# Patient Record
Sex: Female | Born: 1990 | Hispanic: Yes | Marital: Married | Smoking: Former smoker
Health system: Southern US, Community
[De-identification: ages and names within clinical notes are randomized; demographics above are authoritative.]

## PROBLEM LIST (undated history)

## (undated) ENCOUNTER — Inpatient Hospital Stay (HOSPITAL_COMMUNITY): Payer: Self-pay

## (undated) ENCOUNTER — Ambulatory Visit: Payer: No Typology Code available for payment source | Source: Home / Self Care

## (undated) DIAGNOSIS — Q513 Bicornate uterus: Secondary | ICD-10-CM

## (undated) DIAGNOSIS — N83209 Unspecified ovarian cyst, unspecified side: Secondary | ICD-10-CM

## (undated) DIAGNOSIS — S72009A Fracture of unspecified part of neck of unspecified femur, initial encounter for closed fracture: Secondary | ICD-10-CM

## (undated) DIAGNOSIS — D649 Anemia, unspecified: Secondary | ICD-10-CM

## (undated) DIAGNOSIS — I959 Hypotension, unspecified: Secondary | ICD-10-CM

## (undated) HISTORY — DX: Fracture of unspecified part of neck of unspecified femur, initial encounter for closed fracture: S72.009A

## (undated) HISTORY — DX: Bicornate uterus: Q51.3

---

## 2013-03-26 DIAGNOSIS — S72009A Fracture of unspecified part of neck of unspecified femur, initial encounter for closed fracture: Secondary | ICD-10-CM

## 2013-03-26 HISTORY — DX: Fracture of unspecified part of neck of unspecified femur, initial encounter for closed fracture: S72.009A

## 2013-03-26 HISTORY — PX: HIP FRACTURE SURGERY: SHX118

## 2017-11-18 ENCOUNTER — Inpatient Hospital Stay (HOSPITAL_COMMUNITY): Payer: Self-pay

## 2017-11-18 ENCOUNTER — Other Ambulatory Visit: Payer: Self-pay

## 2017-11-18 ENCOUNTER — Inpatient Hospital Stay (HOSPITAL_COMMUNITY)
Admission: AD | Admit: 2017-11-18 | Discharge: 2017-11-18 | Disposition: A | Discharge status: Home / Self Care | Payer: Self-pay | Source: Ambulatory Visit | Attending: Obstetrics and Gynecology | Admitting: Obstetrics and Gynecology

## 2017-11-18 ENCOUNTER — Encounter (HOSPITAL_COMMUNITY): Payer: Self-pay | Admitting: *Deleted

## 2017-11-18 DIAGNOSIS — Z87891 Personal history of nicotine dependence: Secondary | ICD-10-CM | POA: Insufficient documentation

## 2017-11-18 DIAGNOSIS — O3401 Maternal care for unspecified congenital malformation of uterus, first trimester: Secondary | ICD-10-CM | POA: Insufficient documentation

## 2017-11-18 DIAGNOSIS — Z833 Family history of diabetes mellitus: Secondary | ICD-10-CM | POA: Insufficient documentation

## 2017-11-18 DIAGNOSIS — O210 Mild hyperemesis gravidarum: Secondary | ICD-10-CM | POA: Insufficient documentation

## 2017-11-18 DIAGNOSIS — R109 Unspecified abdominal pain: Secondary | ICD-10-CM | POA: Insufficient documentation

## 2017-11-18 DIAGNOSIS — O98811 Other maternal infectious and parasitic diseases complicating pregnancy, first trimester: Secondary | ICD-10-CM | POA: Insufficient documentation

## 2017-11-18 DIAGNOSIS — Q513 Bicornate uterus: Secondary | ICD-10-CM

## 2017-11-18 DIAGNOSIS — O3400 Maternal care for unspecified congenital malformation of uterus, unspecified trimester: Secondary | ICD-10-CM

## 2017-11-18 DIAGNOSIS — O209 Hemorrhage in early pregnancy, unspecified: Secondary | ICD-10-CM | POA: Insufficient documentation

## 2017-11-18 DIAGNOSIS — O26891 Other specified pregnancy related conditions, first trimester: Secondary | ICD-10-CM | POA: Insufficient documentation

## 2017-11-18 DIAGNOSIS — B379 Candidiasis, unspecified: Secondary | ICD-10-CM | POA: Insufficient documentation

## 2017-11-18 DIAGNOSIS — Z3A08 8 weeks gestation of pregnancy: Secondary | ICD-10-CM | POA: Insufficient documentation

## 2017-11-18 HISTORY — DX: Unspecified ovarian cyst, unspecified side: N83.209

## 2017-11-18 LAB — CBC
HEMATOCRIT: 35.1 % — AB (ref 36.0–46.0)
Hemoglobin: 11.7 g/dL — ABNORMAL LOW (ref 12.0–15.0)
MCH: 27.1 pg (ref 26.0–34.0)
MCHC: 33.3 g/dL (ref 30.0–36.0)
MCV: 81.3 fL (ref 78.0–100.0)
PLATELETS: 232 10*3/uL (ref 150–400)
RBC: 4.32 MIL/uL (ref 3.87–5.11)
RDW: 16.2 % — AB (ref 11.5–15.5)
WBC: 10 10*3/uL (ref 4.0–10.5)

## 2017-11-18 LAB — URINALYSIS, ROUTINE W REFLEX MICROSCOPIC
Bilirubin Urine: NEGATIVE
Glucose, UA: NEGATIVE mg/dL
Hgb urine dipstick: NEGATIVE
KETONES UR: NEGATIVE mg/dL
LEUKOCYTES UA: NEGATIVE
NITRITE: NEGATIVE
PROTEIN: NEGATIVE mg/dL
Specific Gravity, Urine: 1.023 (ref 1.005–1.030)
pH: 8 (ref 5.0–8.0)

## 2017-11-18 LAB — HCG, QUANTITATIVE, PREGNANCY: hCG, Beta Chain, Quant, S: 104569 m[IU]/mL — ABNORMAL HIGH (ref <5)

## 2017-11-18 LAB — WET PREP, GENITAL
CLUE CELLS WET PREP: NONE SEEN
Sperm: NONE SEEN
Trich, Wet Prep: NONE SEEN

## 2017-11-18 LAB — OB RESULTS CONSOLE ABO/RH: RH Type: POSITIVE

## 2017-11-18 LAB — POCT PREGNANCY, URINE: Preg Test, Ur: POSITIVE — AB

## 2017-11-18 LAB — ABO/RH: ABO/RH(D): O POS

## 2017-11-18 LAB — OB RESULTS CONSOLE GC/CHLAMYDIA: GC PROBE AMP, GENITAL: NEGATIVE

## 2017-11-18 NOTE — Discharge Instructions (Signed)
Get Monistat or gynezol vaginla yeast cream.  DO NOT use Vagisil. Call to begin prenatal care. Take an prenatal vitamin daily. Can take Unisom (plain) one and vitamin B6 50 mg one by mouth at night.  Safe Medications in Pregnancy   Acne: Benzoyl Peroxide Salicylic Acid  Backache/Headache: Tylenol: 2 regular strength every 4 hours OR              2 Extra strength every 6 hours  Colds/Coughs/Allergies: Benadryl (alcohol free) 25 mg every 6 hours as needed Breath right strips Claritin Cepacol throat lozenges Chloraseptic throat spray Cold-Eeze- up to three times per day Cough drops, alcohol free Flonase (by prescription only) Guaifenesin Mucinex Robitussin DM (plain only, alcohol free) Saline nasal spray/drops Sudafed (pseudoephedrine) & Actifed ** use only after [redacted] weeks gestation and if you do not have high blood pressure Tylenol Vicks Vaporub Zinc lozenges Zyrtec   Constipation: Colace Ducolax suppositories Fleet enema Glycerin suppositories Metamucil Milk of magnesia Miralax Senokot Smooth move tea  Diarrhea: Kaopectate Imodium A-D  *NO pepto Bismol  Hemorrhoids: Anusol Anusol HC Preparation H Tucks  Indigestion: Tums Maalox Mylanta Zantac  Pepcid  Insomnia: Benadryl (alcohol free) 25mg  every 6 hours as needed Tylenol PM Unisom, no Gelcaps  Leg Cramps: Tums MagGel  Nausea/Vomiting:  Bonine Dramamine Emetrol Ginger extract Sea bands Meclizine  Nausea medication to take during pregnancy:  Unisom (doxylamine succinate 25 mg tablets) Take one tablet daily at bedtime. If symptoms are not adequately controlled, the dose can be increased to a maximum recommended dose of two tablets daily (1/2 tablet in the morning, 1/2 tablet mid-afternoon and one at bedtime). Vitamin B6 100mg  tablets. Take one tablet twice a day (up to 200 mg per day).  Skin Rashes: Aveeno products Benadryl cream or 25mg  every 6 hours as needed Calamine Lotion 1%  cortisone cream  Yeast infection: Gyne-lotrimin 7 Monistat 7   **If taking multiple medications, please check labels to avoid duplicating the same active ingredients **take medication as directed on the label ** Do not exceed 4000 mg of tylenol in 24 hours **Do not take medications that contain aspirin or ibuprofen

## 2017-11-18 NOTE — MAU Note (Signed)
+  HPT 3 wks ago.  On Friday, she started spotting, started as red, then became brown. Little cramps.

## 2017-11-18 NOTE — MAU Provider Note (Signed)
History     CSN: [id number]  Arrival date and time: 11/18/17 8295   First Provider Initiated Contact with Patient 11/18/17 1144      Chief Complaint  Patient presents with  . Abdominal Pain  . Vaginal Bleeding  . Possible Pregnancy   HPI Carolyn Joseph 27 y.o. [redacted]w[redacted]d by LMP but LMP was shorter than usual.  Last intercourse was on Friday and has had vaginal bleeding that started after that.  Is not having cramping but has been having some blood - even dark blood.  OB History    Gravida  3   Para  0   Term  0   Preterm  0   AB  2   Living  0     SAB  2   TAB  0   Ectopic  0   Multiple  0   Live Births  0           Past Medical History:  Diagnosis Date  . Medical history non-contributory   . Ovarian cyst     Past Surgical History:  Procedure Laterality Date  . NO PAST SURGERIES      Family History  Problem Relation Age of Onset  . Diabetes Mother   . Diabetes Maternal Grandmother   . Asthma Neg Hx   . Cancer Neg Hx   . Heart disease Neg Hx   . Hypertension Neg Hx   . Stroke Neg Hx     Social History   Tobacco Use  . Smoking status: Former Smoker    Types: Cigarettes  . Smokeless tobacco: Never Used  . Tobacco comment: in high school  Substance Use Topics  . Alcohol use: Not Currently    Comment: 2018  . Drug use: Never    Allergies: Allergies not on file  No medications prior to admission.    Review of Systems  Constitutional: Negative for fever.  Gastrointestinal: Negative for abdominal pain, constipation, nausea and vomiting.  Genitourinary: Positive for vaginal bleeding. Negative for dysuria and vaginal discharge.   Physical Exam   Blood pressure 128/69, pulse 90, temperature 98.3 F (36.8 C), temperature source Oral, resp. rate 16, weight 66.9 kg, last menstrual period 09/22/2017, SpO2 100 %.  Physical Exam  Nursing note and vitals reviewed. Constitutional: She is oriented to person, place, and time. She appears  well-developed and well-nourished.  HENT:  Head: Normocephalic.  Eyes: EOM are normal.  Neck: Neck supple.  GI: Soft. There is tenderness. There is no rebound and no guarding.  Tender in low midline just over pubic bone  Genitourinary:  Genitourinary Comments: Speculum exam: Vagina - Mod amount of dark brown blood, no odor Cervix - No active bleeding Bimanual exam: Cervix closed Uterus mildly tender, 8-9 week size Adnexa non tender, no masses bilaterally GC/Chlam, wet prep done Chaperone present for exam.   Musculoskeletal: Normal range of motion.  Neurological: She is alert and oriented to person, place, and time.  Skin: Skin is warm and dry.  Psychiatric: She has a normal mood and affect.    MAU Course  Procedures  Results for orders placed or performed during the hospital encounter of 11/18/17 (from the past 48 hour(s))  Urinalysis, Routine w reflex microscopic     Status: Abnormal   Collection Time: 11/18/17 10:23 AM  Result Value Ref Range   Color, Urine YELLOW YELLOW   APPearance HAZY (A) CLEAR   Specific Gravity, Urine 1.023 1.005 - 1.030   pH 8.0 5.0 -  8.0   Glucose, UA NEGATIVE NEGATIVE mg/dL   Hgb urine dipstick NEGATIVE NEGATIVE   Bilirubin Urine NEGATIVE NEGATIVE   Ketones, ur NEGATIVE NEGATIVE mg/dL   Protein, ur NEGATIVE NEGATIVE mg/dL   Nitrite NEGATIVE NEGATIVE   Leukocytes, UA NEGATIVE NEGATIVE    Comment: Performed at [facility], [address] [place], [place] [address]  Pregnancy, urine POC     Status: Abnormal   Collection Time: 11/18/17 10:25 AM  Result Value Ref Range   Preg Test, Ur POSITIVE (A) NEGATIVE    Comment:        THE SENSITIVITY OF THIS METHODOLOGY IS >24 mIU/mL   RPR     Status: None   Collection Time: 11/18/17 12:10 PM  Result Value Ref Range   RPR Ser Ql Non Reactive Non Reactive    Comment: (NOTE) Performed At: [facility] [address] [place], [place] [id number] Jolene Schimke MD [id number]   CBC      Status: Abnormal   Collection Time: 11/18/17 12:10 PM  Result Value Ref Range   WBC 10.0 4.0 - 10.5 K/uL   RBC 4.32 3.87 - 5.11 MIL/uL   Hemoglobin 11.7 (L) 12.0 - 15.0 g/dL   HCT 30.8 (L) 65.7 - 84.6 %   MCV 81.3 78.0 - 100.0 fL   MCH 27.1 26.0 - 34.0 pg   MCHC 33.3 30.0 - 36.0 g/dL   RDW 96.2 (H) 95.2 - 84.1 %   Platelets 232 150 - 400 K/uL    Comment: Performed at [facility], [address] [place], [place] [address]  hCG, quantitative, pregnancy     Status: Abnormal   Collection Time: 11/18/17 12:10 PM  Result Value Ref Range   hCG, Beta Chain, Quant, S 104,569 (H) <5 mIU/mL    Comment:          GEST. AGE      CONC.  (mIU/mL)   <=1 WEEK        5 - 50     2 WEEKS       50 - 500     3 WEEKS       100 - 10,000     4 WEEKS     1,000 - 30,000     5 WEEKS     3,500 - 115,000   6-8 WEEKS     12,000 - 270,000    12 WEEKS     15,000 - 220,000        FEMALE AND NON-PREGNANT FEMALE:     LESS THAN 5 mIU/mL Performed at [facility], [address], [place], [place] [address]   ABO/Rh     Status: None   Collection Time: 11/18/17 12:10 PM  Result Value Ref Range   ABO/RH(D)      O POS Performed at [facility], [address] [place], [place] [address]   HIV antibody (routine testing) (NOT for [facility])     Status: None   Collection Time: 11/18/17 12:10 PM  Result Value Ref Range   HIV Screen 4th Generation wRfx Non Reactive Non Reactive    Comment: (NOTE) Performed At: [facility] [address] [place], [place] [id number] Jolene Schimke MD DG:[id number]   Wet prep, genital     Status: Abnormal   Collection Time: 11/18/17 12:37 PM  Result Value Ref Range   Yeast Wet Prep HPF POC PRESENT (A) NONE SEEN   Trich, Wet Prep NONE SEEN NONE SEEN   Clue Cells Wet Prep  HPF POC NONE SEEN NONE SEEN   WBC, Wet Prep HPF POC MODERATE (A) NONE SEEN   Sperm NONE SEEN     Comment: Performed at [facility], [address] [place], [place] [id number]    MDM CLINICAL DATA:  27 year old pregnant female presents with vaginal bleeding.  EDC by LMP: 06/29/2018, projecting to an expected gestational age of [redacted] weeks 1 day.  EXAM: OBSTETRIC <14 WK US AND TRANSVAGINAL OB US  TECHNIQUE: Both transabdominal and transvaginal ultrasound examinations were performed for complete evaluation of the gestation as well as the maternal uterus, adnexal regions, and pelvic cul-de-sac. Transvaginal technique was performed to assess early pregnancy.  COMPARISON:  None.  FINDINGS: Intrauterine gestational sac: Single intrauterine gestational sac appears normal in size, shape and position.  Yolk sac:  Visualized.  Embryo:  Visualized.  Cardiac Activity: Regular rate and rhythm  Heart Rate: 173 bpm  CRL:  18.4 mm   8 w   2 d                  US EDC: 06/28/2018  Subchorionic hemorrhage: Small perigestational bleed in the right lower uterine cavity measuring 2.3 x 2.1 x 2.5 cm involving less than 30% of the gestational sac circumference.  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 Small subserosal 1.7 x 1.2 x 1.9 cm left posterior fundal uterine fibroid. Right ovary measures 2.8 x 1.4 x 1.1 cm. Left ovary is seen only on the transabdominal portion of the scan and measures 4.0 x 2.6 x 2.9 cm. Left ovary contains a corpus luteum. No abnormal ovarian or adnexal masses. No abnormal free fluid in the pelvis.  IMPRESSION: 1. Single living intrauterine gestation measuring 8 weeks 2 days by crown-rump length, concordant with provided menstrual dating. Small perigestational bleed. Normal fetal cardiac activity. 2. Uterine morphology suggestive of bicornuate uterus. Pregnancy is located within the left uterine horn. 3. No ovarian or adnexal abnormality.    Assessment and Plan  IUP at 6643w2d by ultrasound Bicornate uterus Subchorionic hemorrhage - Vaginal bleeding in first  trimester Morning sickness Yeast infection -  Plan Using OTC yeast treatment as she does not have insurance yet. Get Monistat or gynezol vaginla yeast cream.  DO NOT use Vagisil. Call to begin prenatal care.  May need Adopt a Mom at the Health Department.  Given WOC and HD phone numbers. Take an prenatal vitamin daily. Can take Unisom (plain) one and vitamin B6 50 mg one by mouth at night. Expect the vaginal bleeding to resolve but it may take several days.  Pelvic rest while you have vaginal bleeding.  Aramis Zobel L Alexsia Klindt 11/18/2017, 12:40 PM

## 2017-11-19 LAB — HIV ANTIBODY (ROUTINE TESTING W REFLEX): HIV Screen 4th Generation wRfx: NONREACTIVE

## 2017-11-19 LAB — RPR: RPR: NONREACTIVE

## 2017-11-19 LAB — GC/CHLAMYDIA PROBE AMP (~~LOC~~) NOT AT ARMC
Chlamydia: NEGATIVE
Neisseria Gonorrhea: NEGATIVE

## 2017-12-26 LAB — CULTURE, OB URINE: Urine Culture, OB: NO GROWTH

## 2017-12-26 LAB — OB RESULTS CONSOLE HEPATITIS B SURFACE ANTIGEN: Hepatitis B Surface Ag: NEGATIVE

## 2017-12-26 LAB — OB RESULTS CONSOLE ANTIBODY SCREEN: Antibody Screen: NEGATIVE

## 2017-12-26 LAB — GLUCOSE, 1 HOUR: Glucose 1 Hour: 122

## 2017-12-26 LAB — DRUG SCREEN, URINE: Drug Screen, Urine: NEGATIVE

## 2017-12-26 LAB — OB RESULTS CONSOLE VARICELLA ZOSTER ANTIBODY, IGG
Varicella: IMMUNE
Varicella: IMMUNE

## 2017-12-26 LAB — OB RESULTS CONSOLE RUBELLA ANTIBODY, IGM: Rubella: IMMUNE

## 2017-12-26 LAB — HEMOGLOBIN EVAL RFX ELECTROPHORESIS: Hemoglobin Evaluation: NORMAL

## 2017-12-31 ENCOUNTER — Encounter: Payer: Self-pay | Admitting: *Deleted

## 2017-12-31 ENCOUNTER — Encounter: Payer: Self-pay | Admitting: Obstetrics and Gynecology

## 2017-12-31 NOTE — BH Specialist Note (Deleted)
Integrated Behavioral Health Initial Visit  MRN: [id number] Name: Carolyn Joseph  Number of Integrated Behavioral Health Clinician visits:: 1/6 Session Start time: ***  Session End time: *** Total time: {IBH Total Time:21014050}  Type of Service: Integrated Behavioral Health- Individual/Family Interpretor:Yes.   Interpretor Name and Language: ***, Spanish   Warm Hand Off Completed.       SUBJECTIVE: Carolyn Joseph Carolyn Joseph is a 27 y.o. female accompanied by {CHL AMB ACCOMPANIED WU:9811914782} Patient was referred by Venia Carbon, NP for Initial OB introduction to integrated behavioral health services . Patient reports the following symptoms/concerns: *** Duration of problem: ***; Severity of problem: {Mild/Moderate/Severe:20260}  OBJECTIVE: Mood: {BHH MOOD:22306} and Affect: {BHH AFFECT:22307} Risk of harm to self or others: {CHL AMB BH Suicide Current Mental Status:21022748}  LIFE CONTEXT: Family and Social: *** School/Work: *** Self-Care: *** Life Changes: Current pregnancy ***  GOALS ADDRESSED: Patient will: 1. Reduce symptoms of: {IBH Symptoms:21014056} 2. Increase knowledge and/or ability of: {IBH Patient Tools:21014057}  3. Demonstrate ability to: {IBH Goals:21014053}  INTERVENTIONS: Interventions utilized: {IBH Interventions:21014054}  Standardized Assessments completed: GAD-7 and PHQ 9  ASSESSMENT: Patient currently experiencing Supervision of *** pregnancy, ***.   Patient may benefit from Initial OB introduction to integrated behavioral health services .  PLAN: 1. Follow up with behavioral health clinician on : *** 2. Behavioral recommendations: *** 3. Referral(s): {IBH Referrals:21014055} 4. "From scale of 1-10, how likely are you to follow plan?": ***  Valetta Close McMannes, LCSW  ***

## 2018-01-02 ENCOUNTER — Encounter: Payer: Self-pay | Admitting: *Deleted

## 2018-01-09 ENCOUNTER — Ambulatory Visit (INDEPENDENT_AMBULATORY_CARE_PROVIDER_SITE_OTHER): Payer: Self-pay | Admitting: Obstetrics and Gynecology

## 2018-01-09 ENCOUNTER — Encounter: Payer: Self-pay | Admitting: Obstetrics and Gynecology

## 2018-01-09 DIAGNOSIS — O0990 Supervision of high risk pregnancy, unspecified, unspecified trimester: Secondary | ICD-10-CM | POA: Insufficient documentation

## 2018-01-09 DIAGNOSIS — O418X10 Other specified disorders of amniotic fluid and membranes, first trimester, not applicable or unspecified: Secondary | ICD-10-CM

## 2018-01-09 DIAGNOSIS — O468X1 Other antepartum hemorrhage, first trimester: Secondary | ICD-10-CM

## 2018-01-09 DIAGNOSIS — N883 Incompetence of cervix uteri: Secondary | ICD-10-CM | POA: Insufficient documentation

## 2018-01-09 MED ORDER — ASPIRIN EC 81 MG PO TBEC: 81.0000 mg | DELAYED_RELEASE_TABLET | Freq: Every day | ORAL | 1 refills | Status: DC

## 2018-01-09 NOTE — Progress Notes (Signed)
PRENATAL VISIT NOTE  Subjective:  Carolyn Joseph is a 27 y.o. G3P0020 at [redacted]w[redacted]d being seen today for ongoing prenatal care.  She is currently monitored for the following issues for this high-risk pregnancy and has Bicornate uterus complicating pregnancy; Supervision of high risk pregnancy, antepartum; Incompetent cervix; and Subchorionic hemorrhage in first trimester on their problem list.   She is a transfer from the health department due to high risk. 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  Patient reports intermittent spotting. She had a subchorionic hemorrhage on 11/18/17. Since then she has had 4-5 episodes of light spotting of light brown discharge. She has not had intercourse, since having this hemorrhage. Contractions: Not present. Vag. Bleeding: None.  Movement: Present. Denies leaking of fluid.   The following portions of the patient's history were reviewed and updated as appropriate: allergies, current medications, past family history, past medical history, past social history, past surgical history and problem list. Problem list updated.  Objective:   Vitals:   01/09/18 1355 01/09/18 1356  BP: (!) 107/55   Pulse: 84   Weight: 152 lb 4.8 oz (69.1 kg)   Height:  5\' 5"  (1.651 m)    Fetal Status: Fetal Heart Rate (bpm): 156   Movement: Present     General:  Alert, oriented and cooperative. Patient is in no acute distress.  Skin: Skin is warm and dry. No rash noted.   Cardiovascular: Normal heart rate noted  Respiratory: Normal respiratory effort, no problems with respiration noted  Abdomen: Soft, gravid, appropriate for gestational age.  Pain/Pressure: Absent     Pelvic: Cervical exam deferred        Extremities: Normal range of motion.  Edema: None  Mental Status: Normal mood and affect. Normal behavior. Normal judgment and thought content.   Assessment and Plan:  Pregnancy: G3P0020 at [redacted]w[redacted]d  1. Supervision of high risk pregnancy,  antepartum -Patient declined flu vaccine and pap smear. -[place] scheduled at 16 weeks and every 2 weeks following. Patient had a vaginal delivery at 19 weeks that was stillbirth.  -Patient started on ASA to take throughout pregnancy.  2. Incompetent cervix -[place] scheduled at 16 weeks and then every 2 weeks following due to 2 past miscarriages, one being at 19 weeks. -Records being sent over from previous clinic in [place].  3. Subchorionic hemorrhage of placenta in first trimester, single or unspecified fetus --Patient counseled on importance of returning if spotting becomes heavier or is associated with abdominal or pelvic pain.  Preterm labor symptoms and general obstetric precautions including but not limited to vaginal bleeding, contractions, leaking of fluid and fetal movement were reviewed in detail with the patient. Please refer to After Visit Summary for other counseling recommendations.  Return for High risk, MD schedule only .  Future Appointments  Date Time Provider Department Center  01/16/2018 10:45 AM WH-MFC [place] 5 WH-MFCUS MFC-US  01/30/2018 11:15 AM WH-MFC [place] 4 WH-MFCUS MFC-US    Eli Hose, Student-PA

## 2018-01-09 NOTE — Progress Notes (Signed)
   PRENATAL VISIT NOTE  Subjective:  Carolyn Joseph is a 27 y.o. G3P0020 at [redacted]w[redacted]d being seen today for ongoing prenatal care. She is a transfer of care from the HD due to high risk.  She is currently monitored for the following issues for this high-risk pregnancy and has Bicornate uterus complicating pregnancy; Supervision of high risk pregnancy, antepartum; Incompetent cervix; and Subchorionic hemorrhage in first trimester on their problem list. She is a transfer of care from the HD.   Patient reports none  Contractions: Not present. Vag. Bleeding: None.  Movement: Present. Denies leaking of fluid.   The following portions of the patient's history were reviewed and updated as appropriate: allergies, current medications, past family history, past medical history, past social history, past surgical history and problem list. Problem list updated.  Objective:   Vitals:   01/09/18 1355 01/09/18 1356  BP: (!) 107/55   Pulse: 84   Weight: 152 lb 4.8 oz (69.1 kg)   Height:  5\' 5"  (1.651 m)    Fetal Status: Fetal Heart Rate (bpm): 156   Movement: Present     General:  Alert, oriented and cooperative. Patient is in no acute distress.  Skin: Skin is warm and dry. No rash noted.   Cardiovascular: Normal heart rate noted  Respiratory: Normal respiratory effort, no problems with respiration noted  Abdomen: Soft, gravid, appropriate for gestational age.  Pain/Pressure: Absent     Pelvic: Cervical exam deferred        Extremities: Normal range of motion.  Edema: None  Mental Status: Normal mood and affect. Normal behavior. Normal judgment and thought content.   Assessment and Plan:  Pregnancy: G3P0020 at [redacted]w[redacted]d  1. Supervision of high risk pregnancy, antepartum  - Patient to sign consent to transfer records from [place].  - Patient refused pap smear today.    2. Incompetent cervix - [place] MFM OB Transvaginal; Future - Patient says she went into labor at 19 weeks. Started having  pain and went to the hospital and delivered vaginally at 19 weeks.  Serial cervical lengths starting at 16 weeks. Once records are obtained will schedule patient with MD to discuss possible cerclage if needed - ASA daily starting at 16 weeks   3. Subchorionic hemorrhage of placenta in first trimester, single or unspecified fetus  - Bleeding precautions - pelvic rest - go to MAU if bleeding worsens   There are no diagnoses linked to this encounter. Preterm labor symptoms and general obstetric precautions including but not limited to vaginal bleeding, contractions, leaking of fluid and fetal movement were reviewed in detail with the patient. Please refer to After Visit Summary for other counseling recommendations.  Return for High risk, MD schedule only .  Future Appointments  Date Time Provider Department Center  01/16/2018 10:45 AM WH-MFC [place] 5 WH-MFCUS MFC-US  01/30/2018 11:15 AM WH-MFC [place] 4 WH-MFCUS MFC-US  02/06/2018  9:35 AM Constant, Gigi Gin, MD [facility] WOC    Venia Carbon, NP

## 2018-01-11 LAB — URINE CULTURE, OB REFLEX: ORGANISM ID, BACTERIA: NO GROWTH

## 2018-01-11 LAB — CULTURE, OB URINE

## 2018-01-14 ENCOUNTER — Encounter: Payer: Self-pay | Admitting: *Deleted

## 2018-01-15 ENCOUNTER — Encounter: Payer: Self-pay | Admitting: *Deleted

## 2018-01-15 DIAGNOSIS — O0990 Supervision of high risk pregnancy, unspecified, unspecified trimester: Secondary | ICD-10-CM

## 2018-01-16 ENCOUNTER — Ambulatory Visit (HOSPITAL_COMMUNITY)
Admission: RE | Admit: 2018-01-16 | Discharge: 2018-01-16 | Disposition: A | Discharge status: Home / Self Care | Payer: Self-pay | Source: Ambulatory Visit | Attending: Obstetrics and Gynecology | Admitting: Obstetrics and Gynecology

## 2018-01-16 DIAGNOSIS — O09292 Supervision of pregnancy with other poor reproductive or obstetric history, second trimester: Secondary | ICD-10-CM | POA: Insufficient documentation

## 2018-01-16 DIAGNOSIS — O09212 Supervision of pregnancy with history of pre-term labor, second trimester: Secondary | ICD-10-CM | POA: Insufficient documentation

## 2018-01-16 DIAGNOSIS — O34592 Maternal care for other abnormalities of gravid uterus, second trimester: Secondary | ICD-10-CM | POA: Insufficient documentation

## 2018-01-16 DIAGNOSIS — Z3686 Encounter for antenatal screening for cervical length: Secondary | ICD-10-CM | POA: Insufficient documentation

## 2018-01-16 DIAGNOSIS — N883 Incompetence of cervix uteri: Secondary | ICD-10-CM

## 2018-01-16 DIAGNOSIS — Q513 Bicornate uterus: Secondary | ICD-10-CM | POA: Insufficient documentation

## 2018-01-16 DIAGNOSIS — Z3A16 16 weeks gestation of pregnancy: Secondary | ICD-10-CM | POA: Insufficient documentation

## 2018-01-18 ENCOUNTER — Encounter (HOSPITAL_COMMUNITY): Payer: Self-pay | Admitting: Obstetrics & Gynecology

## 2018-01-18 ENCOUNTER — Inpatient Hospital Stay (HOSPITAL_COMMUNITY)
Admission: AD | Admit: 2018-01-18 | Discharge: 2018-01-18 | Disposition: A | Discharge status: Home / Self Care | Payer: Self-pay | Source: Ambulatory Visit | Attending: Obstetrics & Gynecology | Admitting: Obstetrics & Gynecology

## 2018-01-18 DIAGNOSIS — R102 Pelvic and perineal pain: Secondary | ICD-10-CM

## 2018-01-18 DIAGNOSIS — Z8742 Personal history of other diseases of the female genital tract: Secondary | ICD-10-CM

## 2018-01-18 DIAGNOSIS — Z3A16 16 weeks gestation of pregnancy: Secondary | ICD-10-CM | POA: Insufficient documentation

## 2018-01-18 DIAGNOSIS — O3482 Maternal care for other abnormalities of pelvic organs, second trimester: Secondary | ICD-10-CM | POA: Insufficient documentation

## 2018-01-18 DIAGNOSIS — O26892 Other specified pregnancy related conditions, second trimester: Secondary | ICD-10-CM

## 2018-01-18 DIAGNOSIS — Z7982 Long term (current) use of aspirin: Secondary | ICD-10-CM | POA: Insufficient documentation

## 2018-01-18 DIAGNOSIS — Z8759 Personal history of other complications of pregnancy, childbirth and the puerperium: Secondary | ICD-10-CM

## 2018-01-18 DIAGNOSIS — Z87891 Personal history of nicotine dependence: Secondary | ICD-10-CM | POA: Insufficient documentation

## 2018-01-18 NOTE — MAU Note (Addendum)
Carolyn Joseph Chrystine Oiler is a 27 y.o. at [redacted]w[redacted]d here in MAU reporting: +vaginal pressure at times. Denies any complaints at the present. States she is here to get a cerclage appointment as she feels like her care is mimicking a previous loss. Discussed with Sam CNM and will come talk with patient in triage  Declining UA at this time. Awaiting Dr. Attending to come discuss care.

## 2018-01-18 NOTE — MAU Provider Note (Signed)
History     CSN: [id number]  Arrival date and time: 01/18/18 1105   First Provider Initiated Contact with Patient 01/18/18 1116      Chief Complaint  Patient presents with  . vaginal pressure   HPI  Carolyn Joseph is a 27 y.o. G3P0110 at [redacted]w[redacted]d who presents to MAU requesting cerclage placement. Patient verbalizes concern that she has been researching ideal timing for cerclage placement and her next OB appointment will not be during that window. Patient denies vaginal bleeding, abnormal vaginal discharge, cramping, pain, fever, or falls. 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  OB History    Gravida  3   Para  1   Term  0   Preterm  1   AB  1   Living  0     SAB  1   TAB  0   Ectopic  0   Multiple  0   Live Births  0           Past Medical History:  Diagnosis Date  . Hip fracture (HCC) 2015  . Medical history non-contributory   . Ovarian cyst     Past Surgical History:  Procedure Laterality Date  . HIP FRACTURE SURGERY Bilateral 2015  . NO PAST SURGERIES      Family History  Problem Relation Age of Onset  . Diabetes Mother   . Hypertension Mother   . Diabetes Maternal Grandmother   . Asthma Neg Hx   . Cancer Neg Hx   . Heart disease Neg Hx   . Stroke Neg Hx     Social History   Tobacco Use  . Smoking status: Former Smoker    Types: Cigarettes  . Smokeless tobacco: Never Used  . Tobacco comment: in high school  Substance Use Topics  . Alcohol use: Not Currently    Comment: 2018  . Drug use: Never    Allergies: Not on File  Medications Prior to Admission  Medication Sig Dispense Refill Last Dose  . aspirin EC 81 MG tablet Take 1 tablet (81 mg total) by mouth daily. 90 tablet 1     Review of Systems  Constitutional: Negative for chills, fatigue and fever.  Gastrointestinal: Negative for nausea and vomiting.  Genitourinary: Negative for  difficulty urinating, dyspareunia, dysuria and vaginal bleeding.  Musculoskeletal: Negative for back pain.  Neurological: Negative for headaches.  All other systems reviewed and are negative.  Physical Exam   Blood pressure 120/61, pulse 94, temperature 98 F (36.7 C), temperature source Oral, resp. rate 16, weight 69.4 kg, last menstrual period 09/22/2017, SpO2 99 %.  Physical Exam  Nursing note and vitals reviewed. Constitutional: She is oriented to person, place, and time. She appears well-developed and well-nourished.  Cardiovascular: Normal rate.  Respiratory: Effort normal. No respiratory distress.  GI: She exhibits no distension. There is no tenderness. There is no rebound and no guarding.  Neurological: She is alert and oriented to person, place, and time.  Skin: Skin is warm and dry.  Psychiatric: She has a normal mood and affect. Her behavior is normal. Judgment and thought content normal.    MAU Course  Procedures  MDM --Chief complaint and concerns discussed with Dr. Macon Large --Dr. Macon Large at bedside in MAU to discuss plan of care with patient  Patient Vitals for the past 24 hrs:  BP Temp Temp src Pulse Resp SpO2 Weight  01/18/18 1140 120/61  98 F (36.7 C) Oral 94 16 99 % 69.4 kg     Assessment and Plan  --27 y.o. G3P0110 at [redacted]w[redacted]d  --Per Dr. Macon Large, cerclage placement scheduled for 01/20/18 at 11:15am --Reviewed pre-op instructions with patient and FOB "Mariana Kaufman" --Discharge home in stable condition  Calvert Cantor, CNM 01/18/2018, 12:07 PM

## 2018-01-18 NOTE — Discharge Instructions (Signed)
Cerclage placement scheduled for 11:15am on 01/20/18. Please report to [facility] entrance at 9:15am. Nothing to eat or drink after Sunday night at midnight.  Cervical Cerclage Cervical cerclage is a surgical procedure to correct a cervix that opens up and thins out before pregnancy is at term (cervical insufficiency, also called incompetent cervix). This condition can cause labor to start early (prematurely). This procedure involves using stitches to sew the cervix shut during pregnancy. Your surgeon may use ultrasound equipment to help guide the procedure and monitor your baby. Ultrasound equipment uses sound waves to take images of your cervix and uterus. Your surgeon will assess these images on a monitor in the operating room. Tell a health care provider about:  Any allergies you have, especially any allergies related to prescribed medicine, stitches, or anesthetic medicines.  All medicines you are taking, including vitamins, herbs, eye drops, creams, and over-the-counter medicines. Bring a list of all of your medicines to your appointment.  Your medical history, including prior labor deliveries.  Any problems you or family members have had with anesthetic medicines.  Any blood disorders you have.  Any surgeries you have had, including prior cervical stitching.  Any medical conditions you have.  Whether you are pregnant or may be pregnant. What are the risks? Generally, this is a safe procedure. However, problems may occur, including:  Infection, such as infection of the cervix or amniotic sac.  Vaginal bleeding.  Allergic reactions to medicines.  Damage to other structures or organs, such as tearing (rupture) of membranes or cervical laceration.  Premature contractions including going into early labor and delivery.  Cervical dystocia, which occurs when the cervix is unable to dilate normally during labor.  What happens before the procedure? Staying hydrated Follow instructions  from your health care provider about hydration, which may include:  Up to 2 hours before the procedure - you may continue to drink clear liquids, such as water, clear fruit juice, black coffee, and plain tea.  Eating and drinking restrictions Follow instructions from your health care provider about eating and drinking, which may include:  8 hours before the procedure - stop eating heavy meals or foods such as meat, fried foods, or fatty foods.  6 hours before the procedure - stop eating light meals or foods, such as toast or cereal.  6 hours before the procedure - stop drinking milk or drinks that contain milk.  2 hours before the procedure - stop drinking clear liquids.  Medicines  Ask your health care provider about: ? Changing or stopping your regular medicines. This is especially important if you are taking diabetes medicines or blood thinners. ? Taking medicines such as aspirin and ibuprofen. These medicines can thin your blood. Do not take these medicines before your procedure if your health care provider instructs you not to.  You may be given antibiotic medicine to help prevent infection. General instructions  Do not put on any lotion, deodorant, or perfume.  Remove contact lenses and jewelry.  Ask your health care provider how your surgical site will be marked or identified.  You may have an exam or testing.  You may have a blood or urine sample taken.  Plan to have someone take you home from the hospital or clinic.  If you will be going home right after the procedure, plan to have someone with you for 24 hours. What happens during the procedure?  To reduce your risk of infection: ? Your health care team will wash or sanitize their hands. ?  Your skin will be washed with soap.  An IV tube will be inserted into one of your veins.  You may be given one or more of the following: ? A medicine to help you relax (sedative). ? A medicine to numb the area (local  anesthetic). ? A medicine to make you fall asleep (general anesthetic). ? A medicine that is injected into your spine to numb the area below and slightly above the injection site (spinal anesthetic). ? A medicine that is injected into an area of your body to numb everything below the injection site (regional anesthetic).  A lubricated instrument (speculum) will be inserted into your vagina. The speculum will be widened to open the walls of your vagina so your surgeon can see your cervix.  Your cervix will be grasped and tightly stitched closed (sutured). To do this, your surgeon will stitch a strong band of thread around your cervix, then the thread will be tightened to hold your cervix shut. The procedure may vary among health care providers and hospitals. What happens after the procedure?  Your blood pressure, heart rate, breathing rate, and blood oxygen level will be monitored until the medicines you were given have worn off. You will be monitored for premature contractions.  You may have light bleeding and mild cramping.  You may have to wear compression stockings. These stockings help to prevent blood clots and reduce swelling in your legs.  Do not drive for 24 hours if you received a sedative.  You may be put on bed rest.  You may be given medicine to prevent infection.  You may be given an injection of a hormone (progesterone) to prevent your uterus from tightening (contracting). Summary  Cervical cerclage is a surgical procedure that involves using stitches to sew the cervix shut during pregnancy.  Your blood pressure, heart rate, breathing rate, and blood oxygen level will be monitored until the medicines you were given have worn off. You will be monitored for premature contractions.  You may need to be on bed rest after the procedure.  Plan to have someone take you home from the hospital or clinic. This information is not intended to replace advice given to you by your  health care provider. Make sure you discuss any questions you have with your health care provider. Document Released: 02/23/2008 Document Revised: 11/04/2015 Document Reviewed: 10/27/2015 Elsevier Interactive Patient Education  [employer].

## 2018-01-20 ENCOUNTER — Encounter (HOSPITAL_COMMUNITY): Payer: Self-pay | Admitting: Anesthesiology

## 2018-01-20 ENCOUNTER — Ambulatory Visit (HOSPITAL_COMMUNITY)
Admission: AD | Admit: 2018-01-20 | Discharge: 2018-01-20 | Disposition: A | Discharge status: Home / Self Care | Payer: Self-pay | Source: Ambulatory Visit | Attending: Obstetrics and Gynecology | Admitting: Obstetrics and Gynecology

## 2018-01-20 ENCOUNTER — Encounter (HOSPITAL_COMMUNITY)
Admission: AD | Disposition: A | Discharge status: Home / Self Care | Payer: Self-pay | Source: Ambulatory Visit | Attending: Obstetrics and Gynecology

## 2018-01-20 ENCOUNTER — Ambulatory Visit (HOSPITAL_COMMUNITY): Payer: Self-pay | Admitting: Anesthesiology

## 2018-01-20 ENCOUNTER — Encounter: Payer: Self-pay | Admitting: *Deleted

## 2018-01-20 DIAGNOSIS — Q513 Bicornate uterus: Secondary | ICD-10-CM

## 2018-01-20 DIAGNOSIS — O3400 Maternal care for unspecified congenital malformation of uterus, unspecified trimester: Secondary | ICD-10-CM

## 2018-01-20 DIAGNOSIS — O3432 Maternal care for cervical incompetence, second trimester: Secondary | ICD-10-CM | POA: Insufficient documentation

## 2018-01-20 DIAGNOSIS — Z7982 Long term (current) use of aspirin: Secondary | ICD-10-CM | POA: Insufficient documentation

## 2018-01-20 DIAGNOSIS — Z3A17 17 weeks gestation of pregnancy: Secondary | ICD-10-CM | POA: Insufficient documentation

## 2018-01-20 DIAGNOSIS — N883 Incompetence of cervix uteri: Secondary | ICD-10-CM | POA: Diagnosis present

## 2018-01-20 DIAGNOSIS — Z87891 Personal history of nicotine dependence: Secondary | ICD-10-CM | POA: Insufficient documentation

## 2018-01-20 HISTORY — PX: CERVICAL CERCLAGE: SHX1329

## 2018-01-20 LAB — CBC
HCT: 35.9 % — ABNORMAL LOW (ref 36.0–46.0)
HEMOGLOBIN: 12.1 g/dL (ref 12.0–15.0)
MCH: 28.6 pg (ref 26.0–34.0)
MCHC: 33.7 g/dL (ref 30.0–36.0)
MCV: 84.9 fL (ref 80.0–100.0)
Platelets: 185 10*3/uL (ref 150–400)
RBC: 4.23 MIL/uL (ref 3.87–5.11)
RDW: 16.6 % — ABNORMAL HIGH (ref 11.5–15.5)
WBC: 9.9 10*3/uL (ref 4.0–10.5)
nRBC: 0 % (ref 0.0–0.2)

## 2018-01-20 LAB — TYPE AND SCREEN
ABO/RH(D): O POS
ANTIBODY SCREEN: NEGATIVE

## 2018-01-20 SURGERY — CERCLAGE, CERVIX, VAGINAL APPROACH
Anesthesia: Spinal | Wound class: Clean Contaminated

## 2018-01-20 MED ORDER — FENTANYL CITRATE (PF) 100 MCG/2ML IJ SOLN
25.0000 ug | INTRAMUSCULAR | Status: DC | PRN
  Administered 2018-01-20: 25 ug via INTRAVENOUS

## 2018-01-20 MED ORDER — METOCLOPRAMIDE HCL 5 MG/ML IJ SOLN: 10.0000 mg | Freq: Once | INTRAMUSCULAR | Status: DC | PRN

## 2018-01-20 MED ORDER — LACTATED RINGERS IV SOLN
INTRAVENOUS | Status: DC
  Administered 2018-01-20 (×3): via INTRAVENOUS

## 2018-01-20 MED ORDER — SOD CITRATE-CITRIC ACID 500-334 MG/5ML PO SOLN
ORAL | Status: AC
  Filled 2018-01-20: qty 15

## 2018-01-20 MED ORDER — KETOROLAC TROMETHAMINE 30 MG/ML IJ SOLN
INTRAMUSCULAR | Status: DC | PRN
  Administered 2018-01-20: 30 mg via INTRAVENOUS

## 2018-01-20 MED ORDER — HYDROCODONE-ACETAMINOPHEN 7.5-325 MG PO TABS: 1.0000 | ORAL_TABLET | Freq: Once | ORAL | Status: DC | PRN

## 2018-01-20 MED ORDER — ONDANSETRON HCL 4 MG/2ML IJ SOLN
INTRAMUSCULAR | Status: AC
  Filled 2018-01-20: qty 2

## 2018-01-20 MED ORDER — SOD CITRATE-CITRIC ACID 500-334 MG/5ML PO SOLN
30.0000 mL | Freq: Once | ORAL | Status: AC
  Administered 2018-01-20: 30 mL via ORAL

## 2018-01-20 MED ORDER — PROGESTERONE MICRONIZED 200 MG PO CAPS: ORAL_CAPSULE | ORAL | 5 refills | Status: DC

## 2018-01-20 MED ORDER — BUPIVACAINE IN DEXTROSE 0.75-8.25 % IT SOLN
INTRATHECAL | Status: DC | PRN
  Administered 2018-01-20: 9 mg via INTRATHECAL

## 2018-01-20 MED ORDER — SULINDAC 200 MG PO TABS
200.0000 mg | ORAL_TABLET | Freq: Once | ORAL | Status: AC
  Administered 2018-01-20: 200 mg via ORAL
  Filled 2018-01-20: qty 1

## 2018-01-20 MED ORDER — FENTANYL CITRATE (PF) 100 MCG/2ML IJ SOLN
INTRAMUSCULAR | Status: AC
  Filled 2018-01-20: qty 2

## 2018-01-20 MED ORDER — FENTANYL CITRATE (PF) 100 MCG/2ML IJ SOLN
50.0000 ug | Freq: Once | INTRAMUSCULAR | Status: AC
  Administered 2018-01-20: 50 ug via INTRAVENOUS
  Filled 2018-01-20: qty 2

## 2018-01-20 SURGICAL SUPPLY — 17 items
CANISTER SUCT 3000ML PPV (MISCELLANEOUS) ×3 IMPLANT
CATH ROBINSON RED A/P 16FR (CATHETERS) ×3 IMPLANT
GLOVE BIO SURGEON STRL SZ7.5 (GLOVE) ×6 IMPLANT
GOWN STRL REUS W/TWL LRG LVL3 (GOWN DISPOSABLE) ×3 IMPLANT
GOWN STRL REUS W/TWL XL LVL3 (GOWN DISPOSABLE) ×3 IMPLANT
PACK VAGINAL MINOR WOMEN LF (CUSTOM PROCEDURE TRAY) ×3 IMPLANT
PAD OB MATERNITY 4.3X12.25 (PERSONAL CARE ITEMS) ×3 IMPLANT
PAD PREP 24X48 CUFFED NSTRL (MISCELLANEOUS) ×3 IMPLANT
SUT ETHIBOND 0 (SUTURE) ×3 IMPLANT
SUT MERSILENE FIBER S 5 CTX 12 (SUTURE) ×3 IMPLANT
SUT SILK 2 0 SH (SUTURE) ×3 IMPLANT
SYR BULB IRRIGATION 50ML (SYRINGE) IMPLANT
TOWEL OR 17X24 6PK STRL BLUE (TOWEL DISPOSABLE) ×6 IMPLANT
TRAY FOLEY W/BAG SLVR 14FR (SET/KITS/TRAYS/PACK) ×3 IMPLANT
TUBING NON-CON 1/4 X 20 CONN (TUBING) ×2 IMPLANT
TUBING NON-CON 1/4 X 20' CONN (TUBING) ×1
YANKAUER SUCT BULB TIP NO VENT (SUCTIONS) ×3 IMPLANT

## 2018-01-20 NOTE — Transfer of Care (Signed)
Immediate Anesthesia Transfer of Care Note  Patient: Carolyn Joseph  Procedure(s) Performed: CERCLAGE CERVICAL (N/A )  Patient Location: PACU  Anesthesia Type:Spinal  Level of Consciousness: awake, alert  and oriented  Airway & Oxygen Therapy: Patient Spontanous Breathing  Post-op Assessment: Report given to RN and Post -op Vital signs reviewed and stable  Post vital signs: Reviewed and stable  Last Vitals:  Vitals Value Taken Time  BP 76/52 01/20/2018 12:25 PM  Temp    Pulse 81 01/20/2018 12:27 PM  Resp 13 01/20/2018 12:27 PM  SpO2 100 % 01/20/2018 12:27 PM  Vitals shown include unvalidated device data.  Last Pain:  Vitals:   01/20/18 0943  TempSrc: Oral  PainSc: 0-No pain         Complications: No apparent anesthesia complications

## 2018-01-20 NOTE — H&P (Signed)
Carolyn Joseph Chrystine Oiler is an 27 y.o. female G3P0110 IUP 17 1/7 weeks presents for cerclage placement d/t to H/O incompetent cervix. H/O SVD in the past @ 19 weeks. Denies vaginal bleeding or vaginal discharge. + FM   Menstrual History: Menarche age: 58 Patient's last menstrual period was 09/22/2017.    Past Medical History:  Diagnosis Date  . Hip fracture (HCC) 2015  . Ovarian cyst     Past Surgical History:  Procedure Laterality Date  . HIP FRACTURE SURGERY Bilateral 2015  . NO PAST SURGERIES      Family History  Problem Relation Age of Onset  . Diabetes Mother   . Hypertension Mother   . Diabetes Maternal Grandmother   . Asthma Neg Hx   . Cancer Neg Hx   . Heart disease Neg Hx   . Stroke Neg Hx     Social History:  reports that she has quit smoking. Her smoking use included cigarettes. She has never used smokeless tobacco. She reports that she drank alcohol. She reports that she does not use drugs.  Allergies: No Known Allergies  Medications Prior to Admission  Medication Sig Dispense Refill Last Dose  . aspirin EC 81 MG tablet Take 1 tablet (81 mg total) by mouth daily. 90 tablet 1     Review of Systems  Constitutional: Negative.   Respiratory: Negative.   Cardiovascular: Negative.   Gastrointestinal: Negative.   Genitourinary: Negative.     Blood pressure (!) 112/51, pulse 76, temperature 98 F (36.7 C), temperature source Oral, resp. rate 15, height 5\' 5"  (1.651 m), weight 68.9 kg, last menstrual period 09/22/2017. + FHT's by doppler Physical Exam  Constitutional: She appears well-developed and well-nourished.  Cardiovascular: Normal rate, regular rhythm and normal heart sounds.  Respiratory: Effort normal and breath sounds normal.  GI: Soft. Bowel sounds are normal.  Genitourinary:  Genitourinary Comments: Deferred to OR    Results for orders placed or performed during the hospital encounter of 01/20/18 (from the past 24 hour(s))  CBC     Status:  Abnormal   Collection Time: 01/20/18  9:55 AM  Result Value Ref Range   WBC 9.9 4.0 - 10.5 K/uL   RBC 4.23 3.87 - 5.11 MIL/uL   Hemoglobin 12.1 12.0 - 15.0 g/dL   HCT 69.6 (L) 29.5 - 28.4 %   MCV 84.9 80.0 - 100.0 fL   MCH 28.6 26.0 - 34.0 pg   MCHC 33.7 30.0 - 36.0 g/dL   RDW 13.2 (H) 44.0 - 10.2 %   Platelets 185 150 - 400 K/uL   nRBC 0.0 0.0 - 0.2 %    No results found.  Assessment/Plan: IUP 17 1/7 Incompetent cervix  Cerclage placement has been recommended and reviewed with pt. R/B and post op care have been discussed. Pt has verbalized understanding and desires to proceed.   Hermina Staggers 01/20/2018, 10:47 AM

## 2018-01-20 NOTE — Progress Notes (Signed)
Pt vomiting on floor. Pt denies the need for nausea medication post vomiting. Carmelina Dane, RN

## 2018-01-20 NOTE — Anesthesia Procedure Notes (Signed)
Spinal  Patient location during procedure: OR Start time: 01/20/2018 11:17 AM End time: 01/20/2018 11:23 AM Staffing Anesthesiologist: Mal Amabile, MD Preanesthetic Checklist Completed: patient identified, site marked, surgical consent, pre-op evaluation, timeout performed, IV checked, risks and benefits discussed and monitors and equipment checked Spinal Block Patient position: sitting Prep: DuraPrep Patient monitoring: heart rate, cardiac monitor, continuous pulse ox and blood pressure Approach: midline Location: L3-4 Injection technique: single-shot Needle Needle type: Pencan  Needle gauge: 24 G Needle length: 9 cm Needle insertion depth: 6 cm Assessment Sensory level: T8 Additional Notes Patient tolerated procedure well. Adequate sensory level.

## 2018-01-20 NOTE — Anesthesia Preprocedure Evaluation (Signed)
Anesthesia Evaluation  Patient identified by MRN, date of birth, ID band Patient awake    Reviewed: Allergy & Precautions, NPO status , Patient's Chart, lab work & pertinent test results  Airway Mallampati: II  TM Distance: >3 FB Neck ROM: Full    Dental no notable dental hx. (+) Teeth Intact   Pulmonary former smoker,    Pulmonary exam normal breath sounds clear to auscultation       Cardiovascular negative cardio ROS Normal cardiovascular exam Rhythm:Regular Rate:Normal     Neuro/Psych negative neurological ROS  negative psych ROS   GI/Hepatic negative GI ROS, Neg liver ROS,   Endo/Other  negative endocrine ROS  Renal/GU negative Renal ROS  negative genitourinary   Musculoskeletal negative musculoskeletal ROS (+)   Abdominal   Peds  Hematology negative hematology ROS (+)   Anesthesia Other Findings   Reproductive/Obstetrics (+) Pregnancy Incompetent cervix Hx/o subchorionic hemorrhage Bicornuate uterus                             Anesthesia Physical Anesthesia Plan  ASA: II  Anesthesia Plan: Spinal   Post-op Pain Management:    Induction:   PONV Risk Score and Plan: 3 and Ondansetron, Dexamethasone and Treatment may vary due to age or medical condition  Airway Management Planned: Natural Airway, Nasal Cannula and Simple Face Mask  Additional Equipment:   Intra-op Plan:   Post-operative Plan:   Informed Consent: I have reviewed the patients History and Physical, chart, labs and discussed the procedure including the risks, benefits and alternatives for the proposed anesthesia with the patient or authorized representative who has indicated his/her understanding and acceptance.   Dental advisory given  Plan Discussed with: CRNA and Surgeon  Anesthesia Plan Comments:         Anesthesia Quick Evaluation

## 2018-01-20 NOTE — Progress Notes (Signed)
Pt received to rm 311 from PACU post cerclage placement. Foley in place draining clear, yellow urine. Pt reporting abdominal cramping 7/10 on pain scale. Dr. Alysia Penna notified. Order received for fentanyl IV x1. Will continue to monitor. Carmelina Dane, RN

## 2018-01-20 NOTE — Op Note (Signed)
Preoperative diagnosis: Incompetent cervix, IUP 17 1/7 weeks  Postoperative diagnosis: Same  Procedure: Cervical cerclage  Surgeon: Casimiro Needle L. Alysia Penna, M.D.  Anesthesia: Spinal  Findings: External Os 1 cm, CL > 3 cm  Estimated blood loss: <20 cc  Specimens: None  Reason for procedure: Rynlee Merlos Portillo G3P0110 [redacted]w[redacted]d, with a H/O incompetent cervix, presents for cerclage placement  Procedure: Patient was taken to the operating room where spinal analgesia was administered. She was prepped and draped in the usual sterile fashion.  A Foley catheter was placed to drain her bladder. A timeout was performed.  The patient was in dorsal lithotomy. The above cervical findings were noted. The vagina was irrigated with saline.  A weighted speculum was placed inside the vagina.  A Deaver was used anteriorly. The cervix was grasped with an open ring  forcep. A 5 mm Mersilene band interlaced with 0 Ethibond was placed in a pursestring fashion, starting and ending at 12 o'clock.  The Mersilene was tied down and the knot secured with 3/0 silk.  The Ethibond was tied down  to the point where a red rubber cathter would not place througt the endocervical cannel.  All instrument, needle and lap counts were correct x 2. The patient was taken to recovery in stable condition.  Hermina Staggers MD 01/20/2018 12:44 PM

## 2018-01-20 NOTE — Progress Notes (Signed)
Dr. Alysia Penna aware of patient vomiting. No new orders given. Carmelina Dane, RN

## 2018-01-20 NOTE — Anesthesia Postprocedure Evaluation (Signed)
Anesthesia Post Note  Patient: Carolyn Joseph, applications  Procedure(s) Performed: CERCLAGE CERVICAL (N/A )     Patient location during evaluation: PACU Anesthesia Type: Spinal Level of consciousness: oriented and awake and alert Pain management: pain level controlled Vital Signs Assessment: post-procedure vital signs reviewed and stable Respiratory status: spontaneous breathing, respiratory function stable and nonlabored ventilation Cardiovascular status: blood pressure returned to baseline and stable Postop Assessment: no headache, no backache, no apparent nausea or vomiting, patient able to bend at knees and spinal receding Anesthetic complications: no    Last Vitals:  Vitals:   01/20/18 1315 01/20/18 1330  BP: (!) 114/58 108/66  Pulse: 73 84  Resp: 14 17  Temp: 36.9 C   SpO2: 100% 100%    Last Pain:  Vitals:   01/20/18 1321  TempSrc:   PainSc: 1    Pain Goal:    LLE Motor Response: Purposeful movement (01/20/18 1330) LLE Sensation: Tingling (01/20/18 1330) RLE Motor Response: Purposeful movement (01/20/18 1330) RLE Sensation: Tingling (01/20/18 1330)      Lanier Millon A.

## 2018-01-20 NOTE — Discharge Instructions (Signed)
Cerclage of the Cervix Cerclage of the cervix is a surgical procedure for an incompetent cervix. An incompetent cervix is a weak cervix that opens up before labor begins. Cerclage of the cervix sews the cervix closed during pregnancy.  LET YOUR CAREGIVER KNOW ABOUT:  Allergies to foods or medications. All over-the-counter, prescription, herbal, eye drops and cream medications you are using. Taking illegal drugs or drinking an excessive amount of alcohol. Any recent colds or infections. Past problems with anesthetics or novocaine. Past surgery. History of blood clots or abnormal bleeding problems. Other medical or health problems. RISKS AND COMPLICATIONS  Infection. Bleeding. Rupturing the amniotic sac (membranes). Going into early labor and delivery. Problems with the anesthesia. Infection of the amniotic sac. BEFORE THE PROCEDURE  Do not take aspirin. Do not eat or drink anything 8 hours before the procedure. Do not smoke. If you are being admitted the same day as the procedure, arrive at the hospital at least 60 minutes before the surgery or as directed. During this time, you will sign the necessary forms and get prepared for the surgery. A waiting area is available for family and friends. PROCEDURE  You will be given an IV (intravenous) and medication to relax you. You will be given a spinal for anesthesia. A stitch will be placed in and around the cervix to tighten it and keep it closed. AFTER THE PROCEDURE  You will go to a recovery room where you and the baby are monitored. Once you are awake, stable, and taking fluids well, barring other problems, you will be allowed to go home You may get progesterone to prevent uterine contractions and stabilize cervical length. Have someone drive you home and stay with you for a day or two. You may be given medications to take when you go home. HOME CARE INSTRUCTIONS  Only take over-the-counter or prescriptions medicines for pain,  discomfort or fever as directed by your caregiver. Avoid physical activities and exercise until your caregiver says it is okay. Resume your usual diet. Do not douche. Do not have sexual intercourse for one week after procedure Keep your follow up surgical and prenatal appointments with your caregiver. SEEK MEDICAL CARE IF:  You have abnormal vaginal discharge. You develop a rash. You are having problems with your medications. You become lightheaded or feel faint. SEEK IMMEDIATE MEDICAL CARE IF:  You develop vaginal bleeding. You are leaking fluid or have a gush of fluid from the vagina. You develop a temperature of 102 F (38.9 C) or higher. You pass out. You have uterine contractions. You feel the baby is not moving as much as usual or cannot feel the baby move. Document Released: 02/23/2008 Document Revised: 06/04/2011 Document Reviewed: 02/23/2008 [employer] Patient Information 2013 [employer].  

## 2018-01-21 ENCOUNTER — Encounter (HOSPITAL_COMMUNITY): Payer: Self-pay | Admitting: Obstetrics and Gynecology

## 2018-01-21 NOTE — Addendum Note (Signed)
Addendum  created 01/21/18 1410 by Lenox Ahr, CRNA   Cosign clinical note

## 2018-01-23 LAB — CYSTIC FIBROSIS GENE TEST

## 2018-01-23 LAB — SMN1 COPY NUMBER ANALYSIS (SMA CARRIER SCREENING)

## 2018-01-23 LAB — HEMOGLOBINOPATHY EVALUATION
Ferritin: 18 ng/mL (ref 15–150)
HGB A2 QUANT: 2.6 % (ref 1.8–3.2)
HGB A: 97.4 % (ref 96.4–98.8)
HGB C: 0 %
HGB VARIANT: 0 %
Hgb F Quant: 0 % (ref 0.0–2.0)
Hgb S: 0 %
Hgb Solubility: NEGATIVE

## 2018-01-30 ENCOUNTER — Encounter: Payer: Self-pay | Admitting: Obstetrics and Gynecology

## 2018-01-30 ENCOUNTER — Ambulatory Visit (INDEPENDENT_AMBULATORY_CARE_PROVIDER_SITE_OTHER): Payer: Self-pay | Admitting: Obstetrics and Gynecology

## 2018-01-30 ENCOUNTER — Ambulatory Visit (HOSPITAL_COMMUNITY)
Admission: RE | Admit: 2018-01-30 | Discharge: 2018-01-30 | Disposition: A | Discharge status: Home / Self Care | Payer: Self-pay | Source: Ambulatory Visit | Attending: Obstetrics and Gynecology | Admitting: Obstetrics and Gynecology

## 2018-01-30 ENCOUNTER — Other Ambulatory Visit (HOSPITAL_COMMUNITY): Payer: Self-pay | Admitting: *Deleted

## 2018-01-30 VITALS — BP 117/76 | HR 94 | Wt 154.4 lb

## 2018-01-30 DIAGNOSIS — O0990 Supervision of high risk pregnancy, unspecified, unspecified trimester: Secondary | ICD-10-CM

## 2018-01-30 DIAGNOSIS — Z362 Encounter for other antenatal screening follow-up: Secondary | ICD-10-CM

## 2018-01-30 DIAGNOSIS — O3432 Maternal care for cervical incompetence, second trimester: Secondary | ICD-10-CM

## 2018-01-30 DIAGNOSIS — O418X20 Other specified disorders of amniotic fluid and membranes, second trimester, not applicable or unspecified: Secondary | ICD-10-CM | POA: Insufficient documentation

## 2018-01-30 DIAGNOSIS — Q513 Bicornate uterus: Secondary | ICD-10-CM

## 2018-01-30 DIAGNOSIS — O34592 Maternal care for other abnormalities of gravid uterus, second trimester: Secondary | ICD-10-CM

## 2018-01-30 DIAGNOSIS — N883 Incompetence of cervix uteri: Secondary | ICD-10-CM

## 2018-01-30 DIAGNOSIS — O0992 Supervision of high risk pregnancy, unspecified, second trimester: Secondary | ICD-10-CM | POA: Insufficient documentation

## 2018-01-30 DIAGNOSIS — O4692 Antepartum hemorrhage, unspecified, second trimester: Secondary | ICD-10-CM

## 2018-01-30 DIAGNOSIS — Z3A18 18 weeks gestation of pregnancy: Secondary | ICD-10-CM | POA: Insufficient documentation

## 2018-01-30 DIAGNOSIS — O468X1 Other antepartum hemorrhage, first trimester: Secondary | ICD-10-CM

## 2018-01-30 DIAGNOSIS — O3402 Maternal care for unspecified congenital malformation of uterus, second trimester: Secondary | ICD-10-CM

## 2018-01-30 DIAGNOSIS — O418X10 Other specified disorders of amniotic fluid and membranes, first trimester, not applicable or unspecified: Secondary | ICD-10-CM

## 2018-01-30 DIAGNOSIS — O468X2 Other antepartum hemorrhage, second trimester: Secondary | ICD-10-CM | POA: Insufficient documentation

## 2018-01-30 NOTE — Progress Notes (Signed)
Pt reports seeing a scant amount of blood when using the restroom yesterday. Pt also reports having yellow discharge.

## 2018-01-30 NOTE — Progress Notes (Signed)
Subjective:  Carolyn Joseph Carolyn Joseph is a 27 y.o. G3P0110 at [redacted]w[redacted]d being seen today for ongoing prenatal care.  She is currently monitored for the following issues for this high-risk pregnancy and has Bicornate uterus complicating pregnancy; Supervision of high risk pregnancy, antepartum; and Incompetent cervix on their problem list.  Patient reports occ low back pain. Had episode of spotting yesterday, now resolved..  Contractions: Not present. Vag. Bleeding: Scant.  Movement: Present. Denies leaking of fluid.   The following portions of the patient's history were reviewed and updated as appropriate: allergies, current medications, past family history, past medical history, past social history, past surgical history and problem list. Problem list updated.  Objective:   Vitals:   01/30/18 1331  BP: 117/76  Pulse: 94  Weight: 154 lb 6.4 oz (70 kg)    Fetal Status: Fetal Heart Rate (bpm): 154   Movement: Present     General:  Alert, oriented and cooperative. Patient is in no acute distress.  Skin: Skin is warm and dry. No rash noted.   Cardiovascular: Normal heart rate noted  Respiratory: Normal respiratory effort, no problems with respiration noted  Abdomen: Soft, gravid, appropriate for gestational age. Pain/Pressure: Present     Pelvic:  Cervical exam performed        Extremities: Normal range of motion.  Edema: None  Mental Status: Normal mood and affect. Normal behavior. Normal judgment and thought content.   Urinalysis:      Assessment and Plan:  Pregnancy: G3P0110 at [redacted]w[redacted]d  1. Supervision of high risk pregnancy, antepartum Stable Anatomy scan today, follow up scanordered  2. Incompetent cervix Cerclage in place Continue with Prometrium. CL nl on U/S today Pt reassured in regards to spotting. Increase activity as tolerated Continue with pelvic rest  3. 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 Please refer to After Visit Summary for other counseling recommendations.  Return in about 2 weeks (around 02/13/2018) for OB visit.   Hermina Staggers, MD

## 2018-02-06 ENCOUNTER — Encounter: Payer: Self-pay | Admitting: Obstetrics and Gynecology

## 2018-02-06 ENCOUNTER — Ambulatory Visit (INDEPENDENT_AMBULATORY_CARE_PROVIDER_SITE_OTHER): Payer: Self-pay | Admitting: Obstetrics and Gynecology

## 2018-02-06 VITALS — BP 120/68 | HR 99 | Wt 159.3 lb

## 2018-02-06 DIAGNOSIS — Q513 Bicornate uterus: Secondary | ICD-10-CM

## 2018-02-06 DIAGNOSIS — O0990 Supervision of high risk pregnancy, unspecified, unspecified trimester: Secondary | ICD-10-CM

## 2018-02-06 DIAGNOSIS — Z3A19 19 weeks gestation of pregnancy: Secondary | ICD-10-CM

## 2018-02-06 DIAGNOSIS — O3402 Maternal care for unspecified congenital malformation of uterus, second trimester: Secondary | ICD-10-CM

## 2018-02-06 DIAGNOSIS — N883 Incompetence of cervix uteri: Secondary | ICD-10-CM

## 2018-02-06 NOTE — Progress Notes (Signed)
   PRENATAL VISIT NOTE  Subjective:  Carolyn Joseph is a 27 y.o. G3P0110 at 373w4d being seen today for ongoing prenatal care.  She is currently monitored for the following issues for this high-risk pregnancy and has Bicornate uterus complicating pregnancy; Supervision of high risk pregnancy, antepartum; and Incompetent cervix on their problem list.  Patient reports no complaints.  Contractions: Not present. Vag. Bleeding: None.  Movement: Present. Denies leaking of fluid.   The following portions of the patient's history were reviewed and updated as appropriate: allergies, current medications, past family history, past medical history, past social history, past surgical history and problem list. Problem list updated.  Objective:   Vitals:   02/06/18 1007  Weight: 159 lb 4.8 oz (72.3 kg)    Fetal Status:     Movement: Present     General:  Alert, oriented and cooperative. Patient is in no acute distress.  Skin: Skin is warm and dry. No rash noted.   Cardiovascular: Normal heart rate noted  Respiratory: Normal respiratory effort, no problems with respiration noted  Abdomen: Soft, gravid, appropriate for gestational age.  Pain/Pressure: Absent     Pelvic: Cervical exam deferred        Extremities: Normal range of motion.  Edema: None  Mental Status: Normal mood and affect. Normal behavior. Normal judgment and thought content.   Assessment and Plan:  Pregnancy: G3P0110 at 1473w4d  1. Supervision of high risk pregnancy, antepartum Patient is doing well without complaints Reviewed ultrasound results Patient scheduled for f/u to complete anatomy  2. Incompetent cervix S/p cerclage placement Continue prometrium   3. 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 Please  refer to After Visit Summary for other counseling recommendations.  Return in about 4 weeks (around 03/06/2018) for ROB, High risk.  Future Appointments  Date Time Provider Department Center  03/13/2018 10:15 AM [facility] US 4 [facility]    Catalina AntiguaPeggy Cambrea Kirt, MD

## 2018-02-08 LAB — AFP, SERUM, OPEN SPINA BIFIDA
AFP MOM: 0.91
AFP Value: 45.9 ng/mL
GEST. AGE ON COLLECTION DATE: 19.4 wk
Maternal Age At EDD: 25.3 yr
OSBR Risk 1 IN: 10000
TEST RESULTS AFP: NEGATIVE
WEIGHT: 159 [lb_av]

## 2018-03-06 ENCOUNTER — Encounter: Payer: Self-pay | Admitting: Obstetrics & Gynecology

## 2018-03-11 ENCOUNTER — Ambulatory Visit (INDEPENDENT_AMBULATORY_CARE_PROVIDER_SITE_OTHER): Payer: Self-pay | Admitting: Advanced Practice Midwife

## 2018-03-11 VITALS — BP 110/71 | HR 96 | Wt 164.4 lb

## 2018-03-11 DIAGNOSIS — O0990 Supervision of high risk pregnancy, unspecified, unspecified trimester: Secondary | ICD-10-CM

## 2018-03-11 DIAGNOSIS — N883 Incompetence of cervix uteri: Secondary | ICD-10-CM

## 2018-03-11 DIAGNOSIS — O3402 Maternal care for unspecified congenital malformation of uterus, second trimester: Secondary | ICD-10-CM

## 2018-03-11 DIAGNOSIS — O0992 Supervision of high risk pregnancy, unspecified, second trimester: Secondary | ICD-10-CM

## 2018-03-11 DIAGNOSIS — Q513 Bicornate uterus: Secondary | ICD-10-CM

## 2018-03-11 NOTE — Patient Instructions (Addendum)
[email or web address]   TDaP Vaccine Pregnancy Get the Whooping Cough Vaccine While You Are Pregnant (CDC)  It is important for women to get the whooping cough vaccine in the third trimester of each pregnancy. Vaccines are the best way to prevent this disease. There are 2 different whooping cough vaccines. Both vaccines combine protection against whooping cough, tetanus and diphtheria, but they are for different age groups: Tdap: for everyone 11 years or older, including pregnant women  DTaP: for children 2 months through 65 years of age  You need the whooping cough vaccine during each of your pregnancies The recommended time to get the shot is during your 27th through 36th week of pregnancy, preferably during the earlier part of this time period. The Centers for Disease Control and Prevention (CDC) recommends that pregnant women receive the whooping cough vaccine for adolescents and adults (called Tdap vaccine) during the third trimester of each pregnancy. The recommended time to get the shot is during your 27th through 36th week of pregnancy, preferably during the earlier part of this time period. This replaces the original recommendation that pregnant women get the vaccine only if they had not previously received it. The [employer] of Obstetricians and Gynecologists and the [employer] support this recommendation.  You should get the whooping cough vaccine while pregnant to pass protection to your baby frame support disabled and/or not supported in this browser  Learn why Carolyn Joseph decided to get the whooping cough vaccine in her 3rd trimester of pregnancy and how her baby girl was born with some protection against the disease. Also available on YouTube. After receiving the whooping cough vaccine, your body will create protective antibodies (proteins produced by the body to fight off diseases) and pass some of them to your baby before birth. These antibodies provide  your baby some short-term protection against whooping cough in early life. These antibodies can also protect your baby from some of the more serious complications that come along with whooping cough. Your protective antibodies are at their highest about 2 weeks after getting the vaccine, but it takes time to pass them to your baby. So the preferred time to get the whooping cough vaccine is early in your third trimester. The amount of whooping cough antibodies in your body decreases over time. That is why CDC recommends you get a whooping cough vaccine during each pregnancy. Doing so allows each of your babies to get the greatest number of protective antibodies from you. This means each of your babies will get the best protection possible against this disease.  Getting the whooping cough vaccine while pregnant is better than getting the vaccine after you give birth Whooping cough vaccination during pregnancy is ideal so your baby will have short-term protection as soon as he is born. This early protection is important because your baby will not start getting his whooping cough vaccines until he is 2 months old. These first few months of life are when your baby is at greatest risk for catching whooping cough. This is also when he's at greatest risk for having severe, potentially life-threating complications from the infection. To avoid that gap in protection, it is best to get a whooping cough vaccine during pregnancy. You will then pass protection to your baby before he is born. To continue protecting your baby, he should get whooping cough vaccines starting at 2 months old. You may never have gotten the Tdap vaccine before and did not get it during this pregnancy. If so, you  should make sure to get the vaccine immediately after you give birth, before leaving the hospital or birthing center. It will take about 2 weeks before your body develops protection (antibodies) in response to the vaccine. Once you have  protection from the vaccine, you are less likely to give whooping cough to your newborn while caring for him. But remember, your baby will still be at risk for catching whooping cough from others. A recent study looked to see how effective Tdap was at preventing whooping cough in babies whose mothers got the vaccine while pregnant or in the hospital after giving birth. The study found that getting Tdap between 27 through 36 weeks of pregnancy is 85% more effective at preventing whooping cough in babies younger than 2 months old. Blood tests cannot tell if you need a whooping cough vaccine There are no blood tests that can tell you if you have enough antibodies in your body to protect yourself or your baby against whooping cough. Even if you have been sick with whooping cough in the past or previously received the vaccine, you still should get the vaccine during each pregnancy. Breastfeeding may pass some protective antibodies onto your baby By breastfeeding, you may pass some antibodies you have made in response to the vaccine to your baby. When you get a whooping cough vaccine during your pregnancy, you will have antibodies in your breast milk that you can share with your baby as soon as your milk comes in. However, your baby will not get protective antibodies immediately if you wait to get the whooping cough vaccine until after delivering your baby. This is because it takes about 2 weeks for your body to create antibodies. Learn more about the health benefits of breastfeeding.   Glucose Tolerance Test During Pregnancy The glucose tolerance test (GTT) is a blood test used to determine if you have developed a type of diabetes during pregnancy (gestational diabetes). This is when your body does not properly process sugar (glucose) in the food you eat, resulting in high blood glucose levels. Typically, a GTT is done after you have had a 1-hour glucose test with results that indicate you possibly have  gestational diabetes. It may also be done if:  You have a history of giving birth to very large babies or have experienced repeated fetal loss (stillbirth).  You have signs and symptoms of diabetes, such as: ? Changes in your vision. ? Tingling or numbness in your hands or feet. ? Changes in hunger, thirst, and urination not otherwise explained by your pregnancy.  The GTT lasts about 3 hours. You will be given a sugar-water solution to drink at the beginning of the test. You will have blood drawn before you drink the solution and then again 1, 2, and 3 hours after you drink it. You will not be allowed to eat or drink anything else during the test. You must remain at the testing location to make sure that your blood is drawn on time. You should also avoid exercising during the test, because exercise can alter test results. How do I prepare for this test? Eat normally for 3 days prior to the GTT test, including having plenty of carbohydrate-rich foods. Do not eat or drink anything except water during the final 12 hours before the test. In addition, your health care provider may ask you to stop taking certain medicines before the test. What do the results mean? It is your responsibility to obtain your test results. Ask the lab or  department performing the test when and how you will get your results. Contact your health care provider to discuss any questions you have about your results. Range of Normal Values Ranges for normal values may vary among different labs and hospitals. You should always check with your health care provider after having lab work or other tests done to discuss whether your values are considered within normal limits. Normal levels of blood glucose are as follows:  Fasting: less than 105 mg/dL.  1 hour after drinking the solution: less than 190 mg/dL.  2 hours after drinking the solution: less than 165 mg/dL.  3 hours after drinking the solution: less than 145 mg/dL.  Some  substances can interfere with GTT results. These may include:  Blood pressure and heart failure medicines, including beta blockers, furosemide, and thiazides.  Anti-inflammatory medicines, including aspirin.  Nicotine.  Some psychiatric medicines.  Meaning of Results Outside Normal Value Ranges GTT test results that are above normal values may indicate a number of health problems, such as:  Gestational diabetes.  Acute stress response.  Cushing syndrome.  Tumors such as pheochromocytoma or glucagonoma.  Long-term kidney problems.  Pancreatitis.  Hyperthyroidism.  Current infection.  Discuss your test results with your health care provider. He or she will use the results to make a diagnosis and determine a treatment plan that is right for you. This information is not intended to replace advice given to you by your health care provider. Make sure you discuss any questions you have with your health care provider. Document Released: 09/11/2011 Document Revised: 08/18/2015 Document Reviewed: 07/17/2013 Elsevier Interactive Patient Education  [employer]2018 Elsevier Inc.

## 2018-03-11 NOTE — Progress Notes (Signed)
   PRENATAL VISIT NOTE  Subjective:  Carolyn Joseph is a 27 y.o. G3P0110 at 6710w2d being seen today for ongoing prenatal care.  She is currently monitored for the following issues for this high-risk pregnancy and has Bicornate uterus complicating pregnancy; Supervision of high risk pregnancy, antepartum; and Incompetent cervix on their problem list.  Patient reports no complaints.  Contractions: Not present. Vag. Bleeding: None.  Movement: Present. Denies leaking of fluid.    The following portions of the patient's history were reviewed and updated as appropriate: allergies, current medications, past family history, past medical history, past social history, past surgical history and problem list. Problem list updated.  Objective:   Vitals:   03/11/18 1108  BP: 110/71  Pulse: 96  Weight: 164 lb 6.4 oz (74.6 kg)    Fetal Status: Fetal Heart Rate (bpm): 148   Movement: Present     General:  Alert, oriented and cooperative. Patient is in no acute distress.  Skin: Skin is warm and dry. No rash noted.   Cardiovascular: Normal heart rate noted  Respiratory: Normal respiratory effort, no problems with respiration noted  Abdomen: Soft, gravid, appropriate for gestational age.  Pain/Pressure: Absent     Pelvic: Cervical exam deferred        Extremities: Normal range of motion.  Edema: None  Mental Status: Normal mood and affect. Normal behavior. Normal judgment and thought content.   Assessment and Plan:  Pregnancy: G3P0110 at 4110w2d  1. Supervision of high risk pregnancy, antepartum - 28 week labs NV  2. 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 Incompetent cervix - Cerclage in place - Cl Nml 11/9 - F/U US for growth and CL 12/19  Preterm labor symptoms and general obstetric precautions including but not limited to vaginal bleeding, contractions, leaking of fluid and fetal movement were reviewed in detail with the patient. Please refer to After Visit  Summary for other counseling recommendations.  No follow-ups on file.  Future Appointments  Date Time Provider Department Center  03/13/2018 10:15 AM [facility]-MFC US 4 [facility]-MFCUS MFC-US    Dorathy KinsmanVirginia Dwayna Kentner, [place]

## 2018-03-13 ENCOUNTER — Other Ambulatory Visit (HOSPITAL_COMMUNITY): Payer: Self-pay | Admitting: *Deleted

## 2018-03-13 ENCOUNTER — Ambulatory Visit (HOSPITAL_COMMUNITY)
Admission: RE | Admit: 2018-03-13 | Discharge: 2018-03-13 | Disposition: A | Discharge status: Home / Self Care | Payer: Self-pay | Source: Ambulatory Visit | Attending: Obstetrics and Gynecology | Admitting: Obstetrics and Gynecology

## 2018-03-13 DIAGNOSIS — Z362 Encounter for other antenatal screening follow-up: Secondary | ICD-10-CM | POA: Insufficient documentation

## 2018-03-13 DIAGNOSIS — O3432 Maternal care for cervical incompetence, second trimester: Secondary | ICD-10-CM | POA: Insufficient documentation

## 2018-03-13 DIAGNOSIS — Q513 Bicornate uterus: Secondary | ICD-10-CM | POA: Insufficient documentation

## 2018-03-13 DIAGNOSIS — O09292 Supervision of pregnancy with other poor reproductive or obstetric history, second trimester: Secondary | ICD-10-CM | POA: Insufficient documentation

## 2018-03-13 DIAGNOSIS — O4692 Antepartum hemorrhage, unspecified, second trimester: Secondary | ICD-10-CM

## 2018-03-13 DIAGNOSIS — O34592 Maternal care for other abnormalities of gravid uterus, second trimester: Secondary | ICD-10-CM | POA: Insufficient documentation

## 2018-03-13 DIAGNOSIS — O09212 Supervision of pregnancy with history of pre-term labor, second trimester: Secondary | ICD-10-CM | POA: Insufficient documentation

## 2018-03-13 DIAGNOSIS — Z3A24 24 weeks gestation of pregnancy: Secondary | ICD-10-CM | POA: Insufficient documentation

## 2018-04-07 ENCOUNTER — Other Ambulatory Visit: Payer: Self-pay | Admitting: *Deleted

## 2018-04-07 DIAGNOSIS — O0990 Supervision of high risk pregnancy, unspecified, unspecified trimester: Secondary | ICD-10-CM

## 2018-04-09 ENCOUNTER — Ambulatory Visit (INDEPENDENT_AMBULATORY_CARE_PROVIDER_SITE_OTHER): Payer: Self-pay | Admitting: Family Medicine

## 2018-04-09 ENCOUNTER — Other Ambulatory Visit: Payer: Self-pay

## 2018-04-09 ENCOUNTER — Encounter: Payer: Self-pay | Admitting: Family Medicine

## 2018-04-09 VITALS — BP 110/63 | HR 68 | Wt 167.4 lb

## 2018-04-09 DIAGNOSIS — O0993 Supervision of high risk pregnancy, unspecified, third trimester: Secondary | ICD-10-CM

## 2018-04-09 DIAGNOSIS — O0990 Supervision of high risk pregnancy, unspecified, unspecified trimester: Secondary | ICD-10-CM

## 2018-04-09 DIAGNOSIS — Q513 Bicornate uterus: Secondary | ICD-10-CM

## 2018-04-09 DIAGNOSIS — Z3A28 28 weeks gestation of pregnancy: Secondary | ICD-10-CM

## 2018-04-09 DIAGNOSIS — N883 Incompetence of cervix uteri: Secondary | ICD-10-CM

## 2018-04-09 DIAGNOSIS — O3403 Maternal care for unspecified congenital malformation of uterus, third trimester: Secondary | ICD-10-CM

## 2018-04-09 NOTE — Patient Instructions (Addendum)
AREA PEDIATRIC/FAMILY PRACTICE PHYSICIANS  [facility] [address] [place], [place]  [address] Phone - [phone]   Fax - [phone]  [facility] [address] [place], [place] [address] Phone - [phone]   Fax - [phone]  [facility] [address] [place], [place]  [address] Phone - [phone]   Fax - [phone]  [facility] [address] [place], [place]  [address] Phone - [phone]   Fax - [phone]  [facility] [address] [place], [place]  [address] Phone - [phone]   Fax - [phone]  [facility] [address], [address] [place], [place]  [address] Phone - [phone]   Fax - [phone]  [facility] [address], [address] [place], [place]  [address] Phone - [phone]   Fax - [phone]  [facility] [address] [place], [place]  [address] Phone - [phone]   Fax - [phone]  [facility] [address] [place], [place]  [address] Phone - [phone]   Fax - [phone] [facility] [address] [place], [place]  [address] Phone - [phone]   Fax - [phone]  [facility] [address] [place], [place]  [address] Phone - [phone]   Fax - [phone]  [facility] [address], [address] [place], [place]  [address] Phone - [phone]   Fax - [phone]  [facility] [address] [place], [place]  [address] Phone - [phone]   Fax - [phone]  [facility] [address], [address] [place] [place], [place]  [address] Phone - [phone]  [facility] [address] [place], [place]  [address] Phone - [phone]   Fax - [phone]  [facility] [address], [address] [place], [place]  [address] Phone - [phone]   Fax - [phone]  [facility] [address] [place], [place]  [address] Phone - [phone]   Fax - [phone]  [facility] [address] [place], [place]  [address] Phone - [phone]   Fax - [phone]   [facility] [address], [address] [place], [place]  [address] Phone - [phone]   Fax - [phone]  [facility] [address] [place], [place]  [address] Phone - [phone]   Fax - [phone]  [facility] RUBIN [address] [place], [place]  [address] Phone - [phone]   Fax - [phone]  [facility] [address] [place], [place]  [address] Phone - [phone]   Fax - [phone]  [facility] [address] [place] , [place]  [address] Phone - [phone]   Fax - [phone] [facility] [place] [address] [place], [place]  [address] Phone - [phone]   Fax - [phone]  [facility] [address] [place], [place]  [address] Phone - [phone]   Fax - [phone]  [facility] [place] [address] [place], [place]  [address] Phone - [phone]   Fax - [phone]  [facility] [place] Wyvonne Lenzharlene Flemming MD [address] [place] [address] Phone [phone]  Fax [phone]    BENEFITS OF BREASTFEEDING Many women wonder if they should breastfeed. Research shows that  breast milk contains the perfect balance of vitamins, protein and fat that your baby needs to grow. It also contains antibodies that help your baby's immune system to fight off viruses and bacteria and can reduce the risk of sudden infant death syndrome (SIDS). In addition, the colostrum (a fluid secreted from the breast in the first few days after delivery) helps your newborn's digestive system to  grow and function well. Breast milk is easier to digest than formula. Also, if your baby is born preterm, breast milk can help to reduce both short- and long-term health problems. BENEFITS OF BREASTFEEDING FOR MOM . Breastfeeding causes a hormone to be released that helps the uterus to contract and return to its normal size more quickly. . It aids in postpartum weight loss, reduces risk of breast and ovarian cancer, heart disease and rheumatoid arthritis. . It decreases the amount of bleeding after the baby is born. benefits of breastfeeding for baby . Provides comfort and nutrition . Protects baby against - Obesity - Diabetes - Asthma - Childhood cancers - Heart disease - Ear infections - Diarrhea - Pneumonia - Stomach problems - Serious allergies - Skin rashes . Promotes growth and development . Reduces the risk of baby having Sudden Infant Death Syndrome (SIDS) only breastmilk for the first 6 months . Protects baby against diseases/allergies . It's the perfect amount for tiny bellies . It restores baby's energy . Provides the best nutrition for baby . Giving water or formula can make baby more likely to get sick, decrease Mom's milk supply, make baby less content with breastfeeding Skin to Skin After delivery, the staff will place your baby on your chest. This helps with the following: . Regulates baby's temperature, breathing, heart rate and blood sugar . Increases Mom's milk supply . Promotes bonding . Keeps baby and Mom calm and decreases baby's crying Rooming In Your baby will stay in your room with you for the entire time you are in the hospital. This helps with the following: . Allows Mom to learn baby's feeding cues - Fluttering eyes - Sucking on tongue or hand - Rooting (opens mouth and turns head) - Nuzzling into the breast - Bringing hand to mouth . Allows breastfeeding on demand (when your baby is ready) . Helps baby to be calm and content . Ensures a good  milk supply . Prevents complications with breastfeeding . Allows parents to learn to care for baby . Allows you to request assistance with breastfeeding Importance of a good latch . Increases milk transfer to baby - baby gets enough milk . Ensures you have enough milk for your baby . Decreases nipple soreness . Don't use pacifiers and bottles - these cause baby to suck differently than breastfeeding . Promotes continuation of breastfeeding Risks of Formula Supplementation with Breastfeeding Giving your infant formula in addition to your breast-milk EXCEPT when medically necessary can lead to: Marland Kitchen. Decreases your milk supply  . Loss of confidence in yourself for providing baby's nutrition  . Engorgement and possibly mastitis  . Asthma & allergies in the baby BREASTFEEDING FAQS How long should I breastfeed my baby? It is recommended that you provide your baby with breast milk only for the first 6 months and then continue for the first year and longer as desired. During the first few weeks after birth, your baby will need to feed 8-12 times every 24 hours, or every 2-3 hours. They will likely feed for 15-30 minutes. How can I help my baby begin breastfeeding? Babies  are born with an instinct to breastfeed. A healthy baby can begin breastfeeding right away without specific help. At the hospital, a nurse (or lactation consultant) will help you begin the process and will give you tips on good positioning. It may be helpful to take a breastfeeding class before you deliver in order to know what to expect. How can I help my baby latch on? In order to assist your baby in latching-on, cup your breast in your hand and stroke your baby's lower lip with your nipple to stimulate your baby's rooting reflex. Your baby will look like he or she is yawning, at which point you should bring the baby towards your breast, while aiming the nipple at the roof of his or her mouth. Remember to bring the baby towards you and  not your breast towards the baby. How can I tell if my baby is latched-on? Your baby will have all of your nipple and part of the dark area around the nipple in his or her mouth and your baby's nose will be touching your breast. You should see or hear the baby swallowing. If the baby is not latched-on properly, start the process over. To remove the suction, insert a clean finger between your breast and the baby's mouth. Should I switch breasts during feeding? After feeding on one side, switch the baby to your other breast. If he or she does not continue feeding - that is OK. Your baby will not necessarily need to feed from both breasts in a single feeding. On the next feeding, start with the other breast for efficiency and comfort. How can I tell if my baby is hungry? When your baby is hungry, they will nuzzle against your breast, make sucking noises and tongue motions and may put their hands near their mouth. Crying is a late sign of hunger, so you should not wait until this point. When they have received enough milk, they will unlatch from the breast. Is it okay to use a pacifier? Until your baby gets the hang of breastfeeding, experts recommend limiting pacifier usage. If you have questions about this, please contact your pediatrician. What can I do to ensure proper nutrition while breastfeeding? . Make sure that you support your own health and your baby's by eating a healthy, well-balanced diet . Your provider may recommend that you continue to take your prenatal vitamin . Drink plenty of fluids. It is a good rule to drink one glass of water before or after feeding . Alcohol will remain in the breast milk for as long as it will remain in the blood stream. If you choose to have a drink, it is recommended that you wait at least 2 hours before feeding . Moderate amounts of caffeine are OK . Some over-the-counter or prescription medications are not recommended during breastfeeding. Check with your  provider if you have questions What types of birth control methods are safe while breastfeeding? Progestin-only methods, including a daily pill, an IUD, the implant and the injection are safe while breastfeeding. Methods that contain estrogen (such as combination birth control pills, the vaginal ring and the patch) should not be used during the first month of breastfeeding as these can decrease your milk supply.

## 2018-04-09 NOTE — Progress Notes (Signed)
   PRENATAL VISIT NOTE  Subjective:  Carolyn Joseph is a 28 y.o. G3P0110 at [redacted]w[redacted]d being seen today for ongoing prenatal care.  She is currently monitored for the following issues for this high-risk pregnancy and has Bicornate uterus complicating pregnancy; Supervision of high risk pregnancy, antepartum; and Incompetent cervix on their problem list.  Patient reports no complaints.  Contractions: Not present. Vag. Bleeding: None.  Movement: Present. Denies leaking of fluid.   The following portions of the patient's history were reviewed and updated as appropriate: allergies, current medications, past family history, past medical history, past social history, past surgical history and problem list. Problem list updated.  Objective:   Vitals:   04/09/18 0845  BP: 110/63  Pulse: 68  Weight: 167 lb 6.4 oz (75.9 kg)    Fetal Status: Fetal Heart Rate (bpm): 132 Fundal Height: 26 cm Movement: Present     General:  Alert, oriented and cooperative. Patient is in no acute distress.  Skin: Skin is warm and dry. No rash noted.   Cardiovascular: Normal heart rate noted  Respiratory: Normal respiratory effort, no problems with respiration noted  Abdomen: Soft, gravid, appropriate for gestational age.  Pain/Pressure: Absent     Pelvic: Cervical exam deferred        Extremities: Normal range of motion.  Edema: Trace  Mental Status: Normal mood and affect. Normal behavior. Normal judgment and thought content.   Assessment and Plan:  Pregnancy: G3P0110 at [redacted]w[redacted]d  1. Incompetent cervix S/p cerclage Continue Prometrium  2. Uterus bicornis affecting pregnancy in third trimester   3. Supervision of high risk pregnancy, antepartum 28 wk labs today Declined TDaP today--may get it in the next few visits  Preterm labor symptoms and general obstetric precautions including but not limited to vaginal bleeding, contractions, leaking of fluid and fetal movement were reviewed in detail with the  patient. Please refer to After Visit Summary for other counseling recommendations.  Return in 2 weeks (on 04/23/2018).  Future Appointments  Date Time Provider Department Center  04/10/2018 10:30 AM [facility] [place] 1 [facility]    Reva Bores, MD

## 2018-04-10 ENCOUNTER — Other Ambulatory Visit (HOSPITAL_COMMUNITY): Payer: Self-pay | Admitting: *Deleted

## 2018-04-10 ENCOUNTER — Ambulatory Visit (HOSPITAL_COMMUNITY)
Admission: RE | Admit: 2018-04-10 | Discharge: 2018-04-10 | Disposition: A | Discharge status: Home / Self Care | Payer: Self-pay | Source: Ambulatory Visit | Attending: Family Medicine | Admitting: Family Medicine

## 2018-04-10 ENCOUNTER — Encounter (HOSPITAL_COMMUNITY): Payer: Self-pay

## 2018-04-10 DIAGNOSIS — O3432 Maternal care for cervical incompetence, second trimester: Secondary | ICD-10-CM

## 2018-04-10 DIAGNOSIS — O4693 Antepartum hemorrhage, unspecified, third trimester: Secondary | ICD-10-CM

## 2018-04-10 DIAGNOSIS — O09293 Supervision of pregnancy with other poor reproductive or obstetric history, third trimester: Secondary | ICD-10-CM

## 2018-04-10 DIAGNOSIS — Z362 Encounter for other antenatal screening follow-up: Secondary | ICD-10-CM | POA: Insufficient documentation

## 2018-04-10 DIAGNOSIS — Z3A28 28 weeks gestation of pregnancy: Secondary | ICD-10-CM

## 2018-04-10 DIAGNOSIS — O09213 Supervision of pregnancy with history of pre-term labor, third trimester: Secondary | ICD-10-CM

## 2018-04-10 DIAGNOSIS — O34593 Maternal care for other abnormalities of gravid uterus, third trimester: Secondary | ICD-10-CM

## 2018-04-10 DIAGNOSIS — O403XX0 Polyhydramnios, third trimester, not applicable or unspecified: Secondary | ICD-10-CM

## 2018-04-10 LAB — GLUCOSE TOLERANCE, 2 HOURS W/ 1HR
Glucose, 1 hour: 129 mg/dL (ref 65–179)
Glucose, 2 hour: 84 mg/dL (ref 65–152)
Glucose, Fasting: 69 mg/dL (ref 65–91)

## 2018-04-10 LAB — RPR: RPR Ser Ql: NONREACTIVE

## 2018-04-10 LAB — CBC
HEMOGLOBIN: 11.9 g/dL (ref 11.1–15.9)
Hematocrit: 36.6 % (ref 34.0–46.6)
MCH: 28.3 pg (ref 26.6–33.0)
MCHC: 32.5 g/dL (ref 31.5–35.7)
MCV: 87 fL (ref 79–97)
Platelets: 198 10*3/uL (ref 150–450)
RBC: 4.21 x10E6/uL (ref 3.77–5.28)
RDW: 13.8 % (ref 11.7–15.4)
WBC: 9.8 10*3/uL (ref 3.4–10.8)

## 2018-04-10 LAB — HIV ANTIBODY (ROUTINE TESTING W REFLEX): HIV Screen 4th Generation wRfx: NONREACTIVE

## 2018-04-23 ENCOUNTER — Encounter: Payer: Self-pay | Admitting: Obstetrics & Gynecology

## 2018-05-08 ENCOUNTER — Other Ambulatory Visit (HOSPITAL_COMMUNITY): Payer: Self-pay | Admitting: *Deleted

## 2018-05-08 ENCOUNTER — Encounter (HOSPITAL_COMMUNITY): Payer: Self-pay

## 2018-05-08 ENCOUNTER — Ambulatory Visit (HOSPITAL_COMMUNITY)
Admission: RE | Admit: 2018-05-08 | Discharge: 2018-05-08 | Disposition: A | Discharge status: Home / Self Care | Payer: Self-pay | Source: Ambulatory Visit | Attending: Family Medicine | Admitting: Family Medicine

## 2018-05-08 DIAGNOSIS — O3400 Maternal care for unspecified congenital malformation of uterus, unspecified trimester: Secondary | ICD-10-CM

## 2018-05-08 DIAGNOSIS — O3433 Maternal care for cervical incompetence, third trimester: Secondary | ICD-10-CM

## 2018-05-08 DIAGNOSIS — O403XX0 Polyhydramnios, third trimester, not applicable or unspecified: Secondary | ICD-10-CM | POA: Insufficient documentation

## 2018-05-08 DIAGNOSIS — Q513 Bicornate uterus: Principal | ICD-10-CM

## 2018-05-08 DIAGNOSIS — O09293 Supervision of pregnancy with other poor reproductive or obstetric history, third trimester: Secondary | ICD-10-CM

## 2018-05-08 DIAGNOSIS — Z3A32 32 weeks gestation of pregnancy: Secondary | ICD-10-CM

## 2018-05-08 DIAGNOSIS — O09213 Supervision of pregnancy with history of pre-term labor, third trimester: Secondary | ICD-10-CM

## 2018-05-15 ENCOUNTER — Ambulatory Visit (INDEPENDENT_AMBULATORY_CARE_PROVIDER_SITE_OTHER): Payer: Self-pay | Admitting: Obstetrics & Gynecology

## 2018-05-15 ENCOUNTER — Encounter: Payer: Self-pay | Admitting: Obstetrics & Gynecology

## 2018-05-15 ENCOUNTER — Other Ambulatory Visit: Payer: Self-pay

## 2018-05-15 VITALS — BP 120/75 | HR 84 | Wt 175.9 lb

## 2018-05-15 DIAGNOSIS — N883 Incompetence of cervix uteri: Secondary | ICD-10-CM

## 2018-05-15 DIAGNOSIS — B373 Candidiasis of vulva and vagina: Secondary | ICD-10-CM

## 2018-05-15 DIAGNOSIS — O0993 Supervision of high risk pregnancy, unspecified, third trimester: Secondary | ICD-10-CM

## 2018-05-15 DIAGNOSIS — O0990 Supervision of high risk pregnancy, unspecified, unspecified trimester: Secondary | ICD-10-CM

## 2018-05-15 DIAGNOSIS — N898 Other specified noninflammatory disorders of vagina: Secondary | ICD-10-CM

## 2018-05-15 DIAGNOSIS — Z3A33 33 weeks gestation of pregnancy: Secondary | ICD-10-CM

## 2018-05-15 NOTE — Progress Notes (Signed)
Pt states is having d/c with itch but no odor, burning sensation.

## 2018-05-15 NOTE — Progress Notes (Signed)
   PRENATAL VISIT NOTE  Subjective:  Carolyn Joseph is a 28 y.o. G3P0020 at [redacted]w[redacted]d being seen today for ongoing prenatal care.  She is currently monitored for the following issues for this high-risk pregnancy and has Bicornate uterus complicating pregnancy; Supervision of high risk pregnancy, antepartum; and Incompetent cervix on their problem list.  Patient reports occasional contractions.  Contractions: Irritability. Vag. Bleeding: None.  Movement: Present. Denies leaking of fluid.   The following portions of the patient's history were reviewed and updated as appropriate: allergies, current medications, past family history, past medical history, past social history, past surgical history and problem list. Problem list updated.  Objective:   Vitals:   05/15/18 1614  BP: 120/75  Pulse: 84  Weight: 175 lb 14.4 oz (79.8 kg)    Fetal Status: Fetal Heart Rate (bpm): 146   Movement: Present     General:  Alert, oriented and cooperative. Patient is in no acute distress.  Skin: Skin is warm and dry. No rash noted.   Cardiovascular: Normal heart rate noted  Respiratory: Normal respiratory effort, no problems with respiration noted  Abdomen: Soft, gravid, appropriate for gestational age.  Pain/Pressure: Present     Pelvic: Cervical exam deferred        Extremities: Normal range of motion.  Edema: Trace  Mental Status: Normal mood and affect. Normal behavior. Normal judgment and thought content.   Assessment and Plan:  Pregnancy: G3P0020 at [redacted]w[redacted]d  1. Supervision of high risk pregnancy, antepartum Bicornuate uterus and cerclage for h/o 2nd trimester loss  2. Discharge from the vagina Vaginal burning - Cervicovaginal ancillary only( [place])  3. Incompetent cervix cerclage  Preterm labor symptoms and general obstetric precautions including but not limited to vaginal bleeding, contractions, leaking of fluid and fetal movement were reviewed in detail with the  patient. Please refer to After Visit Summary for other counseling recommendations.  Return in about 2 weeks (around 05/29/2018).  Future Appointments  Date Time Provider Department Center  05/29/2018 10:15 AM [facility] [place] 4 [facility]-US    Scheryl Darter, MD

## 2018-05-15 NOTE — Patient Instructions (Signed)
Preterm Labor and Birth Information °Pregnancy normally lasts 39-41 weeks. Preterm labor is when labor starts early. It starts before you have been pregnant for 37 whole weeks. °What are the risk factors for preterm labor? °Preterm labor is more likely to occur in women who: °· Have an infection while pregnant. °· Have a cervix that is short. °· Have gone into preterm labor before. °· Have had surgery on their cervix. °· Are younger than age 26. °· Are older than age 35. °· Are African American. °· Are pregnant with two or more babies. °· Take street drugs while pregnant. °· Smoke while pregnant. °· Do not gain enough weight while pregnant. °· Got pregnant right after another pregnancy. °What are the symptoms of preterm labor? °Symptoms of preterm labor include: °· Cramps. The cramps may feel like the cramps some women get during their period. The cramps may happen with watery poop (diarrhea). °· Pain in the belly (abdomen). °· Pain in the lower back. °· Regular contractions or tightening. It may feel like your belly is getting tighter. °· Pressure in the lower belly that seems to get stronger. °· More fluid (discharge) leaking from the vagina. The fluid may be watery or bloody. °· Water breaking. °Why is it important to notice signs of preterm labor? °Babies who are born early may not be fully developed. They have a higher chance for: °· Long-term heart problems. °· Long-term lung problems. °· Trouble controlling body systems, like breathing. °· Bleeding in the brain. °· A condition called cerebral palsy. °· Learning difficulties. °· Death. °These risks are highest for babies who are born before 34 weeks of pregnancy. °How is preterm labor treated? °Treatment depends on: °· How long you were pregnant. °· Your condition. °· The health of your baby. °Treatment may involve: °· Having a stitch (suture) placed in your cervix. When you give birth, your cervix opens so the baby can come out. The stitch keeps the cervix  from opening too soon. °· Staying at the hospital. °· Taking or getting medicines, such as: °? Hormone medicines. °? Medicines to stop contractions. °? Medicines to help the baby’s lungs develop. °? Medicines to prevent your baby from having cerebral palsy. °What should I do if I am in preterm labor? °If you think you are going into labor too soon, call your doctor right away. °How can I prevent preterm labor? °· Do not use any tobacco products. °? Examples of these are cigarettes, chewing tobacco, and e-cigarettes. °? If you need help quitting, ask your doctor. °· Do not use street drugs. °· Do not use any medicines unless you ask your doctor if they are safe for you. °· Talk with your doctor before taking any herbal supplements. °· Make sure you gain enough weight. °· Watch for infection. If you think you might have an infection, get it checked right away. °· If you have gone into preterm labor before, tell your doctor. °This information is not intended to replace advice given to you by your health care provider. Make sure you discuss any questions you have with your health care provider. °Document Released: 06/08/2008 Document Revised: 08/23/2015 Document Reviewed: 08/03/2015 °Elsevier Interactive Patient Education © 2019 Elsevier Inc[employer] ° °

## 2018-05-16 LAB — CERVICOVAGINAL ANCILLARY ONLY
BACTERIAL VAGINITIS: NEGATIVE
Candida vaginitis: POSITIVE — AB

## 2018-05-16 LAB — POCT URINALYSIS DIP (DEVICE)
Bilirubin Urine: NEGATIVE
Glucose, UA: NEGATIVE mg/dL
Hgb urine dipstick: NEGATIVE
Ketones, ur: NEGATIVE mg/dL
Leukocytes,Ua: NEGATIVE
NITRITE: NEGATIVE
PROTEIN: NEGATIVE mg/dL
Specific Gravity, Urine: 1.02 (ref 1.005–1.030)
UROBILINOGEN UA: 0.2 mg/dL (ref 0.0–1.0)
pH: 7 (ref 5.0–8.0)

## 2018-05-17 ENCOUNTER — Other Ambulatory Visit: Payer: Self-pay | Admitting: Obstetrics & Gynecology

## 2018-05-17 DIAGNOSIS — N883 Incompetence of cervix uteri: Secondary | ICD-10-CM

## 2018-05-17 MED ORDER — FLUCONAZOLE 150 MG PO TABS: 150.0000 mg | ORAL_TABLET | Freq: Once | ORAL | 0 refills | Status: AC

## 2018-05-17 NOTE — Progress Notes (Signed)
Diflucan sent to pharmacy for yeast

## 2018-05-20 ENCOUNTER — Telehealth: Payer: Self-pay

## 2018-05-20 MED ORDER — FLUCONAZOLE 150 MG PO TABS: 150.0000 mg | ORAL_TABLET | Freq: Once | ORAL | 0 refills | Status: AC

## 2018-05-20 NOTE — Telephone Encounter (Signed)
Patient called back and I advised her that her medication was sent in to the pharmacy and that she was positive for yeast. Pt verified understanding and had no questions.

## 2018-05-20 NOTE — Telephone Encounter (Signed)
LM for pt that we have results and have sent an Rx to her [facility] pharmacy on [address] and [address] if she could please give [place] a call back.

## 2018-05-20 NOTE — Telephone Encounter (Addendum)
-----   Message from Adam Phenix, MD sent at 05/17/2018  9:34 AM EST ----- Positive for candida, Diflucan rx sent to pharmacy  LM for pt that we have results and have sent an Rx to her [facility] pharmacy on [address] and [address] if she could please give [place] a call back.

## 2018-05-29 ENCOUNTER — Other Ambulatory Visit (HOSPITAL_COMMUNITY): Payer: Self-pay | Admitting: *Deleted

## 2018-05-29 ENCOUNTER — Ambulatory Visit (HOSPITAL_COMMUNITY): Payer: Self-pay | Admitting: *Deleted

## 2018-05-29 ENCOUNTER — Encounter (HOSPITAL_COMMUNITY): Payer: Self-pay

## 2018-05-29 ENCOUNTER — Other Ambulatory Visit: Payer: Self-pay | Admitting: Obstetrics & Gynecology

## 2018-05-29 ENCOUNTER — Ambulatory Visit (INDEPENDENT_AMBULATORY_CARE_PROVIDER_SITE_OTHER): Payer: Self-pay | Admitting: Obstetrics & Gynecology

## 2018-05-29 ENCOUNTER — Ambulatory Visit (HOSPITAL_COMMUNITY)
Admission: RE | Admit: 2018-05-29 | Discharge: 2018-05-29 | Disposition: A | Discharge status: Home / Self Care | Payer: Self-pay | Source: Ambulatory Visit | Attending: Family Medicine | Admitting: Family Medicine

## 2018-05-29 ENCOUNTER — Other Ambulatory Visit (HOSPITAL_COMMUNITY): Payer: Self-pay | Admitting: Obstetrics and Gynecology

## 2018-05-29 VITALS — BP 125/74 | HR 79 | Wt 177.8 lb

## 2018-05-29 VITALS — BP 136/74 | HR 89 | Wt 177.2 lb

## 2018-05-29 DIAGNOSIS — O365930 Maternal care for other known or suspected poor fetal growth, third trimester, not applicable or unspecified: Secondary | ICD-10-CM

## 2018-05-29 DIAGNOSIS — O365990 Maternal care for other known or suspected poor fetal growth, unspecified trimester, not applicable or unspecified: Secondary | ICD-10-CM

## 2018-05-29 DIAGNOSIS — O0990 Supervision of high risk pregnancy, unspecified, unspecified trimester: Secondary | ICD-10-CM

## 2018-05-29 DIAGNOSIS — O3400 Maternal care for unspecified congenital malformation of uterus, unspecified trimester: Secondary | ICD-10-CM

## 2018-05-29 DIAGNOSIS — O09213 Supervision of pregnancy with history of pre-term labor, third trimester: Secondary | ICD-10-CM

## 2018-05-29 DIAGNOSIS — Z362 Encounter for other antenatal screening follow-up: Secondary | ICD-10-CM

## 2018-05-29 DIAGNOSIS — Q513 Bicornate uterus: Principal | ICD-10-CM

## 2018-05-29 DIAGNOSIS — O0993 Supervision of high risk pregnancy, unspecified, third trimester: Secondary | ICD-10-CM

## 2018-05-29 DIAGNOSIS — O09293 Supervision of pregnancy with other poor reproductive or obstetric history, third trimester: Secondary | ICD-10-CM

## 2018-05-29 DIAGNOSIS — N883 Incompetence of cervix uteri: Secondary | ICD-10-CM

## 2018-05-29 DIAGNOSIS — O368130 Decreased fetal movements, third trimester, not applicable or unspecified: Secondary | ICD-10-CM

## 2018-05-29 DIAGNOSIS — Z3A35 35 weeks gestation of pregnancy: Secondary | ICD-10-CM

## 2018-05-29 DIAGNOSIS — O403XX0 Polyhydramnios, third trimester, not applicable or unspecified: Secondary | ICD-10-CM

## 2018-05-29 DIAGNOSIS — O3433 Maternal care for cervical incompetence, third trimester: Secondary | ICD-10-CM

## 2018-05-29 NOTE — Progress Notes (Signed)
   PRENATAL VISIT NOTE  Subjective:  Carolyn Joseph is a 28 y.o. G3P0020 at [redacted]w[redacted]d being seen today for ongoing prenatal care.  She is currently monitored for the following issues for this high-risk pregnancy and has Bicornate uterus complicating pregnancy; Supervision of high risk pregnancy, antepartum; and Incompetent cervix on their problem list.  Patient reports occasional contractions.  Contractions: Irregular. Vag. Bleeding: None.  Movement: (!) Decreased. Denies leaking of fluid.   The following portions of the patient's history were reviewed and updated as appropriate: allergies, current medications, past family history, past medical history, past social history, past surgical history and problem list. Problem list updated.  Objective:   Vitals:   05/29/18 0929  BP: 136/74  Pulse: 89  Weight: 177 lb 3.2 oz (80.4 kg)    Fetal Status: Fetal Heart Rate (bpm): 148   Movement: (!) Decreased     General:  Alert, oriented and cooperative. Patient is in no acute distress.  Skin: Skin is warm and dry. No rash noted.   Cardiovascular: Normal heart rate noted  Respiratory: Normal respiratory effort, no problems with respiration noted  Abdomen: Soft, gravid, appropriate for gestational age.  Pain/Pressure: Absent     Pelvic: Cervical exam deferred        Extremities: Normal range of motion.  Edema: Trace  Mental Status: Normal mood and affect. Normal behavior. Normal judgment and thought content.   Assessment and Plan:  Pregnancy: G3P0020 at [redacted]w[redacted]d  1. Supervision of high risk pregnancy, antepartum BPP added due to decreased FM - [place] MFM FETAL BPP WO NON STRESS; Future  2. Incompetent cervix Remove cerclage next week  Preterm labor symptoms and general obstetric precautions including but not limited to vaginal bleeding, contractions, leaking of fluid and fetal movement were reviewed in detail with the patient. Please refer to After Visit Summary for other counseling  recommendations.  Return in about 1 week (around 06/05/2018) for MD removal cerclage, [place] if available.  Future Appointments  Date Time Provider Department Center  05/29/2018 10:15 AM [facility] [place] 4 WH-M[facility]US [facility]-US    Scheryl Darter, MD

## 2018-05-29 NOTE — Patient Instructions (Signed)
Cervical Cerclage    Cervical cerclage is a surgical procedure to correct a cervix that opens up and thins out before pregnancy is at term (cervical insufficiency, also called incompetent cervix). This condition can cause labor to start early (prematurely). This procedure involves using stitches to sew the cervix shut during pregnancy.  Your surgeon may use ultrasound equipment to help guide the procedure and monitor your baby. Ultrasound equipment uses sound waves to take images of your cervix and uterus. Your surgeon will assess these images on a monitor in the operating room.  Tell a health care provider about:   Any allergies you have, especially any allergies related to prescribed medicine, stitches, or anesthetic medicines.   All medicines you are taking, including vitamins, herbs, eye drops, creams, and over-the-counter medicines. Bring a list of all of your medicines to your appointment.   Your medical history, including prior labor deliveries.   Any problems you or family members have had with anesthetic medicines.   Any blood disorders you have.   Any surgeries you have had, including prior cervical stitching.   Any medical conditions you have.   Whether you are pregnant or may be pregnant.  What are the risks?  Generally, this is a safe procedure. However, problems may occur, including:   Infection, such as infection of the cervix or amniotic sac.   Vaginal bleeding.   Allergic reactions to medicines.   Damage to other structures or organs, such as tearing (rupture) of membranes or cervical laceration.   Premature contractions including going into early labor and delivery.   Cervical dystocia, which occurs when the cervix is unable to dilate normally during labor.  What happens before the procedure?  Staying hydrated  Follow instructions from your health care provider about hydration, which may include:   Up to 2 hours before the procedure - you may continue to drink clear liquids, such as  water, clear fruit juice, black coffee, and plain tea.  Eating and drinking restrictions  Follow instructions from your health care provider about eating and drinking, which may include:   8 hours before the procedure - stop eating heavy meals or foods such as meat, fried foods, or fatty foods.   6 hours before the procedure - stop eating light meals or foods, such as toast or cereal.   6 hours before the procedure - stop drinking milk or drinks that contain milk.   2 hours before the procedure - stop drinking clear liquids.  Medicines   Ask your health care provider about:  ? Changing or stopping your regular medicines. This is especially important if you are taking diabetes medicines or blood thinners.  ? Taking medicines such as aspirin and ibuprofen. These medicines can thin your blood. Do not take these medicines before your procedure if your health care provider instructs you not to.   You may be given antibiotic medicine to help prevent infection.  General instructions   Do not put on any lotion, deodorant, or perfume.   Remove contact lenses and jewelry.   Ask your health care provider how your surgical site will be marked or identified.   You may have an exam or testing.   You may have a blood or urine sample taken.   Plan to have someone take you home from the hospital or clinic.   If you will be going home right after the procedure, plan to have someone with you for 24 hours.  What happens during the procedure?     To reduce your risk of infection:  ? Your health care team will wash or sanitize their hands.  ? Your skin will be washed with soap.   An IV tube will be inserted into one of your veins.   You may be given one or more of the following:  ? A medicine to help you relax (sedative).  ? A medicine to numb the area (local anesthetic).  ? A medicine to make you fall asleep (general anesthetic).  ? A medicine that is injected into your spine to numb the area below and slightly above the  injection site (spinal anesthetic).  ? A medicine that is injected into an area of your body to numb everything below the injection site (regional anesthetic).   A lubricated instrument (speculum) will be inserted into your vagina. The speculum will be widened to open the walls of your vagina so your surgeon can see your cervix.   Your cervix will be grasped and tightly stitched closed (sutured). To do this, your surgeon will stitch a strong band of thread around your cervix, then the thread will be tightened to hold your cervix shut.  The procedure may vary among health care providers and hospitals.  What happens after the procedure?   Your blood pressure, heart rate, breathing rate, and blood oxygen level will be monitored until the medicines you were given have worn off. You will be monitored for premature contractions.   You may have light bleeding and mild cramping.   You may have to wear compression stockings. These stockings help to prevent blood clots and reduce swelling in your legs.   Do not drive for 24 hours if you received a sedative.   You may be put on bed rest.   You may be given medicine to prevent infection.   You may be given an injection of a hormone (progesterone) to prevent your uterus from tightening (contracting).  Summary   Cervical cerclage is a surgical procedure that involves using stitches to sew the cervix shut during pregnancy.   Your blood pressure, heart rate, breathing rate, and blood oxygen level will be monitored until the medicines you were given have worn off. You will be monitored for premature contractions.   You may need to be on bed rest after the procedure.   Plan to have someone take you home from the hospital or clinic.  This information is not intended to replace advice given to you by your health care provider. Make sure you discuss any questions you have with your health care provider.  Document Released: 02/23/2008 Document Revised: 11/04/2015 Document  Reviewed: 10/27/2015  Elsevier Interactive Patient Education  2019 Elsevier Inc.

## 2018-05-29 NOTE — Procedures (Signed)
Jenah Merlos Portillo Nov 24, 1990 [redacted]w[redacted]d  Fetus A Non-Stress Test Interpretation for 05/29/18  Indication: FGR  Fetal Heart Rate A Mode: External Baseline Rate (A): 135 bpm Variability: Moderate Accelerations: 15 x 15, 10 x 10 Decelerations: None Multiple birth?: No  Uterine Activity Mode: Palpation, Toco Contraction Frequency (min): U/I Contraction Duration (sec): 20-30 Contraction Quality: Mild Resting Tone Palpated: Relaxed Resting Time: Adequate  Interpretation (Fetal Testing) Nonstress Test Interpretation: Reactive Overall Impression: Reassuring for gestational age Comments: Reviewed tracing with Dr. Judeth Cornfield

## 2018-06-04 ENCOUNTER — Encounter: Payer: Self-pay | Admitting: Obstetrics and Gynecology

## 2018-06-05 ENCOUNTER — Other Ambulatory Visit: Payer: Self-pay

## 2018-06-05 ENCOUNTER — Encounter (HOSPITAL_COMMUNITY): Payer: Self-pay

## 2018-06-05 ENCOUNTER — Other Ambulatory Visit (HOSPITAL_COMMUNITY): Payer: Self-pay | Admitting: *Deleted

## 2018-06-05 ENCOUNTER — Ambulatory Visit (HOSPITAL_COMMUNITY): Payer: Self-pay | Admitting: *Deleted

## 2018-06-05 ENCOUNTER — Ambulatory Visit (HOSPITAL_COMMUNITY)
Admission: RE | Admit: 2018-06-05 | Discharge: 2018-06-05 | Disposition: A | Discharge status: Home / Self Care | Payer: Self-pay | Source: Ambulatory Visit | Attending: Obstetrics & Gynecology | Admitting: Obstetrics & Gynecology

## 2018-06-05 ENCOUNTER — Other Ambulatory Visit (HOSPITAL_COMMUNITY): Payer: Self-pay | Admitting: Obstetrics and Gynecology

## 2018-06-05 VITALS — BP 132/83 | HR 86 | Wt 176.6 lb

## 2018-06-05 DIAGNOSIS — O403XX0 Polyhydramnios, third trimester, not applicable or unspecified: Secondary | ICD-10-CM

## 2018-06-05 DIAGNOSIS — O365990 Maternal care for other known or suspected poor fetal growth, unspecified trimester, not applicable or unspecified: Secondary | ICD-10-CM

## 2018-06-05 DIAGNOSIS — O3433 Maternal care for cervical incompetence, third trimester: Secondary | ICD-10-CM

## 2018-06-05 DIAGNOSIS — O0990 Supervision of high risk pregnancy, unspecified, unspecified trimester: Secondary | ICD-10-CM | POA: Insufficient documentation

## 2018-06-05 DIAGNOSIS — O368130 Decreased fetal movements, third trimester, not applicable or unspecified: Secondary | ICD-10-CM

## 2018-06-05 DIAGNOSIS — Z3A36 36 weeks gestation of pregnancy: Secondary | ICD-10-CM

## 2018-06-05 DIAGNOSIS — O368930 Maternal care for other specified fetal problems, third trimester, not applicable or unspecified: Secondary | ICD-10-CM

## 2018-06-05 DIAGNOSIS — O09293 Supervision of pregnancy with other poor reproductive or obstetric history, third trimester: Secondary | ICD-10-CM

## 2018-06-05 DIAGNOSIS — O09213 Supervision of pregnancy with history of pre-term labor, third trimester: Secondary | ICD-10-CM

## 2018-06-05 NOTE — Procedures (Signed)
Carolyn Joseph 16-Apr-1990 [redacted]w[redacted]d  Fetus A Non-Stress Test Interpretation for 06/05/18  Indication: IUGR  Fetal Heart Rate A Mode: External Baseline Rate (A): 135 bpm Variability: Moderate Accelerations: 15 x 15 Decelerations: Variable Multiple birth?: No  Uterine Activity Mode: Palpation, Toco Contraction Frequency (min): none Resting Tone Palpated: Relaxed Resting Time: Adequate  Interpretation (Fetal Testing) Nonstress Test Interpretation: Reactive Comments: Reviewed tracing with Dr. Judeth Cornfield

## 2018-06-06 ENCOUNTER — Encounter (HOSPITAL_COMMUNITY): Payer: Self-pay

## 2018-06-06 ENCOUNTER — Ambulatory Visit (HOSPITAL_COMMUNITY): Payer: Self-pay | Attending: Obstetrics and Gynecology | Admitting: *Deleted

## 2018-06-06 VITALS — BP 122/68 | HR 92 | Wt 178.6 lb

## 2018-06-06 DIAGNOSIS — O365930 Maternal care for other known or suspected poor fetal growth, third trimester, not applicable or unspecified: Secondary | ICD-10-CM | POA: Insufficient documentation

## 2018-06-06 DIAGNOSIS — Z3A36 36 weeks gestation of pregnancy: Secondary | ICD-10-CM | POA: Insufficient documentation

## 2018-06-06 DIAGNOSIS — O365990 Maternal care for other known or suspected poor fetal growth, unspecified trimester, not applicable or unspecified: Secondary | ICD-10-CM

## 2018-06-06 NOTE — Procedures (Signed)
Carolyn Joseph January 06, 1991 [redacted]w[redacted]d  Fetus A Non-Stress Test Interpretation for 06/06/18  Indication: IUGR  Fetal Heart Rate A Mode: External Baseline Rate (A): 140 bpm Variability: Moderate Accelerations: 15 x 15 Decelerations: None  Uterine Activity Mode: Toco Contraction Frequency (min): none noted  Interpretation (Fetal Testing) Nonstress Test Interpretation: Reactive Comments: FHR tracing rev'd by Dr. Judeth Cornfield

## 2018-06-08 ENCOUNTER — Inpatient Hospital Stay (HOSPITAL_COMMUNITY)
Admission: AD | Admit: 2018-06-08 | Discharge: 2018-06-08 | Disposition: A | Discharge status: Home / Self Care | Payer: Self-pay | Attending: Obstetrics & Gynecology | Admitting: Obstetrics & Gynecology

## 2018-06-08 ENCOUNTER — Encounter (HOSPITAL_COMMUNITY): Payer: Self-pay

## 2018-06-08 ENCOUNTER — Other Ambulatory Visit: Payer: Self-pay

## 2018-06-08 DIAGNOSIS — O3433 Maternal care for cervical incompetence, third trimester: Secondary | ICD-10-CM | POA: Insufficient documentation

## 2018-06-08 DIAGNOSIS — Z3A37 37 weeks gestation of pregnancy: Secondary | ICD-10-CM | POA: Insufficient documentation

## 2018-06-08 DIAGNOSIS — O321XX0 Maternal care for breech presentation, not applicable or unspecified: Secondary | ICD-10-CM | POA: Insufficient documentation

## 2018-06-08 DIAGNOSIS — O471 False labor at or after 37 completed weeks of gestation: Secondary | ICD-10-CM | POA: Insufficient documentation

## 2018-06-08 DIAGNOSIS — Q513 Bicornate uterus: Secondary | ICD-10-CM | POA: Insufficient documentation

## 2018-06-08 DIAGNOSIS — Z87891 Personal history of nicotine dependence: Secondary | ICD-10-CM | POA: Insufficient documentation

## 2018-06-08 DIAGNOSIS — O3403 Maternal care for unspecified congenital malformation of uterus, third trimester: Secondary | ICD-10-CM | POA: Insufficient documentation

## 2018-06-08 DIAGNOSIS — O479 False labor, unspecified: Secondary | ICD-10-CM

## 2018-06-08 NOTE — MAU Provider Note (Signed)
History    CSN: [id number] Arrival date and time: 06/08/18 1125 First Provider Initiated Contact with Patient 06/08/18 1429     Chief Complaint  Patient presents with  . Contractions  . Vaginal Discharge   HPI Carolyn Joseph is a 28yo G3P0020 at [redacted]w[redacted]d who presented to the MAU with contractions. Pregnancy has been complicated by bicornate uterus, cervical incompetence with cerclage placed out of state around 19 weeks. Patient states contractions started earlier this morning and are about every 15 minutes, lasting about 15-30 seconds. States able to talk through them, not too uncomfortable.   OB History    Gravida  3   Para  0   Term  0   Preterm  0   AB  2   Living  0     SAB  2   TAB  0   Ectopic  0   Multiple  0   Live Births  0           Past Medical History:  Diagnosis Date  . Hip fracture (HCC) 2015  . Ovarian cyst     Past Surgical History:  Procedure Laterality Date  . CERVICAL CERCLAGE N/A 01/20/2018   Procedure: CERCLAGE CERVICAL;  Surgeon: Hermina Staggers, MD;  Location: [facility] BIR[facility]ING SUITES;  Service: Gynecology;  Laterality: N/A;  . HIP FRACTURE SURGERY Bilateral 2015  . NO PAST SURGERIES      Family History  Problem Relation Age of Onset  . Diabetes Mother   . Hypertension Mother   . Diabetes Maternal Grandmother   . Asthma Neg Hx   . Cancer Neg Hx   . Heart disease Neg Hx   . Stroke Neg Hx     Social History   Tobacco Use  . Smoking status: Former Smoker    Types: Cigarettes  . Smokeless tobacco: Never Used  . Tobacco comment: in high school  Substance Use Topics  . Alcohol use: Not Currently    Comment: 2018  . Drug use: Never    Allergies: No Known Allergies  Medications Prior to Admission  Medication Sig Dispense Refill Last Dose  . Prenatal Vit-Fe Fumarate-FA (PRENATAL MULTIVITAMIN) TABS tablet Take 1 tablet by mouth daily at 12 noon.   Taking  . progesterone (PROMETRIUM) 200 MG capsule Place one capsule vaginally at  bedtime (Patient not taking: Reported on 06/05/2018) 30 capsule 5 Not Taking    Review of Systems  Constitutional: Negative for activity change, appetite change and fever.  HENT: Negative for congestion.   Eyes: Negative for visual disturbance.  Gastrointestinal: Negative for abdominal pain, nausea and vomiting.  Genitourinary: Positive for vaginal discharge. Negative for dysuria, vaginal bleeding and vaginal pain.  Neurological: Negative for headaches.   Physical Exam   Blood pressure 131/85, pulse 84, temperature 98.4 F (36.9 C), temperature source Oral, resp. rate 17, weight 80.2 kg, last menstrual period 09/22/2017, SpO2 99 %.  Physical Exam  Nursing note and vitals reviewed. Constitutional: She is oriented to person, place, and time. She appears well-developed and well-nourished. No distress.  Sitting up in bed  HENT:  Head: Normocephalic and atraumatic.  Eyes: Conjunctivae and EOM are normal. No scleral icterus.  Respiratory: Effort normal. No respiratory distress.  GI: There is no abdominal tenderness.  Soft, gravid, Leopold's breech  Genitourinary:    Genitourinary Comments: Deferred  SVE with cervix closed and cerclage does not feel tight or under increased tension   Musculoskeletal:        General: No edema.  Neurological: She is alert and oriented to person, place, and time.  Psychiatric: She has a normal mood and affect. Her behavior is normal.   MAU Course  Procedures  MDM -- reactive tracing - wondering baseline over time monitored from 130-150s/mod/+a/-d, contractions irregular -- BSUS performed, confirmed breech presentation, head in RUQ  Assessment and Plan  28yo G3P0020 at [redacted]w[redacted]d who presents with contractions. False labor and cerclage not under tension, stable for discharge home. Has cerclage removal in clinic tomorrow. Patient does not have version scheduled. Discussed with her than unlikely to be successful given bicornate uterus, > 37 weeks but  encouraged her to discuss in more detail with provider tomorrow in clinic. Reviewed return precautions, patient voiced understanding of plan.   Tamera Stands, DO 06/08/2018, 2:31 PM

## 2018-06-08 NOTE — Discharge Instructions (Signed)
·   Try to drink plenty of fluids, take a warm bath or shower and rest today.  If you notice leakage of fluids, vaginal bleeding or contractions that are more frequent (about every 2-3 minutes and lasting a minute), then you should return to the MAU.   Be sure to ask your doctor tomorrow about an external cephalic version given your baby is still breech.

## 2018-06-08 NOTE — MAU Note (Signed)
Started contracting around 2/3, around 7 got closer and little stronger (q 15-20).  Had some thick mucous d/c- with a little blood in it. No active bleeding or leaking. Still has cerclage.

## 2018-06-09 ENCOUNTER — Inpatient Hospital Stay (HOSPITAL_COMMUNITY)
Admission: AD | Admit: 2018-06-09 | Discharge: 2018-06-09 | Disposition: A | Discharge status: Home / Self Care | Payer: Self-pay | Source: Ambulatory Visit | Attending: Obstetrics & Gynecology | Admitting: Obstetrics & Gynecology

## 2018-06-09 ENCOUNTER — Encounter: Payer: Self-pay | Admitting: Obstetrics and Gynecology

## 2018-06-09 ENCOUNTER — Inpatient Hospital Stay (HOSPITAL_BASED_OUTPATIENT_CLINIC_OR_DEPARTMENT_OTHER): Payer: Self-pay

## 2018-06-09 ENCOUNTER — Other Ambulatory Visit: Payer: Self-pay

## 2018-06-09 ENCOUNTER — Inpatient Hospital Stay (HOSPITAL_COMMUNITY)
Admission: AD | Admit: 2018-06-09 | Discharge: 2018-06-09 | Disposition: A | Discharge status: Home / Self Care | Payer: Self-pay | Attending: Obstetrics and Gynecology | Admitting: Obstetrics and Gynecology

## 2018-06-09 ENCOUNTER — Ambulatory Visit (INDEPENDENT_AMBULATORY_CARE_PROVIDER_SITE_OTHER): Payer: Self-pay | Admitting: Obstetrics and Gynecology

## 2018-06-09 VITALS — BP 113/89 | HR 80 | Wt 175.0 lb

## 2018-06-09 DIAGNOSIS — Q513 Bicornate uterus: Secondary | ICD-10-CM

## 2018-06-09 DIAGNOSIS — O365990 Maternal care for other known or suspected poor fetal growth, unspecified trimester, not applicable or unspecified: Secondary | ICD-10-CM | POA: Insufficient documentation

## 2018-06-09 DIAGNOSIS — Z3A37 37 weeks gestation of pregnancy: Secondary | ICD-10-CM | POA: Insufficient documentation

## 2018-06-09 DIAGNOSIS — O329XX0 Maternal care for malpresentation of fetus, unspecified, not applicable or unspecified: Secondary | ICD-10-CM

## 2018-06-09 DIAGNOSIS — O4690 Antepartum hemorrhage, unspecified, unspecified trimester: Secondary | ICD-10-CM

## 2018-06-09 DIAGNOSIS — Z87891 Personal history of nicotine dependence: Secondary | ICD-10-CM | POA: Insufficient documentation

## 2018-06-09 DIAGNOSIS — O4693 Antepartum hemorrhage, unspecified, third trimester: Secondary | ICD-10-CM | POA: Insufficient documentation

## 2018-06-09 DIAGNOSIS — O3403 Maternal care for unspecified congenital malformation of uterus, third trimester: Secondary | ICD-10-CM

## 2018-06-09 DIAGNOSIS — O26893 Other specified pregnancy related conditions, third trimester: Secondary | ICD-10-CM | POA: Insufficient documentation

## 2018-06-09 DIAGNOSIS — Z113 Encounter for screening for infections with a predominantly sexual mode of transmission: Secondary | ICD-10-CM

## 2018-06-09 DIAGNOSIS — O4290 Premature rupture of membranes, unspecified as to length of time between rupture and onset of labor, unspecified weeks of gestation: Secondary | ICD-10-CM

## 2018-06-09 DIAGNOSIS — O0990 Supervision of high risk pregnancy, unspecified, unspecified trimester: Secondary | ICD-10-CM

## 2018-06-09 DIAGNOSIS — Z0371 Encounter for suspected problem with amniotic cavity and membrane ruled out: Secondary | ICD-10-CM

## 2018-06-09 DIAGNOSIS — O365930 Maternal care for other known or suspected poor fetal growth, third trimester, not applicable or unspecified: Secondary | ICD-10-CM

## 2018-06-09 DIAGNOSIS — O321XX0 Maternal care for breech presentation, not applicable or unspecified: Secondary | ICD-10-CM

## 2018-06-09 DIAGNOSIS — O3430 Maternal care for cervical incompetence, unspecified trimester: Secondary | ICD-10-CM | POA: Insufficient documentation

## 2018-06-09 DIAGNOSIS — O3433 Maternal care for cervical incompetence, third trimester: Secondary | ICD-10-CM

## 2018-06-09 DIAGNOSIS — Z3689 Encounter for other specified antenatal screening: Secondary | ICD-10-CM

## 2018-06-09 LAB — URINALYSIS, ROUTINE W REFLEX MICROSCOPIC
Bacteria, UA: NONE SEEN
Bilirubin Urine: NEGATIVE
Glucose, UA: NEGATIVE mg/dL
Ketones, ur: NEGATIVE mg/dL
Leukocytes,Ua: NEGATIVE
Nitrite: NEGATIVE
Protein, ur: NEGATIVE mg/dL
Specific Gravity, Urine: 1.009 (ref 1.005–1.030)
pH: 6 (ref 5.0–8.0)

## 2018-06-09 LAB — CBC
HCT: 39.7 % (ref 36.0–46.0)
Hemoglobin: 13.2 g/dL (ref 12.0–15.0)
MCH: 29.4 pg (ref 26.0–34.0)
MCHC: 33.2 g/dL (ref 30.0–36.0)
MCV: 88.4 fL (ref 80.0–100.0)
Platelets: 176 10*3/uL (ref 150–400)
RBC: 4.49 MIL/uL (ref 3.87–5.11)
RDW: 14.8 % (ref 11.5–15.5)
WBC: 10.8 10*3/uL — AB (ref 4.0–10.5)
nRBC: 0 % (ref 0.0–0.2)

## 2018-06-09 LAB — POCT FERN TEST: POCT Fern Test: NEGATIVE

## 2018-06-09 MED ORDER — TERBUTALINE SULFATE 1 MG/ML IJ SOLN
INTRAMUSCULAR | Status: AC
  Filled 2018-06-09: qty 1

## 2018-06-09 MED ORDER — TERBUTALINE SULFATE 1 MG/ML IJ SOLN
0.2500 mg | Freq: Once | INTRAMUSCULAR | Status: AC
  Administered 2018-06-09: 0.25 mg via SUBCUTANEOUS

## 2018-06-09 MED ORDER — LACTATED RINGERS IV SOLN
INTRAVENOUS | Status: DC
  Administered 2018-06-09: 12:00:00 via INTRAVENOUS

## 2018-06-09 MED ORDER — ZOLPIDEM TARTRATE 5 MG PO TABS: 5.0000 mg | ORAL_TABLET | Freq: Every evening | ORAL | 0 refills | Status: DC | PRN

## 2018-06-09 NOTE — MAU Provider Note (Signed)
History     CSN: [id number]  Arrival date and time: 06/09/18 8592   First Provider Initiated Contact with Patient 06/09/18 0606      Chief Complaint  Patient presents with  . Contractions  . Rupture of Membranes   HPI Carolyn Joseph is a 28 y.o. G3P0020 at [redacted]w[redacted]d who presents with vaginal bleeding and possible rupture of membranes. She states she woke up and felt wet. She states she saw bloody fluid. She reports contractions every 20 minutes. Reports normal fetal movement.   OB History    Gravida  3   Para  0   Term  0   Preterm  0   AB  2   Living  0     SAB  2   TAB  0   Ectopic  0   Multiple  0   Live Births  0           Past Medical History:  Diagnosis Date  . Hip fracture (HCC) 2015  . Ovarian cyst     Past Surgical History:  Procedure Laterality Date  . CERVICAL CERCLAGE N/A 01/20/2018   Procedure: CERCLAGE CERVICAL;  Surgeon: Hermina Staggers, MD;  Location: [facility] BIRTHING SUITES;  Service: Gynecology;  Laterality: N/A;  . HIP FRACTURE SURGERY Bilateral 2015  . NO PAST SURGERIES      Family History  Problem Relation Age of Onset  . Diabetes Mother   . Hypertension Mother   . Diabetes Maternal Grandmother   . Asthma Neg Hx   . Cancer Neg Hx   . Heart disease Neg Hx   . Stroke Neg Hx     Social History   Tobacco Use  . Smoking status: Former Smoker    Types: Cigarettes  . Smokeless tobacco: Never Used  . Tobacco comment: in high school  Substance Use Topics  . Alcohol use: Not Currently    Comment: 2018  . Drug use: Never    Allergies: No Known Allergies  Medications Prior to Admission  Medication Sig Dispense Refill Last Dose  . Prenatal Vit-Fe Fumarate-FA (PRENATAL MULTIVITAMIN) TABS tablet Take 1 tablet by mouth daily at 12 noon.   Taking    Review of Systems  Constitutional: Negative.  Negative for fatigue and fever.  HENT: Negative.   Respiratory: Negative.  Negative for shortness of breath.    Cardiovascular: Negative.  Negative for chest pain and palpitations.  Gastrointestinal: Negative.  Negative for abdominal pain, constipation, diarrhea, nausea and vomiting.  Genitourinary: Positive for vaginal bleeding and vaginal discharge. Negative for dysuria.  Neurological: Negative.  Negative for dizziness and headaches.   Physical Exam   Blood pressure 122/82, pulse 70, temperature 97.8 F (36.6 C), temperature source Oral, resp. rate 18, height 5\' 5"  (1.651 m), weight 79.4 kg, last menstrual period 09/22/2017.  Physical Exam  Nursing note and vitals reviewed. Constitutional: She is oriented to person, place, and time. She appears well-developed and well-nourished. No distress.  HENT:  Head: Normocephalic.  Eyes: Pupils are equal, round, and reactive to light.  Cardiovascular: Normal rate, regular rhythm and normal heart sounds.  Respiratory: Effort normal and breath sounds normal. No respiratory distress.  GI: Soft. Bowel sounds are normal. She exhibits no distension. There is no abdominal tenderness.  Genitourinary:    Genitourinary Comments: SSE: small amount of dark red blood in vault, no active bleeding, cerclage in place. No pooling   Neurological: She is alert and oriented to person, place, and time.  Skin: Skin is warm and dry.  Psychiatric: She has a normal mood and affect. Her behavior is normal. Judgment and thought content normal.   Fetal Tracing:  Baseline: 130 Variability: moderate Accels: 15x15 Decels: none  Toco: none  Dilation: Closed Effacement (%): 50 Station: -3 Exam by:: Cleone Slim CNM  MAU Course  Procedures Results for orders placed or performed during the hospital encounter of 06/09/18 (from the past 24 hour(s))  Urinalysis, Routine w reflex microscopic     Status: Abnormal   Collection Time: 06/09/18  5:31 AM  Result Value Ref Range   Color, Urine YELLOW YELLOW   APPearance CLEAR CLEAR   Specific Gravity, Urine 1.009 1.005 - 1.030    pH 6.0 5.0 - 8.0   Glucose, UA NEGATIVE NEGATIVE mg/dL   Hgb urine dipstick MODERATE (A) NEGATIVE   Bilirubin Urine NEGATIVE NEGATIVE   Ketones, ur NEGATIVE NEGATIVE mg/dL   Protein, ur NEGATIVE NEGATIVE mg/dL   Nitrite NEGATIVE NEGATIVE   Leukocytes,Ua NEGATIVE NEGATIVE   RBC / HPF 0-5 0 - 5 RBC/hpf   WBC, UA 0-5 0 - 5 WBC/hpf   Bacteria, UA NONE SEEN NONE SEEN   Squamous Epithelial / LPF 0-5 0 - 5   Mucus PRESENT   POCT fern test     Status: None   Collection Time: 06/09/18  6:42 AM  Result Value Ref Range   POCT Fern Test Negative = intact amniotic membranes    MDM UA Fern-negative [place] MFM OB Limited- AFI 15.5cm, anterior placenta, no signs of abruption  Minimal bleeding on exam. No evidence of SROM at this time.  Assessment and Plan   1. Encounter for suspected premature rupture of amniotic membranes, with rupture of membranes not found   2. [redacted] weeks gestation of pregnancy    -Discharge home in stable condition -Vaginal bleeding and labor precautions discussed -Patient advised to follow-up with [facility] this morning as scheduled for prenatal care -Patient may return to MAU as needed or if her condition were to change or worsen   Rolm Bookbinder CNM 06/09/2018, 6:06 AM

## 2018-06-09 NOTE — H&P (Signed)
Attestation signed by Adam Phenix, MD at 06/09/2018 12:40 PM  Attestation of Attending Supervision of Advanced Practitioner (CNM/NP/PA): Evaluation and management procedures were performed by the Advanced Practitioner under my supervision and collaboration. I have reviewed the Advanced Practitioner's note and chart, and I agree with the management and plan. I offered ECV after she asked if it could be attempted and I explained the procedure. The risk of pain, fetal intolerance, labor, bleeding, ROM, emergency cesarean section, and that she may go home after the attempt were discussed and her questions were answered, consented. I checked her cervix and there was no change.  Scheryl Darter MD        Show:Clear all [x] Manual[x] Template[] Copied  Added by: [x] Raelyn Mora, CNM  [] Hover for details  History   CSN: [id number]  Arrival date and time: 06/09/18 2355   First Provider Initiated Contact with Patient 06/09/18 1022         Chief Complaint  Patient presents with  . Vaginal Bleeding   HPI  [name] is a 28 y.o. year old G73P0020 female at [redacted]w[redacted]d weeks gestation who was sent to MAU by Dr. Vergie Living for increased VB. She was d/c'd from MAU earlier and went to her OB appt to have her cerclage removed. Dr. Vergie Living noted a small amount of old blood in her vaginal vault during the cerclage removal. He then advised the patient to come to MAU for further evaluation and possibly c-section today d/t breech presentation. She reports UC's continuing every 3-4 mins and painful; rates pain 9/10.       Past Medical History:  Diagnosis Date  . Hip fracture (HCC) 2015  . Ovarian cyst          Past Surgical History:  Procedure Laterality Date  . CERVICAL CERCLAGE N/A 01/20/2018   Procedure: CERCLAGE CERVICAL;  Surgeon: Hermina Staggers, MD;  Location: [facility] BIRTHING SUITES;  Service: Gynecology;  Laterality: N/A;  . HIP FRACTURE SURGERY Bilateral 2015          Family History  Problem Relation Age of Onset  . Diabetes Mother   . Hypertension Mother   . Diabetes Maternal Grandmother   . Asthma Neg Hx   . Cancer Neg Hx   . Heart disease Neg Hx   . Stroke Neg Hx     Social History        Tobacco Use  . Smoking status: Former Smoker    Types: Cigarettes  . Smokeless tobacco: Never Used  . Tobacco comment: in high school  Substance Use Topics  . Alcohol use: Not Currently    Comment: 2018  . Drug use: Never    Allergies: No Known Allergies         Medications Prior to Admission  Medication Sig Dispense Refill Last Dose  . Prenatal Vit-Fe Fumarate-FA (PRENATAL MULTIVITAMIN) TABS tablet Take 1 tablet by mouth daily at 12 noon.   Taking    Review of Systems  Constitutional: Negative.   HENT: Negative.   Eyes: Negative.   Respiratory: Negative.   Cardiovascular: Negative.   Gastrointestinal: Negative.   Endocrine: Negative.   Genitourinary: Positive for pelvic pain (UC's) and vaginal bleeding.  Musculoskeletal: Negative.   Skin: Negative.   Allergic/Immunologic: Negative.   Neurological: Negative.   Hematological: Negative.   Psychiatric/Behavioral: Negative.    Physical Exam   Blood pressure 125/88, pulse 72, temperature 98 F (36.7 C), resp. rate 18, last menstrual period 09/22/2017, SpO2 100 %.  Physical  Exam  Nursing note and vitals reviewed. Constitutional: She is oriented to person, place, and time. She appears well-developed and well-nourished.  HENT:  Head: Normocephalic and atraumatic.  Eyes: Pupils are equal, round, and reactive to light.  Neck: Normal range of motion.  Cardiovascular: Normal rate.  Respiratory: Effort normal.  GI: Soft.  Genitourinary:    Genitourinary Comments: Speculum exam: small to moderate amount of old blood in vaginal vault removed with 2 large cotton swabs, no active bleeding noted at the time of exam   Musculoskeletal: Normal range of motion.   Neurological: She is alert and oriented to person, place, and time. She has normal reflexes.  Skin: Skin is warm and dry.  Psychiatric: She has a normal mood and affect. Her behavior is normal. Judgment and thought content normal.    MAU Course  Procedures  MDM NST - FHR: 135 bpm / moderate variability / accels present / decels absent / TOCO: regular every 5-6 mins   *Consult with Dr. Earlene Plater @ 1031 - notified of assessments & NST results -- prep for OR and notify Dr. Debroah Loop  *Consult with Dr. Debroah Loop @ 1032 - notified of Dr. Earlene Plater' recommendation to perform c-section on patient -- Dr. Debroah Loop in OR at the time of call; all information conveyed by an OR RN.  Assessment and Plan  Breech presentation, single or unspecified fetus - Admit to L&D for ECV per Dr. Debroah Loop - Dr. Debroah Loop to assume care of patient  Raelyn Mora, MSN, CNM 06/09/2018, 10:35 AM         Cosigned by: Adam Phenix, MD at 06/09/2018 12:40 PM  Electronically signed by Raelyn Mora, CNM at 06/09/2018 11:30 AM Electronically signed by Adam Phenix, MD at 06/09/2018 12:40 PM     Admission (Current) on 06/09/2018

## 2018-06-09 NOTE — Discharge Instructions (Signed)

## 2018-06-09 NOTE — MAU Note (Signed)
Pt here with c/o contractions and possible rupture of membranes. Having some bleeding and leaking of fluid. Reports fetal movement. Was checked yesterday and was closed. Has cerclage placed. Baby was breech yesterday.

## 2018-06-09 NOTE — MAU Note (Signed)
Pt sent over from clinic for evaluation, Dr Vergie Living removed cerclage this morning and pt is having vaginal bleeding with contractions

## 2018-06-09 NOTE — Progress Notes (Signed)
Prenatal Visit Note Date: 06/09/2018 Clinic: [facility]  Subjective:  Alayna Merlos Chrystine Oiler is a 28 y.o. G3P0020 at [redacted]w[redacted]d being seen today for ongoing prenatal care.  She is currently monitored for the following issues for this high-risk pregnancy and has Bicornate uterus complicating pregnancy; Supervision of high risk pregnancy, antepartum; Incompetent cervix; IUGR (intrauterine growth restriction) affecting care of mother; Cervical cerclage suture present; and Malpresentation before onset of labor on their problem list.  Patient reports: see MAU note. Patient states UCs feel about q3-40m. NPO since 2330 yesterday. +FM. VB persisting Contractions: Regular. Vag. Bleeding: Large.  Movement: Present. Denies leaking of fluid.   The following portions of the patient's history were reviewed and updated as appropriate: allergies, current medications, past family history, past medical history, past social history, past surgical history and problem list. Problem list updated.  Objective:   Vitals:   06/09/18 0831  BP: 113/89  Pulse: 80  Weight: 175 lb (79.4 kg)    Fetal Status: Fetal Heart Rate (bpm): 140s   Movement: Present     General:  Alert, oriented and cooperative. Patient is in no acute distress.  Skin: Skin is warm and dry. No rash noted.   Cardiovascular: Normal heart rate noted  Respiratory: Normal respiratory effort, no problems with respiration noted  Abdomen: Soft, gravid, appropriate for gestational age. Pain/Pressure: Present     Pelvic:  Cervical exam performed  Small amount of dark blood at the introitus. On spec exam, about 73mL of old blood in the vault, cervix visualized and no active bleeding from the os or the cerclage site. Cerlcage seen and held with the ringed forceps. Blue ethibond suture cut and removed and intact, approx 4-5cm and mersilene tape also cut and same length removed. Cervix checked and exam below  Dilation: Closed Effacement (%):  Thick Station: Ballotable  Extremities: Normal range of motion.  Edema: Trace  Mental Status: Normal mood and affect. Normal behavior. Normal judgment and thought content.   Urinalysis:      Assessment and Plan:  Pregnancy: G3P0020 at [redacted]w[redacted]d  1. Supervision of high risk pregnancy, antepartum Done today - Strep Gp B NAA - Cervicovaginal ancillary only  2. Vaginal bleeding in pregnancy Recommend staying npo and going to mau for eval. Dr. Earlene Plater aware and may need to be delivered today. O POS  3. 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 Cervical cerclage suture present in third trimester Removed in office today  5. Intrauterine growth restriction (IUGR) affecting care of mother, third trimester, single or unspecified fetus Followed by mfm who recommend 39wk delivery as of her eval last week.   6. Malpresentation before onset of labor, single or unspecified fetus  Term labor symptoms and general obstetric precautions including but not limited to vaginal bleeding, contractions, leaking of fluid and fetal movement were reviewed in detail with the patient. Please refer to After Visit Summary for other counseling recommendations.  Return in about 1 week (around 06/16/2018) for hrob.   [place] Bing, MD

## 2018-06-09 NOTE — Discharge Instructions (Signed)
Cesarean Delivery Cesarean birth, or cesarean delivery, is the surgical delivery of a baby through an incision in the abdomen and the uterus. This may be referred to as a C-section. This procedure may be scheduled ahead of time, or it may be done in an emergency situation. Tell a health care provider about:  Any allergies you have.  All medicines you are taking, including vitamins, herbs, eye drops, creams, and over-the-counter medicines.  Any problems you or family members have had with anesthetic medicines.  Any blood disorders you have.  Any surgeries you have had.  Any medical conditions you have.  Whether you or any members of your family have a history of deep vein thrombosis (DVT) or pulmonary embolism (PE). What are the risks? Generally, this is a safe procedure. However, problems may occur, including:  Infection.  Bleeding.  Allergic reactions to medicines.  Damage to other structures or organs.  Blood clots.  Injury to your baby. What happens before the procedure? General instructions  Follow instructions from your health care provider about eating or drinking restrictions.  If you know that you are going to have a cesarean delivery, do not shave your pubic area. Shaving before the procedure may increase your risk of infection.  Plan to have someone take you home from the hospital.  Ask your health care provider what steps will be taken to prevent infection. These may include: ? Removing hair at the surgery site. ? Washing skin with a germ-killing soap. ? Taking antibiotic medicine.  Depending on the reason for your cesarean delivery, you may have a physical exam or additional testing, such as an ultrasound.  You may have your blood or urine tested. Questions for your health care provider  Ask your health care provider about: ? Changing or stopping your regular medicines. This is especially important if you are taking diabetes medicines or blood  thinners. ? Your pain management plan. This is especially important if you plan to breastfeed your baby. ? How long you will be in the hospital after the procedure. ? Any concerns you may have about receiving blood products, if you need them during the procedure. ? Cord blood banking, if you plan to collect your baby's umbilical cord blood.  You may also want to ask your health care provider: ? Whether you will be able to hold or breastfeed your baby while you are still in the operating room. ? Whether your baby can stay with you immediately after the procedure and during your recovery. ? Whether a family member or a person of your choice can go with you into the operating room and stay with you during the procedure, immediately after the procedure, and during your recovery. What happens during the procedure?   An IV will be inserted into one of your veins.  Fluid and medicines, such as antibiotics, will be given before the surgery.  Fetal monitors will be placed on your abdomen to check your baby's heart rate.  You may be given a special warming gown to wear to keep your temperature stable.  A catheter may be inserted into your bladder through your urethra. This drains your urine during the procedure.  You may be given one or more of the following: ? A medicine to numb the area (local anesthetic). ? A medicine to make you fall asleep (general anesthetic). ? A medicine (regional anesthetic) that is injected into your back or through a small thin tube placed in your back (spinal anesthetic or epidural  anesthetic). This numbs everything below the injection site and allows you to stay awake during your procedure. If this makes you feel nauseous, tell your health care provider. Medicines will be available to help reduce any nausea you may feel.  An incision will be made in your abdomen, and then in your uterus.  If you are awake during your procedure, you may feel tugging and pulling in  your abdomen, but you should not feel pain. If you feel pain, tell your health care provider immediately.  Your baby will be removed from your uterus. You may feel more pressure or pushing while this happens.  Immediately after birth, your baby will be dried and kept warm. You may be able to hold and breastfeed your baby.  The umbilical cord may be clamped and cut during this time. This usually occurs after waiting a period of 1-2 minutes after delivery.  Your placenta will be removed from your uterus.  Your incisions will be closed with stitches (sutures). Staples, skin glue, or adhesive strips may also be applied to the incision in your abdomen.  Bandages (dressings) may be placed over the incision in your abdomen. The procedure may vary among health care providers and hospitals. What happens after the procedure?  Your blood pressure, heart rate, breathing rate, and blood oxygen level will be monitored until you are discharged from the hospital.  You may continue to receive fluids and medicines through an IV.  You will have some pain. Medicines will be available to help control your pain.  To help prevent blood clots: ? You may be given medicines. ? You may have to wear compression stockings or devices. ? You will be encouraged to walk around when you are able.  Hospital staff will encourage and support bonding with your baby. Your hospital may have you and your baby to stay in the same room (rooming in) during your hospital stay to encourage successful bonding and breastfeeding.  You may be encouraged to cough and breathe deeply often. This helps to prevent lung problems.  If you have a catheter draining your urine, it will be removed as soon as possible after your procedure. Summary  Cesarean birth, or cesarean delivery, is the surgical delivery of a baby through an incision in the abdomen and the uterus.  Follow instructions from your health care provider about eating or  drinking restrictions before the procedure.  You will have some pain after the procedure. Medicines will be available to help control your pain.  Hospital staff will encourage and support bonding with your baby after the procedure. Your hospital may have you and your baby to stay in the same room (rooming in) during your hospital stay to encourage successful bonding and breastfeeding. This information is not intended to replace advice given to you by your health care provider. Make sure you discuss any questions you have with your health care provider. Document Released: 03/12/2005 Document Revised: 09/16/2017 Document Reviewed: 09/16/2017 Elsevier Interactive Patient Education  2019 [employer]. Cesarean Delivery Cesarean birth, or cesarean delivery, is the surgical delivery of a baby through an incision in the abdomen and the uterus. This may be referred to as a C-section. This procedure may be scheduled ahead of time, or it may be done in an emergency situation. Tell a health care provider about:  Any allergies you have.  All medicines you are taking, including vitamins, herbs, eye drops, creams, and over-the-counter medicines.  Any problems you or family members have had with anesthetic  medicines.  Any blood disorders you have.  Any surgeries you have had.  Any medical conditions you have.  Whether you or any members of your family have a history of deep vein thrombosis (DVT) or pulmonary embolism (PE). What are the risks? Generally, this is a safe procedure. However, problems may occur, including:  Infection.  Bleeding.  Allergic reactions to medicines.  Damage to other structures or organs.  Blood clots.  Injury to your baby. What happens before the procedure? General instructions  Follow instructions from your health care provider about eating or drinking restrictions.  If you know that you are going to have a cesarean delivery, do not shave your pubic area.  Shaving before the procedure may increase your risk of infection.  Plan to have someone take you home from the hospital.  Ask your health care provider what steps will be taken to prevent infection. These may include: ? Removing hair at the surgery site. ? Washing skin with a germ-killing soap. ? Taking antibiotic medicine.  Depending on the reason for your cesarean delivery, you may have a physical exam or additional testing, such as an ultrasound.  You may have your blood or urine tested. Questions for your health care provider  Ask your health care provider about: ? Changing or stopping your regular medicines. This is especially important if you are taking diabetes medicines or blood thinners. ? Your pain management plan. This is especially important if you plan to breastfeed your baby. ? How long you will be in the hospital after the procedure. ? Any concerns you may have about receiving blood products, if you need them during the procedure. ? Cord blood banking, if you plan to collect your baby's umbilical cord blood.  You may also want to ask your health care provider: ? Whether you will be able to hold or breastfeed your baby while you are still in the operating room. ? Whether your baby can stay with you immediately after the procedure and during your recovery. ? Whether a family member or a person of your choice can go with you into the operating room and stay with you during the procedure, immediately after the procedure, and during your recovery. What happens during the procedure?   An IV will be inserted into one of your veins.  Fluid and medicines, such as antibiotics, will be given before the surgery.  Fetal monitors will be placed on your abdomen to check your baby's heart rate.  You may be given a special warming gown to wear to keep your temperature stable.  A catheter may be inserted into your bladder through your urethra. This drains your urine during the  procedure.  You may be given one or more of the following: ? A medicine to numb the area (local anesthetic). ? A medicine to make you fall asleep (general anesthetic). ? A medicine (regional anesthetic) that is injected into your back or through a small thin tube placed in your back (spinal anesthetic or epidural anesthetic). This numbs everything below the injection site and allows you to stay awake during your procedure. If this makes you feel nauseous, tell your health care provider. Medicines will be available to help reduce any nausea you may feel.  An incision will be made in your abdomen, and then in your uterus.  If you are awake during your procedure, you may feel tugging and pulling in your abdomen, but you should not feel pain. If you feel pain, tell your health care  provider immediately.  Your baby will be removed from your uterus. You may feel more pressure or pushing while this happens.  Immediately after birth, your baby will be dried and kept warm. You may be able to hold and breastfeed your baby.  The umbilical cord may be clamped and cut during this time. This usually occurs after waiting a period of 1-2 minutes after delivery.  Your placenta will be removed from your uterus.  Your incisions will be closed with stitches (sutures). Staples, skin glue, or adhesive strips may also be applied to the incision in your abdomen.  Bandages (dressings) may be placed over the incision in your abdomen. The procedure may vary among health care providers and hospitals. What happens after the procedure?  Your blood pressure, heart rate, breathing rate, and blood oxygen level will be monitored until you are discharged from the hospital.  You may continue to receive fluids and medicines through an IV.  You will have some pain. Medicines will be available to help control your pain.  To help prevent blood clots: ? You may be given medicines. ? You may have to wear compression  stockings or devices. ? You will be encouraged to walk around when you are able.  Hospital staff will encourage and support bonding with your baby. Your hospital may have you and your baby to stay in the same room (rooming in) during your hospital stay to encourage successful bonding and breastfeeding.  You may be encouraged to cough and breathe deeply often. This helps to prevent lung problems.  If you have a catheter draining your urine, it will be removed as soon as possible after your procedure. Summary  Cesarean birth, or cesarean delivery, is the surgical delivery of a baby through an incision in the abdomen and the uterus.  Follow instructions from your health care provider about eating or drinking restrictions before the procedure.  You will have some pain after the procedure. Medicines will be available to help control your pain.  Hospital staff will encourage and support bonding with your baby after the procedure. Your hospital may have you and your baby to stay in the same room (rooming in) during your hospital stay to encourage successful bonding and breastfeeding. This information is not intended to replace advice given to you by your health care provider. Make sure you discuss any questions you have with your health care provider. Document Released: 03/12/2005 Document Revised: 09/16/2017 Document Reviewed: 09/16/2017 Elsevier Interactive Patient Education  2019 [employer].  Preterm Labor and Birth Information  The normal length of a pregnancy is 39-41 weeks. Preterm labor is when labor starts before 37 completed weeks of pregnancy. What are the risk factors for preterm labor? Preterm labor is more likely to occur in women who:  Have certain infections during pregnancy such as a bladder infection, sexually transmitted infection, or infection inside the uterus (chorioamnionitis).  Have a shorter-than-normal cervix.  Have gone into preterm labor before.  Have had  surgery on their cervix.  Are younger than age 65 or older than age 76.  Are African American.  Are pregnant with twins or multiple babies (multiple gestation).  Take street drugs or smoke while pregnant.  Do not gain enough weight while pregnant.  Became pregnant shortly after having been pregnant. What are the symptoms of preterm labor? Symptoms of preterm labor include:  Cramps similar to those that can happen during a menstrual period. The cramps may happen with diarrhea.  Pain in the abdomen or  lower back.  Regular uterine contractions that may feel like tightening of the abdomen.  A feeling of increased pressure in the pelvis.  Increased watery or bloody mucus discharge from the vagina.  Water breaking (ruptured amniotic sac). Why is it important to recognize signs of preterm labor? It is important to recognize signs of preterm labor because babies who are born prematurely may not be fully developed. This can put them at an increased risk for:  Long-term (chronic) heart and lung problems.  Difficulty immediately after birth with regulating body systems, including blood sugar, body temperature, heart rate, and breathing rate.  Bleeding in the brain.  Cerebral palsy.  Learning difficulties.  Death. These risks are highest for babies who are born before 34 weeks of pregnancy. How is preterm labor treated? Treatment depends on the length of your pregnancy, your condition, and the health of your baby. It may involve:  Having a stitch (suture) placed in your cervix to prevent your cervix from opening too early (cerclage).  Taking or being given medicines, such as: ? Hormone medicines. These may be given early in pregnancy to help support the pregnancy. ? Medicine to stop contractions. ? Medicines to help mature the babys lungs. These may be prescribed if the risk of delivery is high. ? Medicines to prevent your baby from developing cerebral palsy. If the labor  happens before 34 weeks of pregnancy, you may need to stay in the hospital. What should I do if I think I am in preterm labor? If you think that you are going into preterm labor, call your health care provider right away. How can I prevent preterm labor in future pregnancies? To increase your chance of having a full-term pregnancy:  Do not use any tobacco products, such as cigarettes, chewing tobacco, and e-cigarettes. If you need help quitting, ask your health care provider.  Do not use street drugs or medicines that have not been prescribed to you during your pregnancy.  Talk with your health care provider before taking any herbal supplements, even if you have been taking them regularly.  Make sure you gain a healthy amount of weight during your pregnancy.  Watch for infection. If you think that you might have an infection, get it checked right away.  Make sure to tell your health care provider if you have gone into preterm labor before. This information is not intended to replace advice given to you by your health care provider. Make sure you discuss any questions you have with your health care provider. Document Released: 06/02/2003 Document Revised: 08/23/2015 Document Reviewed: 08/03/2015 Elsevier Interactive Patient Education  2019 [employer]

## 2018-06-09 NOTE — [facility] Provider Note (Signed)
History     CSN: [id number]  Arrival date and time: 06/09/18 6378   First Provider Initiated Contact with Patient 06/09/18 1022      Chief Complaint  Patient presents with  . Vaginal Bleeding   HPI  [name] Chrystine Oiler is a 28 y.o. year old [id number] female at [redacted]w[redacted]d weeks gestation who was sent to MAU by Dr. Vergie Living for increased VB. She was d/c'd from [facility]U earlier and went to her OB appt to have her cerclage removed. Dr. Vergie Living noted a small amount of old blood in her vaginal vault during the cerclage removal. He then advised the patient to come to [facility]U for further evaluation and possibly c-section today d/t breech presentation. She reports UC's continuing every 3-4 mins and painful; rates pain 9/10.   Past Medical History:  Diagnosis Date  . Hip fracture (HCC) 2015  . Ovarian cyst     Past Surgical History:  Procedure Laterality Date  . CERVICAL CERCLAGE N/A 01/20/2018   Procedure: CERCLAGE CERVICAL;  Surgeon: Hermina Staggers, MD;  Location: [facility];  Service: Gynecology;  Laterality: N/A;  . HIP FRACTURE SURGERY Bilateral 2015    Family History  Problem Relation Age of Onset  . Diabetes Mother   . Hypertension Mother   . Diabetes Maternal Grandmother   . Asthma Neg Hx   . Cancer Neg Hx   . Heart disease Neg Hx   . Stroke Neg Hx     Social History   Tobacco Use  . Smoking status: Former Smoker    Types: Cigarettes  . Smokeless tobacco: Never Used  . Tobacco comment: in high school  Substance Use Topics  . Alcohol use: Not Currently    Comment: 2018  . Drug use: Never    Allergies: No Known Allergies  Medications Prior to Admission  Medication Sig Dispense Refill Last Dose  . Prenatal Vit-Fe Fumarate-FA (PRENATAL MULTIVITAMIN) TABS tablet Take 1 tablet by mouth daily at 12 noon.   Taking    Review of Systems  Constitutional: Negative.   HENT: Negative.   Eyes: Negative.   Respiratory: Negative.   Cardiovascular: Negative.    Gastrointestinal: Negative.   Endocrine: Negative.   Genitourinary: Positive for pelvic pain (UC's) and vaginal bleeding.  Musculoskeletal: Negative.   Skin: Negative.   Allergic/Immunologic: Negative.   Neurological: Negative.   Hematological: Negative.   Psychiatric/Behavioral: Negative.    Physical Exam   Blood pressure 125/88, pulse 72, temperature 98 F (36.7 C), resp. rate 18, last menstrual period 09/22/2017, SpO2 100 %.  Physical Exam  Nursing note and vitals reviewed. Constitutional: She is oriented to person, place, and time. She appears well-developed and well-nourished.  HENT:  Head: Normocephalic and atraumatic.  Eyes: Pupils are equal, round, and reactive to light.  Neck: Normal range of motion.  Cardiovascular: Normal rate.  Respiratory: Effort normal.  GI: Soft.  Genitourinary:    Genitourinary Comments: Speculum exam: small to moderate amount of old blood in vaginal vault removed with 2 large cotton swabs, no active bleeding noted at the time of exam   Musculoskeletal: Normal range of motion.  Neurological: She is alert and oriented to person, place, and time. She has normal reflexes.  Skin: Skin is warm and dry.  Psychiatric: She has a normal mood and affect. Her behavior is normal. Judgment and thought content normal.    MAU Course  Procedures  MDM NST - FHR: 135 bpm / moderate variability / accels present /  decels absent / TOCO: regular every 5-6 mins   *Consult with Dr. Earlene Plater @ 1031 - notified of assessments & NST results -- prep for OR and notify Dr. Debroah Loop  *Consult with Dr. Debroah Loop @ 1032 - notified of Dr. Earlene Plater' recommendation to perform c-section on patient -- Dr. Debroah Loop in OR at the time of call; all information conveyed by an OR RN.  Assessment and Plan  Breech presentation, single or unspecified fetus - Admit to L&D for ECV per Dr. Debroah Loop - Dr. Debroah Loop to assume care of patient  Raelyn Mora, MSN, CNM 06/09/2018, 10:35 AM

## 2018-06-09 NOTE — Procedures (Signed)
External Cephalic Version  Preprocedure Diagnosis:  28 y.o. G3P0 at [redacted] weeks gestational age with breech presentation  Post-procedure Diagnosis:same Procedure:  Attempted version unsuccessful  Procedure in detail:   The patient was brought to Labor and Delivery where a reactive fetal heart tracing was obtained. The patient was noted to have irregular contractions. She was given 1 dose of subcutaneous terbutaline which resolved her contraction. A bedside ultrasound was performed which revealed single intrauterine pregnancy and breech presentation. There was noted to be adequate fluid. Using manual pressure, the breech was manipulated in a forward roll fashion but a vertex presentation was not obtained. Fetal heart tones were checked intermittently during the procedure and were noted to be reassuring. Following unsuccessful external cephalic version, the patient was placed on continuous external fetal monitoring. She was noted to have a reassuring and reactive tracing   Adam Phenix, MD 06/09/2018  1:20 PM

## 2018-06-09 NOTE — Discharge Summary (Signed)
Physician Discharge Summary  Patient ID: Carolyn Joseph MRN: [id number] DOB/AGE: 1990-04-28 28 y.o.  Admit date: 06/09/2018 Discharge date: 06/09/2018  Admission Diagnoses:breech presentation, possible labor  Discharge Diagnoses:  Active Problems:   Malpresentation before onset of labor   Discharged Condition: good  Hospital Course:  Attestation signed by Carolyn Phenix, MD at 06/09/2018 12:40 PM  Attestation of Attending Supervision of Advanced Practitioner (CNM/NP/PA): Evaluation and management procedures were performed by the Advanced Practitioner under my supervision and collaboration. I have reviewed the Advanced Practitioner's note and chart, and I agree with the management and plan. I offered ECV after she asked if it could be attempted and I explained the procedure. The risk of pain, fetal intolerance, labor, bleeding, ROM, emergency cesarean section, and that she may go home after the attempt were discussed and her questions were answered, consented. I checked her cervix and there was no change.  Carolyn Darter MD        Show:Clear all Manual[x] Template[] Copied  Added by: Carolyn Joseph, CNM  Hover for details  History   CSN: [id number]  Arrival date and time: 06/09/18 0906   First Provider Initiated Contact with Patient 06/09/18 1022         Chief Complaint  Patient presents with   Vaginal Bleeding   HPI  [name] is a 28 y.o. year old G49P0020 female at [redacted]w[redacted]d weeks gestation who was sent to MAU by Dr. Vergie Joseph for increased VB. She was d/c'd from MAU earlier and went to her OB appt to have her cerclage removed. Dr. Vergie Joseph noted a small amount of old blood in her vaginal vault during the cerclage removal. He then advised the patient to come to MAU for further evaluation and possibly c-section today d/t breech presentation. She reports UC's continuing every 3-4 mins and painful; rates pain 9/10.       Past  Medical History:  Diagnosis Date   Hip fracture (HCC) 2015   Ovarian cyst          Past Surgical History:  Procedure Laterality Date   CERVICAL CERCLAGE N/A 01/20/2018   Procedure: CERCLAGE CERVICAL;  Surgeon: Carolyn Staggers, MD;  Location: [facility] BIRTHING SUITES;  Service: Gynecology;  Laterality: N/A;   HIP FRACTURE SURGERY Bilateral 2015         Family History  Problem Relation Age of Onset   Diabetes Mother    Hypertension Mother    Diabetes Maternal Grandmother    Asthma Neg Hx    Cancer Neg Hx    Heart disease Neg Hx    Stroke Neg Hx     Social History        Tobacco Use   Smoking status: Former Smoker    Types: Cigarettes   Smokeless tobacco: Never Used   Tobacco comment: in high school  Substance Use Topics   Alcohol use: Not Currently    Comment: 2018   Drug use: Never    Allergies: No Known Allergies         Medications Prior to Admission  Medication Sig Dispense Refill Last Dose   Prenatal Vit-Fe Fumarate-FA (PRENATAL MULTIVITAMIN) TABS tablet Take 1 tablet by mouth daily at 12 noon.   Taking    Review of Systems  Constitutional: Negative.   HENT: Negative.   Eyes: Negative.   Respiratory: Negative.   Cardiovascular: Negative.   Gastrointestinal: Negative.   Endocrine: Negative.   Genitourinary: Positive for pelvic pain (UC's) and vaginal bleeding.  Musculoskeletal:  Negative.   Skin: Negative.   Allergic/Immunologic: Negative.   Neurological: Negative.   Hematological: Negative.   Psychiatric/Behavioral: Negative.    Physical Exam   Blood pressure 125/88, pulse 72, temperature 98 F (36.7 C), resp. rate 18, last menstrual period 09/22/2017, SpO2 100 %.  Physical Exam  Nursing note and vitals reviewed. Constitutional: She is oriented to person, place, and time. She appears well-developed and well-nourished.  HENT:  Head: Normocephalic and atraumatic.  Eyes: Pupils are equal, round, and  reactive to light.  Neck: Normal range of motion.  Cardiovascular: Normal rate.  Respiratory: Effort normal.  GI: Soft.  Genitourinary:    Genitourinary Comments: Speculum exam: small to moderate amount of old blood in vaginal vault removed with 2 large cotton swabs, no active bleeding noted at the time of exam   Musculoskeletal: Normal range of motion.  Neurological: She is alert and oriented to person, place, and time. She has normal reflexes.  Skin: Skin is warm and dry.  Psychiatric: She has a normal mood and affect. Her behavior is normal. Judgment and thought content normal.    MAU Course  Procedures  MDM NST - FHR: 135 bpm / moderate variability / accels present / decels absent / TOCO: regular every 5-6 mins   *Consult with Carolyn Joseph @ 1031 - notified of assessments & NST results -- prep for OR and notify Carolyn Joseph  *Consult with Carolyn Joseph @ 1032 - notified of Carolyn Joseph' recommendation to perform c-section on patient -- Carolyn Joseph in OR at the time of call; all information conveyed by an OR RN.  Assessment and Plan  Breech presentation, single or unspecified fetus - Admit to L&D for ECV per Carolyn Joseph - Carolyn Joseph to assume care of patient      Procedure in detail:   The patient was brought to Labor and Delivery where a reactive fetal heart tracing was obtained. The patient was noted to have irregular contractions. She was given 1 dose of subcutaneous terbutaline which resolved her contraction. A bedside ultrasound was performed which revealed single intrauterine pregnancy and breech presentation. There was noted to be adequate fluid. Using manual pressure, the breech was manipulated in a forward roll fashion but a vertex presentation was not obtained. Fetal heart tones were checked intermittently during the procedure and were noted to be reassuring. Following unsuccessful external cephalic version, the patient was placed on continuous external fetal monitoring. She was  noted to have a reassuring and reactive tracing   Consults: None  Significant Diagnostic Studies: bedside US, attempted ECV  Treatments: IV hydration  Discharge Exam: Blood pressure (!) 114/97, pulse 82, temperature 98.2 F (36.8 C), temperature source Oral, resp. rate 18, height 5\' 5"  (1.651 m), weight 79.4 kg, last menstrual period 09/22/2017, SpO2 100 %. General appearance: alert, cooperative and no distress Pelvic: external genitalia normal, no cervical change noted during her stay and contractions became less frequent. She was discharged as she was not in labor to be scheduled for cesarean section for breech at 39 weeks  Disposition: Discharge disposition: 01-Home or Self Care       Discharge Instructions    Discharge patient   Complete by:  As directed    Discharge disposition:  01-Home or Self Care   Discharge patient date:  06/09/2018     Allergies as of 06/09/2018   No Known Allergies     Medication List    TAKE these medications   prenatal multivitamin Tabs tablet Take 1  tablet by mouth daily at 12 noon.   zolpidem 5 MG tablet Commonly known as:  Ambien Take 1 tablet (5 mg total) by mouth at bedtime as needed for sleep.      Follow-up Information    [facility] Follow up in 1 week(s).   Specialty:  Obstetrics and Gynecology Contact information: [address] [place] [address] [phone]          Signed: Scheryl Joseph 06/09/2018, 3:34 PM

## 2018-06-09 NOTE — Progress Notes (Signed)
Pt stated started having contractions yesterday 20 min apart, today they are 3 to 4 min apart & also started bleeding 2 am this morning.

## 2018-06-10 ENCOUNTER — Encounter (HOSPITAL_COMMUNITY): Payer: Self-pay | Admitting: *Deleted

## 2018-06-10 ENCOUNTER — Inpatient Hospital Stay (HOSPITAL_COMMUNITY): Payer: Medicaid Other | Admitting: Anesthesiology

## 2018-06-10 ENCOUNTER — Inpatient Hospital Stay (HOSPITAL_COMMUNITY)
Admission: AD | Admit: 2018-06-10 | Discharge: 2018-06-12 | DRG: 788 | Disposition: A | Discharge status: Home / Self Care | Payer: Medicaid Other | Source: Ambulatory Visit | Attending: Obstetrics & Gynecology | Admitting: Obstetrics & Gynecology

## 2018-06-10 ENCOUNTER — Encounter (HOSPITAL_COMMUNITY)
Admission: AD | Disposition: A | Discharge status: Home / Self Care | Payer: Self-pay | Source: Ambulatory Visit | Attending: Obstetrics & Gynecology

## 2018-06-10 DIAGNOSIS — Z9189 Other specified personal risk factors, not elsewhere classified: Secondary | ICD-10-CM

## 2018-06-10 DIAGNOSIS — Q513 Bicornate uterus: Secondary | ICD-10-CM | POA: Diagnosis not present

## 2018-06-10 DIAGNOSIS — Z87891 Personal history of nicotine dependence: Secondary | ICD-10-CM

## 2018-06-10 DIAGNOSIS — Z3A37 37 weeks gestation of pregnancy: Secondary | ICD-10-CM

## 2018-06-10 DIAGNOSIS — O3403 Maternal care for unspecified congenital malformation of uterus, third trimester: Secondary | ICD-10-CM | POA: Diagnosis present

## 2018-06-10 DIAGNOSIS — O0990 Supervision of high risk pregnancy, unspecified, unspecified trimester: Secondary | ICD-10-CM

## 2018-06-10 DIAGNOSIS — O99820 Streptococcus B carrier state complicating pregnancy: Secondary | ICD-10-CM

## 2018-06-10 DIAGNOSIS — O365990 Maternal care for other known or suspected poor fetal growth, unspecified trimester, not applicable or unspecified: Secondary | ICD-10-CM | POA: Diagnosis present

## 2018-06-10 DIAGNOSIS — O365930 Maternal care for other known or suspected poor fetal growth, third trimester, not applicable or unspecified: Secondary | ICD-10-CM | POA: Diagnosis present

## 2018-06-10 DIAGNOSIS — O99824 Streptococcus B carrier state complicating childbirth: Secondary | ICD-10-CM | POA: Diagnosis present

## 2018-06-10 DIAGNOSIS — O321XX0 Maternal care for breech presentation, not applicable or unspecified: Principal | ICD-10-CM | POA: Diagnosis present

## 2018-06-10 DIAGNOSIS — O329XX0 Maternal care for malpresentation of fetus, unspecified, not applicable or unspecified: Secondary | ICD-10-CM | POA: Diagnosis present

## 2018-06-10 DIAGNOSIS — O3400 Maternal care for unspecified congenital malformation of uterus, unspecified trimester: Secondary | ICD-10-CM

## 2018-06-10 LAB — CBC
HCT: 41.4 % (ref 36.0–46.0)
HEMATOCRIT: 35.4 % — AB (ref 36.0–46.0)
Hemoglobin: 13.1 g/dL (ref 12.0–15.0)
Hemoglobin: 12 g/dL (ref 12.0–15.0)
MCH: 27.9 pg (ref 26.0–34.0)
MCH: 29.9 pg (ref 26.0–34.0)
MCHC: 31.6 g/dL (ref 30.0–36.0)
MCHC: 33.9 g/dL (ref 30.0–36.0)
MCV: 88.3 fL (ref 80.0–100.0)
MCV: 88.1 fL (ref 80.0–100.0)
Platelets: 191 10*3/uL (ref 150–400)
Platelets: 164 10*3/uL (ref 150–400)
RBC: 4.69 MIL/uL (ref 3.87–5.11)
RBC: 4.02 MIL/uL (ref 3.87–5.11)
RDW: 14.7 % (ref 11.5–15.5)
RDW: 14.8 % (ref 11.5–15.5)
WBC: 14.4 10*3/uL — ABNORMAL HIGH (ref 4.0–10.5)
WBC: 20.6 10*3/uL — ABNORMAL HIGH (ref 4.0–10.5)
nRBC: 0 % (ref 0.0–0.2)
nRBC: 0 % (ref 0.0–0.2)

## 2018-06-10 LAB — TYPE AND SCREEN
ABO/RH(D): O POS
ABO/RH(D): O POS
Antibody Screen: NEGATIVE
Antibody Screen: NEGATIVE

## 2018-06-10 LAB — CERVICOVAGINAL ANCILLARY ONLY
Chlamydia: NEGATIVE
Neisseria Gonorrhea: NEGATIVE
Trichomonas: NEGATIVE

## 2018-06-10 LAB — CREATININE, SERUM
Creatinine, Ser: 0.48 mg/dL (ref 0.44–1.00)
GFR calc Af Amer: >60 mL/min (ref >60)
GFR calc non Af Amer: >60 mL/min (ref >60)

## 2018-06-10 LAB — ABO/RH: ABO/RH(D): O POS

## 2018-06-10 LAB — RAPID HIV SCREEN (HIV 1/2 AB+AG)
HIV 1/2 Antibodies: NONREACTIVE
HIV-1 P24 Antigen - HIV24: NONREACTIVE

## 2018-06-10 LAB — RPR
RPR Ser Ql: NONREACTIVE
RPR Ser Ql: NONREACTIVE

## 2018-06-10 SURGERY — Surgical Case
Anesthesia: Spinal | Site: Abdomen | Wound class: Clean Contaminated

## 2018-06-10 MED ORDER — DIBUCAINE 1 % RE OINT: 1.0000 "application " | TOPICAL_OINTMENT | RECTAL | Status: DC | PRN

## 2018-06-10 MED ORDER — SCOPOLAMINE 1 MG/3DAYS TD PT72
MEDICATED_PATCH | TRANSDERMAL | Status: AC
  Filled 2018-06-10: qty 1

## 2018-06-10 MED ORDER — MEPERIDINE HCL 25 MG/ML IJ SOLN
INTRAMUSCULAR | Status: AC
  Filled 2018-06-10: qty 1

## 2018-06-10 MED ORDER — SOD CITRATE-CITRIC ACID 500-334 MG/5ML PO SOLN
30.0000 mL | ORAL | Status: AC
  Administered 2018-06-10: 30 mL via ORAL
  Filled 2018-06-10: qty 15

## 2018-06-10 MED ORDER — BUPIVACAINE HCL (PF) 0.5 % IJ SOLN
INTRAMUSCULAR | Status: AC
  Filled 2018-06-10: qty 30

## 2018-06-10 MED ORDER — FENTANYL CITRATE (PF) 100 MCG/2ML IJ SOLN
INTRAMUSCULAR | Status: DC | PRN
  Administered 2018-06-10: 15 ug via INTRATHECAL

## 2018-06-10 MED ORDER — NALBUPHINE HCL 10 MG/ML IJ SOLN: 5.0000 mg | INTRAMUSCULAR | Status: DC | PRN

## 2018-06-10 MED ORDER — KETOROLAC TROMETHAMINE 30 MG/ML IJ SOLN: 30.0000 mg | Freq: Once | INTRAMUSCULAR | Status: DC | PRN

## 2018-06-10 MED ORDER — LACTATED RINGERS IV SOLN
INTRAVENOUS | Status: DC
  Administered 2018-06-10 (×2): via INTRAVENOUS

## 2018-06-10 MED ORDER — MENTHOL 3 MG MT LOZG
1.0000 | LOZENGE | OROMUCOSAL | Status: DC | PRN
  Administered 2018-06-10: 3 mg via ORAL
  Filled 2018-06-10 (×2): qty 9

## 2018-06-10 MED ORDER — FAMOTIDINE 20 MG IN NS 100 ML IVPB: 20.0000 mg | Freq: Once | INTRAVENOUS | Status: DC

## 2018-06-10 MED ORDER — PHENYLEPHRINE HCL-NACL 20-0.9 MG/250ML-% IV SOLN
INTRAVENOUS | Status: DC | PRN
  Administered 2018-06-10: 40 ug/min via INTRAVENOUS
  Administered 2018-06-10: 20 ug/min via INTRAVENOUS

## 2018-06-10 MED ORDER — FAMOTIDINE 20 MG IN NS 100 ML IVPB
20.0000 mg | Freq: Once | INTRAVENOUS | Status: AC
  Administered 2018-06-10: 20 mg via INTRAVENOUS
  Filled 2018-06-10: qty 100

## 2018-06-10 MED ORDER — BUPIVACAINE IN DEXTROSE 0.75-8.25 % IT SOLN
INTRATHECAL | Status: DC | PRN
  Administered 2018-06-10: 1.6 mL via INTRATHECAL

## 2018-06-10 MED ORDER — KETOROLAC TROMETHAMINE 30 MG/ML IJ SOLN
30.0000 mg | Freq: Four times a day (QID) | INTRAMUSCULAR | Status: AC | PRN
  Administered 2018-06-10: 30 mg via INTRAVENOUS
  Filled 2018-06-10 (×3): qty 1

## 2018-06-10 MED ORDER — FENTANYL CITRATE (PF) 100 MCG/2ML IJ SOLN
INTRAMUSCULAR | Status: AC
  Filled 2018-06-10: qty 2

## 2018-06-10 MED ORDER — LACTATED RINGERS IV SOLN
INTRAVENOUS | Status: DC
  Administered 2018-06-10: 13:00:00 via INTRAVENOUS

## 2018-06-10 MED ORDER — OXYTOCIN 40 UNITS IN NORMAL SALINE INFUSION - SIMPLE MED: 2.5000 [IU]/h | INTRAVENOUS | Status: AC

## 2018-06-10 MED ORDER — ACETAMINOPHEN 325 MG PO TABS: 325.0000 mg | ORAL_TABLET | ORAL | Status: DC | PRN

## 2018-06-10 MED ORDER — ONDANSETRON HCL 4 MG/2ML IJ SOLN: 4.0000 mg | Freq: Three times a day (TID) | INTRAMUSCULAR | Status: DC | PRN

## 2018-06-10 MED ORDER — OXYCODONE-ACETAMINOPHEN 5-325 MG PO TABS: 1.0000 | ORAL_TABLET | ORAL | Status: DC | PRN

## 2018-06-10 MED ORDER — DIPHENHYDRAMINE HCL 50 MG/ML IJ SOLN: 12.5000 mg | INTRAMUSCULAR | Status: DC | PRN

## 2018-06-10 MED ORDER — BUPIVACAINE HCL (PF) 0.5 % IJ SOLN
INTRAMUSCULAR | Status: DC | PRN
  Administered 2018-06-10: 30 mL

## 2018-06-10 MED ORDER — NALBUPHINE HCL 10 MG/ML IJ SOLN: 5.0000 mg | Freq: Once | INTRAMUSCULAR | Status: DC | PRN

## 2018-06-10 MED ORDER — ACETAMINOPHEN 160 MG/5ML PO SOLN: 325.0000 mg | ORAL | Status: DC | PRN

## 2018-06-10 MED ORDER — MEPERIDINE HCL 25 MG/ML IJ SOLN
INTRAMUSCULAR | Status: DC | PRN
  Administered 2018-06-10: 12.5 mg via INTRAVENOUS

## 2018-06-10 MED ORDER — COCONUT OIL OIL
1.0000 "application " | TOPICAL_OIL | Status: DC | PRN
  Administered 2018-06-10: 1 via TOPICAL

## 2018-06-10 MED ORDER — PHENYLEPHRINE 40 MCG/ML (10ML) SYRINGE FOR IV PUSH (FOR BLOOD PRESSURE SUPPORT)
PREFILLED_SYRINGE | INTRAVENOUS | Status: AC
  Filled 2018-06-10: qty 10

## 2018-06-10 MED ORDER — MORPHINE SULFATE (PF) 0.5 MG/ML IJ SOLN
INTRAMUSCULAR | Status: AC
  Filled 2018-06-10: qty 10

## 2018-06-10 MED ORDER — ONDANSETRON HCL 4 MG/2ML IJ SOLN: 4.0000 mg | Freq: Once | INTRAMUSCULAR | Status: DC | PRN

## 2018-06-10 MED ORDER — ENOXAPARIN SODIUM 40 MG/0.4ML ~~LOC~~ SOLN
40.0000 mg | SUBCUTANEOUS | Status: DC
  Administered 2018-06-10: 40 mg via SUBCUTANEOUS
  Filled 2018-06-10 (×2): qty 0.4

## 2018-06-10 MED ORDER — DIPHENHYDRAMINE HCL 25 MG PO CAPS: 25.0000 mg | ORAL_CAPSULE | Freq: Four times a day (QID) | ORAL | Status: DC | PRN

## 2018-06-10 MED ORDER — SIMETHICONE 80 MG PO CHEW
80.0000 mg | CHEWABLE_TABLET | ORAL | Status: DC
  Administered 2018-06-10 – 2018-06-11 (×2): 80 mg via ORAL
  Filled 2018-06-10: qty 1

## 2018-06-10 MED ORDER — NALOXONE HCL 4 MG/10ML IJ SOLN
1.0000 ug/kg/h | INTRAVENOUS | Status: DC | PRN
  Filled 2018-06-10: qty 5

## 2018-06-10 MED ORDER — SCOPOLAMINE 1 MG/3DAYS TD PT72
MEDICATED_PATCH | TRANSDERMAL | Status: DC | PRN
  Administered 2018-06-10: 1 via TRANSDERMAL

## 2018-06-10 MED ORDER — KETOROLAC TROMETHAMINE 30 MG/ML IJ SOLN
INTRAMUSCULAR | Status: AC
  Filled 2018-06-10: qty 1

## 2018-06-10 MED ORDER — CEFAZOLIN SODIUM-DEXTROSE 2-3 GM-%(50ML) IV SOLR
INTRAVENOUS | Status: DC | PRN
  Administered 2018-06-10: 2 g via INTRAVENOUS

## 2018-06-10 MED ORDER — TETANUS-DIPHTH-ACELL PERTUSSIS 5-2.5-18.5 LF-MCG/0.5 IM SUSP: 0.5000 mL | Freq: Once | INTRAMUSCULAR | Status: DC

## 2018-06-10 MED ORDER — OXYTOCIN 40 UNITS IN NORMAL SALINE INFUSION - SIMPLE MED
INTRAVENOUS | Status: AC
  Filled 2018-06-10: qty 1000

## 2018-06-10 MED ORDER — MORPHINE SULFATE (PF) 0.5 MG/ML IJ SOLN
INTRAMUSCULAR | Status: DC | PRN
  Administered 2018-06-10: .15 mg via INTRATHECAL

## 2018-06-10 MED ORDER — NALOXONE HCL 0.4 MG/ML IJ SOLN: 0.4000 mg | INTRAMUSCULAR | Status: DC | PRN

## 2018-06-10 MED ORDER — IBUPROFEN 600 MG PO TABS
600.0000 mg | ORAL_TABLET | Freq: Four times a day (QID) | ORAL | Status: DC | PRN
  Administered 2018-06-11 – 2018-06-12 (×4): 600 mg via ORAL
  Filled 2018-06-10 (×5): qty 1

## 2018-06-10 MED ORDER — SENNOSIDES-DOCUSATE SODIUM 8.6-50 MG PO TABS
2.0000 | ORAL_TABLET | ORAL | Status: DC
  Administered 2018-06-10 – 2018-06-11 (×2): 2 via ORAL
  Filled 2018-06-10 (×2): qty 2

## 2018-06-10 MED ORDER — ONDANSETRON HCL 4 MG/2ML IJ SOLN
INTRAMUSCULAR | Status: DC | PRN
  Administered 2018-06-10: 4 mg via INTRAVENOUS

## 2018-06-10 MED ORDER — SIMETHICONE 80 MG PO CHEW
80.0000 mg | CHEWABLE_TABLET | Freq: Three times a day (TID) | ORAL | Status: DC
  Administered 2018-06-10 – 2018-06-12 (×7): 80 mg via ORAL
  Filled 2018-06-10 (×8): qty 1

## 2018-06-10 MED ORDER — PRENATAL MULTIVITAMIN CH
1.0000 | ORAL_TABLET | Freq: Every day | ORAL | Status: DC
  Administered 2018-06-10 – 2018-06-11 (×2): 1 via ORAL
  Filled 2018-06-10 (×2): qty 1

## 2018-06-10 MED ORDER — SIMETHICONE 80 MG PO CHEW: 80.0000 mg | CHEWABLE_TABLET | ORAL | Status: DC | PRN

## 2018-06-10 MED ORDER — CEFAZOLIN SODIUM-DEXTROSE 2-4 GM/100ML-% IV SOLN: 2.0000 g | INTRAVENOUS | Status: DC

## 2018-06-10 MED ORDER — SODIUM CHLORIDE 0.9% FLUSH: 3.0000 mL | INTRAVENOUS | Status: DC | PRN

## 2018-06-10 MED ORDER — PHENYLEPHRINE HCL 10 MG/ML IJ SOLN
INTRAMUSCULAR | Status: DC | PRN
  Administered 2018-06-10 (×3): 80 ug via INTRAVENOUS

## 2018-06-10 MED ORDER — OXYTOCIN 10 UNIT/ML IJ SOLN
INTRAVENOUS | Status: DC | PRN
  Administered 2018-06-10: 40 [IU] via INTRAVENOUS

## 2018-06-10 MED ORDER — SCOPOLAMINE 1 MG/3DAYS TD PT72
1.0000 | MEDICATED_PATCH | Freq: Once | TRANSDERMAL | Status: DC
  Filled 2018-06-10: qty 1

## 2018-06-10 MED ORDER — KETOROLAC TROMETHAMINE 30 MG/ML IJ SOLN
30.0000 mg | Freq: Four times a day (QID) | INTRAMUSCULAR | Status: AC | PRN
  Administered 2018-06-10: 30 mg via INTRAMUSCULAR

## 2018-06-10 MED ORDER — DEXAMETHASONE SODIUM PHOSPHATE 4 MG/ML IJ SOLN
INTRAMUSCULAR | Status: AC
  Filled 2018-06-10: qty 1

## 2018-06-10 MED ORDER — WITCH HAZEL-GLYCERIN EX PADS: 1.0000 "application " | MEDICATED_PAD | CUTANEOUS | Status: DC | PRN

## 2018-06-10 MED ORDER — DEXAMETHASONE SODIUM PHOSPHATE 4 MG/ML IJ SOLN
INTRAMUSCULAR | Status: DC | PRN
  Administered 2018-06-10: 4 mg via INTRAVENOUS

## 2018-06-10 MED ORDER — ACETAMINOPHEN 500 MG PO TABS
1000.0000 mg | ORAL_TABLET | Freq: Four times a day (QID) | ORAL | Status: AC
  Administered 2018-06-10 – 2018-06-11 (×4): 1000 mg via ORAL
  Filled 2018-06-10 (×4): qty 2

## 2018-06-10 MED ORDER — MEPERIDINE HCL 25 MG/ML IJ SOLN: 6.2500 mg | INTRAMUSCULAR | Status: DC | PRN

## 2018-06-10 MED ORDER — SODIUM CHLORIDE 0.9 % IV SOLN
INTRAVENOUS | Status: DC | PRN
  Administered 2018-06-10: 04:00:00 via INTRAVENOUS

## 2018-06-10 MED ORDER — HYDROMORPHONE HCL 1 MG/ML IJ SOLN: 0.2500 mg | INTRAMUSCULAR | Status: DC | PRN

## 2018-06-10 MED ORDER — DIPHENHYDRAMINE HCL 25 MG PO CAPS: 25.0000 mg | ORAL_CAPSULE | ORAL | Status: DC | PRN

## 2018-06-10 SURGICAL SUPPLY — 40 items
BARRIER ADHS 3X4 INTERCEED (GAUZE/BANDAGES/DRESSINGS) IMPLANT
BENZOIN TINCTURE PRP APPL 2/3 (GAUZE/BANDAGES/DRESSINGS) ×3 IMPLANT
CHLORAPREP W/TINT 26ML (MISCELLANEOUS) ×3 IMPLANT
CLAMP CORD UMBIL (MISCELLANEOUS) IMPLANT
CLOSURE WOUND 1/2 X4 (GAUZE/BANDAGES/DRESSINGS) ×1
CLOTH BEACON ORANGE TIMEOUT ST (SAFETY) ×3 IMPLANT
DRSG OPSITE POSTOP 4X10 (GAUZE/BANDAGES/DRESSINGS) ×3 IMPLANT
ELECT REM PT RETURN 9FT ADLT (ELECTROSURGICAL) ×3
ELECTRODE REM PT RTRN 9FT ADLT (ELECTROSURGICAL) ×1 IMPLANT
EXTRACTOR VACUUM KIWI (MISCELLANEOUS) IMPLANT
GLOVE BIO SURGEON STRL SZ 6.5 (GLOVE) ×2 IMPLANT
GLOVE BIO SURGEONS STRL SZ 6.5 (GLOVE) ×1
GLOVE BIOGEL PI IND STRL 7.0 (GLOVE) ×2 IMPLANT
GLOVE BIOGEL PI INDICATOR 7.0 (GLOVE) ×4
GOWN STRL REUS W/TWL LRG LVL3 (GOWN DISPOSABLE) ×6 IMPLANT
KIT ABG SYR 3ML LUER SLIP (SYRINGE) IMPLANT
NEEDLE HYPO 22GX1.5 SAFETY (NEEDLE) IMPLANT
NEEDLE HYPO 25X5/8 SAFETYGLIDE (NEEDLE) IMPLANT
NS IRRIG 1000ML POUR BTL (IV SOLUTION) ×3 IMPLANT
PACK C SECTION WH (CUSTOM PROCEDURE TRAY) ×3 IMPLANT
PAD OB MATERNITY 4.3X12.25 (PERSONAL CARE ITEMS) ×3 IMPLANT
PENCIL SMOKE EVAC W/HOLSTER (ELECTROSURGICAL) ×3 IMPLANT
RETRACTOR WND ALEXIS 25 LRG (MISCELLANEOUS) IMPLANT
RTRCTR WOUND ALEXIS 25CM LRG (MISCELLANEOUS)
STRIP CLOSURE SKIN 1/2X4 (GAUZE/BANDAGES/DRESSINGS) ×2 IMPLANT
SUT MNCRL 0 VIOLET CTX 36 (SUTURE) ×3 IMPLANT
SUT MON AB 2-0 CT1 27 (SUTURE) ×6 IMPLANT
SUT MONOCRYL 0 CTX 36 (SUTURE) ×6
SUT PLAIN 2 0 (SUTURE) ×2
SUT PLAIN ABS 2-0 CT1 27XMFL (SUTURE) ×1 IMPLANT
SUT VIC AB 0 CT1 36 (SUTURE) ×12 IMPLANT
SUT VIC AB 2-0 CT1 27 (SUTURE) ×2
SUT VIC AB 2-0 CT1 TAPERPNT 27 (SUTURE) ×1 IMPLANT
SUT VIC AB 3-0 CT1 27 (SUTURE) ×4
SUT VIC AB 3-0 CT1 TAPERPNT 27 (SUTURE) ×2 IMPLANT
SUT VIC AB 4-0 PS2 27 (SUTURE) ×6 IMPLANT
SYR CONTROL 10ML LL (SYRINGE) IMPLANT
TOWEL OR 17X24 6PK STRL BLUE (TOWEL DISPOSABLE) ×3 IMPLANT
TRAY FOLEY W/BAG SLVR 14FR LF (SET/KITS/TRAYS/PACK) IMPLANT
WATER STERILE IRR 1000ML POUR (IV SOLUTION) ×3 IMPLANT

## 2018-06-10 NOTE — Transfer of Care (Signed)
Immediate Anesthesia Transfer of Care Note  Patient: Carolyn Joseph  Procedure(s) Performed: CESAREAN SECTION (N/A Abdomen)  Patient Location: PACU  Anesthesia Type:Spinal  Level of Consciousness: awake, alert  and oriented  Airway & Oxygen Therapy: Patient Spontanous Breathing  Post-op Assessment: Report given to RN and Post -op Vital signs reviewed and stable  Post vital signs: Reviewed and stable  Last Vitals:  Vitals Value Taken Time  BP    Temp    Pulse 84 06/10/2018  4:49 AM  Resp    SpO2 97 % 06/10/2018  4:49 AM  Vitals shown include unvalidated device data.  Last Pain:  Vitals:   06/10/18 0117  TempSrc:   PainSc: 10-Worst pain ever         Complications: No apparent anesthesia complications

## 2018-06-10 NOTE — Anesthesia Procedure Notes (Signed)
Spinal  Patient location during procedure: OB Start time: 06/10/2018 3:09 AM End time: 06/10/2018 3:16 AM Staffing Anesthesiologist: Trevor Iha, MD Performed: anesthesiologist  Preanesthetic Checklist Completed: patient identified, surgical consent, pre-op evaluation, timeout performed, IV checked, risks and benefits discussed and monitors and equipment checked Spinal Block Patient position: sitting Prep: site prepped and draped and DuraPrep Patient monitoring: heart rate, cardiac monitor, continuous pulse ox and blood pressure Approach: midline Location: L3-4 Injection technique: single-shot Needle Needle type: Pencan  Needle gauge: 24 G Needle length: 10 cm Needle insertion depth: 7 cm Assessment Sensory level: T4 Additional Notes 1 Attempt (s). Pt tolerated procedure well.

## 2018-06-10 NOTE — Op Note (Addendum)
Carolyn Joseph PROCEDURE DATE: 06/10/2018  PREOPERATIVE DIAGNOSES: Intrauterine pregnancy at [redacted]w[redacted]d weeks gestation; malpresentation: breech, IUGR, labor  POSTOPERATIVE DIAGNOSES: The same  PROCEDURE: Primary Low Transverse Cesarean Section  SURGEON:  Dr. Scheryl Darter  ASSISTANT:  Gwenevere Abbot, MD   ANESTHESIOLOGY TEAM: Anesthesiologist: Trevor Iha, MD CRNA: Armanda Heritage, CRNA  INDICATIONS: Carolyn Joseph is a 28 y.o. [phone] at [redacted]w[redacted]d here for cesarean section secondary to the indications listed under preoperative diagnoses; please see preoperative note for further details.  The risks of cesarean section were discussed with the patient including but were not limited to: bleeding which may require transfusion or reoperation; infection which may require antibiotics; injury to bowel, bladder, ureters or other surrounding organs; injury to the fetus; need for additional procedures including hysterectomy in the event of a life-threatening hemorrhage; placental abnormalities wth subsequent pregnancies, incisional problems, thromboembolic phenomenon and other postoperative/anesthesia complications.   The patient concurred with the proposed plan, giving informed written consent for the procedure.    FINDINGS:  Viable female infant in cephalic presentation.  Apgars 4 and 8.  Clear amniotic fluid.  Intact placenta, three vessel cord.  Normal uterus, fallopian tubes and ovaries bilaterally.  ANESTHESIA: Spinal  INTRAVENOUS FLUIDS: 2000 ml   ESTIMATED BLOOD LOSS: 433 ml URINE OUTPUT:  150 ml SPECIMENS: Placenta sent to L&D COMPLICATIONS: None immediate CORD GAS: 7.278  PROCEDURE IN DETAIL:  The patient preoperatively received intravenous antibiotics and had sequential compression devices applied to her lower extremities.  She was then taken to the operating room where spinal anesthesia was administered and was found to be adequate. She was then placed in a dorsal  supine position with a leftward tilt, and prepped and draped in a sterile manner.  A foley catheter was placed into her bladder and attached to constant gravity.  After an adequate timeout was performed, a Pfannenstiel skin incision was made with scalpel and carried through to the underlying layer of fascia. The fascia was incised in the midline, and this incision was extended bilaterally using the Mayo scissors.  Kocher clamps were applied to the superior aspect of the fascial incision and the underlying rectus muscles were dissected off bluntly and sharply.  A similar process was carried out on the inferior aspect of the fascial incision. The rectus muscles were separated in the midline and the peritoneum was entered bluntly. The Alexis self-retaining retractor was introduced into the abdominal cavity.  Attention was turned to the lower uterine segment where a low transverse hysterotomy was made with a scalpel and extended bilaterally bluntly.  The infant was successfully delivered, the cord was clamped and cut after one minute, and the infant was handed over to the awaiting neonatology team. Uterine massage was then administered, and the placenta delivered intact with a three-vessel cord. The uterus was then cleared of clots and debris.  The hysterotomy was closed with 0 Vicryl in a running locked fashion, and an imbricating layer was also placed with 0 Vicryl.  The pelvis was cleared of all clot and debris. Hemostasis was confirmed on all surfaces.  The retractor was removed.  The peritoneum was closed with a 0 Vicryl running stitch. The fascia was then closed using 0 Vicryl in a running fashion.  The subcutaneous layer was irrigated, reapproximated with 2-0 plain gut interrupted stitches, and the skin was closed with a 4-0 Vicryl subcuticular stitch. The patient tolerated the procedure well. Sponge, instrument and needle counts were correct x 3.  She was taken  to the recovery room in stable condition.     Gwenevere Abbot, MD  Ob Fellow, [facility] for [employer], [facility]

## 2018-06-10 NOTE — Anesthesia Postprocedure Evaluation (Signed)
Anesthesia Post Note  Patient: Carolyn Joseph  Procedure(s) Performed: CESAREAN SECTION (N/A Abdomen)     Patient location during evaluation: Mother Baby Anesthesia Type: Spinal Level of consciousness: oriented and awake and alert Pain management: pain level controlled Vital Signs Assessment: post-procedure vital signs reviewed and stable Respiratory status: spontaneous breathing and respiratory function stable Cardiovascular status: blood pressure returned to baseline and stable Postop Assessment: no headache, no backache, no apparent nausea or vomiting and able to ambulate Anesthetic complications: no    Last Vitals:  Vitals:   06/10/18 0550 06/10/18 0551  BP:    Pulse: 70 70  Resp: 17 17  Temp:    SpO2: 99% 98%    Last Pain:  Vitals:   06/10/18 0550  TempSrc:   PainSc: 1    Pain Goal:      LLE Sensation: Increased (06/10/18 0545)   RLE Sensation: Increased (06/10/18 0545) L Sensory Level: L2-Upper inner thigh, upper buttock (06/10/18 0545) R Sensory Level: L2-Upper inner thigh, upper buttock (06/10/18 0545)    Trevor Iha

## 2018-06-10 NOTE — H&P (Signed)
Obstetric Preoperative History and Physical  Carolyn Joseph Chrystine Oiler is a 28 y.o. [id number] with IUP at [redacted]w[redacted]d presenting with labor and breech presentation.  Regular contractions with cervical change from 1 to 4cm.  Discussed cesarean section delivery, which patient is agreeable.  Reports good fetal movement, no bleeding, no leaking of fluid.  No acute preoperative concerns.    Cesarean Section Indication: malpresentation: breech  Prenatal Course Source of Care: [facility]  with onset of care at 15 weeks Pregnancy complications or risks: Patient Active Problem List   Diagnosis Date Noted  . Malpresentation of fetus 06/10/2018  . IUGR (intrauterine growth restriction) affecting care of mother 06/09/2018  . Cervical cerclage suture present 06/09/2018  . Malpresentation before onset of labor 06/09/2018  . Supervision of high risk pregnancy, antepartum 01/09/2018  . 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   Prenatal labs and studies: ABO, Rh: --/--/O POS, O POS Performed at [facility], [address], [place], [place] [address]  [phone]) Antibody: NEG (03/16 1147) Rubella: Immune (10/03 0000) RPR: Non Reactive (01/15 0840)  HBsAg: Negative (10/03 0000)  HIV: Non Reactive (01/15 0840)  GBS:  pending on admission 2 hr Glucola:  69/129/84 Genetic screening normal Anatomy US normal  Prenatal Transfer Tool  Maternal Diabetes: No Genetic Screening: Normal Maternal Ultrasounds/Referrals: Normal Fetal Ultrasounds or other Referrals:  None Maternal Substance Abuse:  No Significant Maternal Medications:  None Significant Maternal Lab Results: None  Past Medical History:  Diagnosis Date  . Hip fracture (HCC) 2015  . Ovarian cyst     Past Surgical History:  Procedure Laterality Date  . CERVICAL CERCLAGE N/A 01/20/2018   Procedure: CERCLAGE CERVICAL;   Surgeon: Hermina Staggers, MD;  Location: [facility] BIRTHING SUITES;  Service: Gynecology;  Laterality: N/A;  . HIP FRACTURE SURGERY Bilateral 2015    OB History  Gravida Para Term Preterm AB Living  3 0 0 0 2 0  SAB TAB Ectopic Multiple Live Births  2 0 0 0 0    # Outcome Date GA Lbr Len/2nd Weight Sex Delivery Anes PTL Lv  3 Current           2 SAB 08/13/15 [redacted]w[redacted]d         1 SAB 01/12/14 [redacted]w[redacted]d      N FD    Social History   Socioeconomic History  . Marital status: Married    Spouse name: Not on file  . Number of children: Not on file  . Years of education: Not on file  . Highest education level: High school graduate  Occupational History  . Not on file  Social Needs  . Financial resource strain: Not on file  . Food insecurity:    Worry: Never true    Inability: Never true  . Transportation needs:    Medical: No    Non-medical: No  Tobacco Use  . Smoking status: Former Smoker    Types: Cigarettes  . Smokeless tobacco: Never Used  . Tobacco comment: in high school  Substance and Sexual Activity  . Alcohol use: Not Currently    Comment: 2018  . Drug use: Never  . Sexual activity: Yes    Birth control/protection: Condom  Lifestyle  . Physical activity:    Days per week: Not on file    Minutes per session: Not on file  . Stress: Not on file  Relationships  .  Social connections:    Talks on phone: Not on file    Gets together: Not on file    Attends religious service: Not on file    Active member of club or organization: Not on file    Attends meetings of clubs or organizations: Not on file    Relationship status: Not on file  Other Topics Concern  . Not on file  Social History Narrative  . Not on file    Family History  Problem Relation Age of Onset  . Diabetes Mother   . Hypertension Mother   . Diabetes Maternal Grandmother   . Asthma Neg Hx   . Cancer Neg Hx   . Heart disease Neg Hx   . Stroke Neg Hx     Medications Prior to Admission  Medication Sig  Dispense Refill Last Dose  . Prenatal Vit-Fe Fumarate-FA (PRENATAL MULTIVITAMIN) TABS tablet Take 1 tablet by mouth daily at 12 noon.   06/09/2018 at Unknown time  . zolpidem (AMBIEN) 5 MG tablet Take 1 tablet (5 mg total) by mouth at bedtime as needed for sleep. 6 tablet 0     No Known Allergies  Review of Systems: Pertinent items noted in HPI and remainder of comprehensive ROS otherwise negative.  Physical Exam: BP (!) 132/96 (BP Location: Right Arm)   Pulse 81   Temp 97.7 F (36.5 C) (Oral)   Resp 15   Ht 5\' 5"  (1.651 m)   Wt 79.7 kg   LMP 09/22/2017   BMI 29.22 kg/m  FHR by Doppler: 140s bpm CONSTITUTIONAL: Well-developed, well-nourished female in no acute distress.  HENT:  Normocephalic, atraumatic, External right and left ear normal. Oropharynx is clear and moist EYES: Conjunctivae and EOM are normal. Pupils are equal, round, and reactive to light. No scleral icterus.  NECK: Normal range of motion, supple, no masses SKIN: Skin is warm and dry. No rash noted. Not diaphoretic. No erythema. No pallor. NEUROLGIC: Alert and oriented to person, place, and time. Normal reflexes, muscle tone coordination. No cranial nerve deficit noted. PSYCHIATRIC: Normal mood and affect. Normal behavior. Normal judgment and thought content. CARDIOVASCULAR: Normal heart rate noted, regular rhythm RESPIRATORY: Effort and breath sounds normal, no problems with respiration noted ABDOMEN: Soft, nontender, nondistended, gravid.  PELVIC: Deferred MUSCULOSKELETAL: Normal range of motion. No edema and no tenderness. 2+ distal pulses.   Pertinent Labs/Studies:   Results for orders placed or performed during the hospital encounter of 06/09/18 (from the past 72 hour(s))  Type and screen [facility]     Status: None   Collection Time: 06/09/18 11:47 AM  Result Value Ref Range   ABO/RH(D) O POS    Antibody Screen NEG    Sample Expiration      06/12/2018 Performed at [facility], [address], [place], [place] [address]   ABO/Rh     Status: None   Collection Time: 06/09/18 11:47 AM  Result Value Ref Range   ABO/RH(D)      O POS Performed at [facility], [address], [place], [place] [address]   CBC     Status: Abnormal   Collection Time: 06/09/18 11:56 AM  Result Value Ref Range   WBC 10.8 (H) 4.0 - 10.5 K/uL   RBC 4.49 3.87 - 5.11 MIL/uL   Hemoglobin 13.2 12.0 - 15.0 g/dL   HCT 41.9 62.2 - 29.7 %   MCV 88.4 80.0 - 100.0 fL   MCH 29.4 26.0 -  34.0 pg   MCHC 33.2 30.0 - 36.0 g/dL   RDW 16.1 09.6 - 04.5 %   Platelets 176 150 - 400 K/uL   nRBC 0.0 0.0 - 0.2 %    Comment: Performed at [facility], [address] [place], [place] [address]    Assessment and Plan :Tynetta Vanessa Kick is a 28 y.o. G3P0020 at [redacted]w[redacted]d being admitted for unscheduled cesarean section. The risks of cesarean section discussed with the patient included but were not limited to: bleeding which may require transfusion or reoperation; infection which may require antibiotics; injury to bowel, bladder, ureters or other surrounding organs; injury to the fetus; need for additional procedures including hysterectomy in the event of a life-threatening hemorrhage; placental abnormalities wth subsequent pregnancies, incisional problems, thromboembolic phenomenon and other postoperative/anesthesia complications. The patient concurred with the proposed plan, giving informed written consent for the procedure. Patient has been NPO since last night she will remain NPO for procedure. Anesthesia and OR aware. Preoperative prophylactic antibiotics and SCDs ordered on call to the OR. To OR when ready.    Gwenevere Abbot, MD Ob Fellow, [facility] for [employer], [facility]

## 2018-06-10 NOTE — Anesthesia Preprocedure Evaluation (Addendum)
Anesthesia Evaluation  Patient identified by MRN, date of birth, ID band Patient awake    Reviewed: Allergy & Precautions, NPO status , Patient's Chart, lab work & pertinent test results  Airway Mallampati: II  TM Distance: >3 FB Neck ROM: Full    Dental no notable dental hx. (+) Teeth Intact   Pulmonary former smoker,    Pulmonary exam normal breath sounds clear to auscultation       Cardiovascular Exercise Tolerance: Good negative cardio ROS Normal cardiovascular exam Rhythm:Regular Rate:Normal     Neuro/Psych negative neurological ROS     GI/Hepatic negative GI ROS,   Endo/Other  negative endocrine ROS  Renal/GU      Musculoskeletal   Abdominal   Peds  Hematology Hgb 13.1 Plts 191  T&S available   Anesthesia Other Findings   Reproductive/Obstetrics (+) Pregnancy                            Anesthesia Physical Anesthesia Plan  ASA: II  Anesthesia Plan: Spinal   Post-op Pain Management:    Induction:   PONV Risk Score and Plan: 3 and Treatment may vary due to age or medical condition, Ondansetron and Dexamethasone  Airway Management Planned: Nasal Cannula and Natural Airway  Additional Equipment:   Intra-op Plan:   Post-operative Plan:   Informed Consent: I have reviewed the patients History and Physical, chart, labs and discussed the procedure including the risks, benefits and alternatives for the proposed anesthesia with the patient or authorized representative who has indicated his/her understanding and acceptance.     Dental advisory given  Plan Discussed with:   Anesthesia Plan Comments: (Primary C/S for breech presentation)       Anesthesia Quick Evaluation

## 2018-06-10 NOTE — MAU Note (Signed)
Pt reports contractions every 5 mins. Denies LOF. Reports a small amount of vaginal bleeding. Reports good fetal movement. Cervix 1cm this morning. Baby is breech and suppose to be scheduled for c/s at 39 weeks

## 2018-06-10 NOTE — Lactation Note (Signed)
This note was copied from a baby's chart. Lactation Consultation Note: Asked by RN to assist with latch due to baby's BS is 37 and she is sleepy at the breast. RN had spoon fed Colostrum before I came in. Baby waking and latched well and nursed for 14 min. Off the sleep. Dad going to do skin to skin with baby as mom is very sleepy. No questions at present. BF brochure given to mom. Will need review as she is very sleepy. To call for assist prn.  Patient Name: Girl Jamirra Vanessa Kick HQION'G Date: 06/10/2018 Reason for consult: Initial assessment;Early term 37-38.6wks;Infant < 6lbs;1st time breastfeeding   Maternal Data Formula Feeding for Exclusion: No Has patient been taught Hand Expression?: Yes Does the patient have breastfeeding experience prior to this delivery?: No  Feeding Feeding Type: Breast Fed  LATCH Score Latch: Grasps breast easily, tongue down, lips flanged, rhythmical sucking.  Audible Swallowing: A few with stimulation  Type of Nipple: Everted at rest and after stimulation  Comfort (Breast/Nipple): Soft / non-tender  Hold (Positioning): Assistance needed to correctly position infant at breast and maintain latch.  LATCH Score: 8  Interventions Interventions: Breast feeding basics reviewed;Assisted with latch;Skin to skin;Hand express;Breast compression;Support pillows  Lactation Tools Discussed/Used     Consult Status Consult Status: Follow-up Date: 06/11/18 Follow-up type: In-patient    Pamelia Hoit 06/10/2018, 8:12 AM

## 2018-06-11 LAB — CBC WITH DIFFERENTIAL/PLATELET
Abs Immature Granulocytes: 0.08 10*3/uL — ABNORMAL HIGH (ref 0.00–0.07)
Basophils Absolute: 0 10*3/uL (ref 0.0–0.1)
Basophils Relative: 0 %
Eosinophils Absolute: 0.1 10*3/uL (ref 0.0–0.5)
Eosinophils Relative: 0 %
HCT: 32.3 % — ABNORMAL LOW (ref 36.0–46.0)
Hemoglobin: 10.4 g/dL — ABNORMAL LOW (ref 12.0–15.0)
Immature Granulocytes: 1 %
Lymphocytes Relative: 16 %
Lymphs Abs: 2.7 10*3/uL (ref 0.7–4.0)
MCH: 28.7 pg (ref 26.0–34.0)
MCHC: 32.2 g/dL (ref 30.0–36.0)
MCV: 89.2 fL (ref 80.0–100.0)
Monocytes Absolute: 1.1 10*3/uL — ABNORMAL HIGH (ref 0.1–1.0)
Monocytes Relative: 6 %
NEUTROS PCT: 77 %
Neutro Abs: 12.9 10*3/uL — ABNORMAL HIGH (ref 1.7–7.7)
Platelets: 144 10*3/uL — ABNORMAL LOW (ref 150–400)
RBC: 3.62 MIL/uL — AB (ref 3.87–5.11)
RDW: 15.1 % (ref 11.5–15.5)
WBC: 16.8 10*3/uL — ABNORMAL HIGH (ref 4.0–10.5)
nRBC: 0 % (ref 0.0–0.2)

## 2018-06-11 LAB — STREP GP B NAA: Strep Gp B NAA: POSITIVE — AB

## 2018-06-11 NOTE — Progress Notes (Signed)
POSTPARTUM PROGRESS NOTE  POD #1  Subjective:  Carolyn Joseph is a 28 y.o. [id number] s/p pLTCS at [redacted]w[redacted]d.  She reports she doing well. No acute events overnight. She reports she is doing well. She denies any problems with ambulating, voiding or po intake. Denies nausea or vomiting. She has  passed flatus. Pain is well controlled.  Lochia is appropriate.  Objective: Blood pressure 113/80, pulse 89, temperature 98.4 F (36.9 C), temperature source Oral, resp. rate 18, height 5\' 5"  (1.651 m), weight 79.7 kg, last menstrual period 09/22/2017, SpO2 98 %, unknown if currently breastfeeding.  Physical Exam:  General: alert, cooperative and no distress Chest: no respiratory distress Heart:regular rate, distal pulses intact Abdomen: soft, nontender,  Uterine Fundus: firm, appropriately tender DVT Evaluation: No calf swelling or tenderness Extremities: No LE edema Skin: warm, dry; incision clean/dry/intact w/ honeycomb dressing in place  Recent Labs    06/10/18 0702 06/11/18 0539  HGB 12.0 10.4*  HCT 35.4* 32.3*    Assessment/Plan: Carolyn Joseph Carolyn Joseph is a 28 y.o. [id number] s/p pLTCS at [redacted]w[redacted]d for malpresentation s/p failed ECV.  POD#1 - Doing welll; pain controlled. H/H appropriate  Routine postpartum care  OOB, ambulated  Lovenox for VTE prophylaxis Contraception: undecided  Feeding: breast  Dispo: Plan for discharge POD#2 or #3.   LOS: 1 day   Marcy Siren, D.O. OB Fellow  06/11/2018, 6:02 PM

## 2018-06-11 NOTE — Anesthesia Postprocedure Evaluation (Signed)
Anesthesia Post Note  Patient: Engineer, maintenance (IT)  Procedure(s) Performed: CESAREAN SECTION (N/A Abdomen)     Patient location during evaluation: Mother Baby Anesthesia Type: Spinal Level of consciousness: awake and alert and oriented Pain management: satisfactory to patient Vital Signs Assessment: post-procedure vital signs reviewed and stable Respiratory status: respiratory function stable and spontaneous breathing Cardiovascular status: blood pressure returned to baseline Postop Assessment: no headache, no backache, spinal receding, patient able to bend at knees and adequate PO intake Anesthetic complications: no    Last Vitals:  Vitals:   06/11/18 0200 06/11/18 0500  BP: 117/67 115/65  Pulse: 71 72  Resp: 18 18  Temp: 37 C 36.9 C  SpO2: 100% 99%    Last Pain:  Vitals:   06/11/18 0809  TempSrc:   PainSc: 3    Pain Goal: Patients Stated Pain Goal: 3 (06/10/18 2312)                 Karleen Dolphin

## 2018-06-11 NOTE — Addendum Note (Signed)
Addendum  created 06/11/18 1520 by Trevor Iha, MD   Intraprocedure Staff edited

## 2018-06-11 NOTE — Lactation Note (Signed)
This note was copied from a baby's chart. Lactation Consultation Note  Patient Name: Carolyn Joseph [id number] Date: 06/11/2018 Reason for consult: Follow-up assessment;Early term 37-38.6wks;Primapara;1st time breastfeeding;Infant < 6lbs  P1 mother whose infant is now 31 hours old.  This is an ETI at 37+2 weeks weighing < 5 lbs.  Mother had no questions/concerns related to breast feeding.  Reviewed the LPTI policy with parents.  Mother feels like breast feeding is going well, however, I have not observed a feeding at this time.  Mother fed baby approximately 1 hour ago.  Mother has DEBP at bedside that was set up on the night shift but she has not begun to pump yet.  Baby has also not been supplementing after breast feeding.  Due to the early gestational age and weight of baby I explained the importance of mother pumping and supplementing baby after feedings.  She is familiar with hand expression but has been unable to obtain colostrum drops.  Offered to feed baby to assess her suck and parents agreeable.  Initially, baby was not sucking.  Father stated she "does not like the milk."  In reality, baby needed some suck training and parents needed some guidance on how to bottle feed.  With cheek and jaw support baby easily fed an additional 14 mls of Neosure 22 with the yellow slow flow nipple.  Demonstrated paced bottle feeding and burping.  Father is willing to assist with the supplementation while mother post pumps.  Sat mother up at the bedside and reviewed pumping with her.  #24 flange size was not comfortable so switched her to a #27 with greater comfort.  Mother will begin pumping every three hours after breast feeding her baby.  Explained how she can feed baby EBM first and then feed the remaining minimum formula amounts with the LPTI feeding guidelines.  Both parents appreciative and voiced understanding.  Milk storage times reviewed.  Encouraged her to call her RN at the next feeding  if she cannot latch baby well.  Parents know to awaken baby every three hours to feed or sooner if she shows feeding cues.  They will limit feeding time to 30 minutes per session and allow baby to rest in between feedings.  Mother was still pumping when I left the room but was obtaining small amounts of colostrum from each breast.  She knows to include breast massage and hand expression.  She will call as needed for assistance.  RN updated.     Maternal Data Formula Feeding for Exclusion: No Has patient been taught Hand Expression?: Yes Does the patient have breastfeeding experience prior to this delivery?: No  Feeding Feeding Type: Breast Fed  LATCH Score Latch: Grasps breast easily, tongue down, lips flanged, rhythmical sucking.  Audible Swallowing: Spontaneous and intermittent  Type of Nipple: Everted at rest and after stimulation  Comfort (Breast/Nipple): Soft / non-tender  Hold (Positioning): No assistance needed to correctly position infant at breast.  LATCH Score: 10  Interventions    Lactation Tools Discussed/Used     Consult Status Consult Status: Follow-up Date: 06/12/18 Follow-up type: In-patient    Carolyn Joseph Carolyn Joseph 06/11/2018, 1:16 PM

## 2018-06-11 NOTE — Addendum Note (Signed)
Addendum  created 06/11/18 0908 by Graciela Husbands, CRNA   Clinical Note Signed

## 2018-06-11 NOTE — Lactation Note (Addendum)
This note was copied from a baby's chart. Lactation Consultation Note Baby 21 hrs old at time of consult.  Mom had attempted feeding earlier but baby wasn't interested. Sleepy. Mom holding baby on her chest covered. Discussed importance of STS. Discussed newborn behavior, feeding habits of low wt. 37 wk. newborns as well as I&O, cluster feeding, supply and demand. Gave information on supplementing according to hours of age. Encouraged to offer breast first then supplement w/colostrum/BM, then formula if needed to equal amount needed for hours of age.  Stressed importance of not stressing baby during feeding and keeping feeding time to approximately 30 min for each feeding. FOB at bedside, supportive. Both states understanding.  Hand expression demonstrated. Colostrum easily poured out of breast. Praised mom. Breast tender. Mom demonstrated hand expression and breast massage. Mom has everted nipples. Baby is able to obtain deep latch w/proper positioning. Discussed positioning options. Fed in Medical laboratory scientific officer, Psychiatrist and football.  Encouraged mom cheeks to breast to maintain deep latch. Mom frequently kept distance in fear of baby not being able to breathe. Encouraged FOB to assess if mom unable to see how close.   Taught "C" hold, breast massage, milk storage, spoon feeding, syring feeding and bottle feeding. Attempted to syring feed 3 ml colostrum. Baby wouldn't suckle on finger. Chewed and rolled tongue around. Suck training, massaging tongue to lay and cup finger. Baby wouldn't at this time.  Spoon fed, baby would take well at times, then would tongue thrust. Demonstrated cheek massage to get baby to swallow. Baby finally took 1 ml formula from slow flow nipple of Similac 22 cal.  Mom shown how to use DEBP & how to disassemble, clean, & reassemble parts. Mom knows to pump q3h for 15-20 min Mom encouraged to feed baby 8-12 times/24 hours and with feeding cues. Mom encouraged to waken baby for feeds  if hasn't cued in 3 hrs.  Mom very sleepy. Encouraged to rest while baby rest, set alarm for feeding times.  LC changed stool diaper showing FOB and mom.  RN informed of consult results.  Mom has WIC. LC faxed WIC referral for pump d/t SGA baby.    Patient Name: Girl Marelyn Vanessa Kick MVHQI'O Date: 06/11/2018 Reason for consult: Initial assessment;1st time breastfeeding;Infant < 6lbs;Early term 37-38.6wks   Maternal Data    Feeding Feeding Type: Formula Nipple Type: Slow - flow  LATCH Score Latch: Grasps breast easily, tongue down, lips flanged, rhythmical sucking.  Audible Swallowing: A few with stimulation  Type of Nipple: Everted at rest and after stimulation  Comfort (Breast/Nipple): Filling, red/small blisters or bruises, mild/mod discomfort(nipples intact/breast tender)  Hold (Positioning): Assistance needed to correctly position infant at breast and maintain latch.  LATCH Score: 7  Interventions Interventions: Breast feeding basics reviewed;Adjust position;DEBP;Assisted with latch;Support pillows;Skin to skin;Position options;Breast massage;Expressed milk;Hand express;Breast compression  Lactation Tools Discussed/Used Tools: Pump Breast pump type: Double-Electric Breast Pump WIC Program: Yes Pump Review: Setup, frequency, and cleaning;Milk Storage Initiated by:: Peri Jefferson RN IBCLC Date initiated:: 06/11/18   Consult Status Consult Status: Follow-up Date: 06/11/18 Follow-up type: In-patient    Elisha Mcgruder, Diamond Nickel 06/11/2018, 2:19 AM

## 2018-06-12 ENCOUNTER — Encounter: Payer: Self-pay | Admitting: Obstetrics and Gynecology

## 2018-06-12 ENCOUNTER — Ambulatory Visit (HOSPITAL_COMMUNITY): Payer: Self-pay

## 2018-06-12 DIAGNOSIS — O99820 Streptococcus B carrier state complicating pregnancy: Secondary | ICD-10-CM | POA: Insufficient documentation

## 2018-06-12 MED ORDER — IBUPROFEN 600 MG PO TABS: 600.0000 mg | ORAL_TABLET | Freq: Four times a day (QID) | ORAL | 0 refills | Status: DC | PRN

## 2018-06-12 MED ORDER — OXYCODONE-ACETAMINOPHEN 5-325 MG PO TABS: 1.0000 | ORAL_TABLET | ORAL | 0 refills | Status: DC | PRN

## 2018-06-12 NOTE — Progress Notes (Signed)
Patient complaining of pain with urination, CNM notified, awaiting response.

## 2018-06-12 NOTE — Discharge Instructions (Signed)

## 2018-06-12 NOTE — Lactation Note (Signed)
This note was copied from a baby's chart. Lactation Consultation Note  Patient Name: Carolyn Joseph Date: 06/12/2018   Infant has gained 20g. Mom has been pumping & BO.   WIC Loaner provided.   Lurline [facility] 06/12/2018, 1:00 PM

## 2018-06-12 NOTE — Discharge Summary (Addendum)
OB Discharge Summary     Patient Name: Carolyn Joseph DOB: 26-Sep-1990 MRN: [id number]  Date of admission: 06/10/2018 Delivering MD: Adam Phenix   Date of discharge: 06/12/2018  Admitting diagnosis: 37 WKS, CTX Intrauterine pregnancy: [redacted]w[redacted]d     Secondary diagnosis:  Active Problems:   Bicornate uterus complicating pregnancy   IUGR (intrauterine growth restriction) affecting care of mother   Malpresentation of fetus   GBS (group B Streptococcus carrier), +RV culture, currently pregnant    Discharge diagnosis: Term pregnancy delivered                                                                                         Post partum procedures:none  Hospital course:  Scheduled C/S   28 y.o. yo [id number] at [redacted]w[redacted]d was admitted to the hospital 06/10/2018 for cesarean section for breech after failed cephalic version on 3/16 and presentation in labor with contractions and cervical change from 1 to 4cm  after discharge.  Membrane Rupture Time/Date: 3:37 AM ,06/10/2018   Patient delivered a Viable infant.06/10/2018  Details of operation can be found in separate operative note.  Pateint had an uncomplicated postpartum course.  She is ambulating, tolerating a regular diet, passing flatus, and urinating well. Patient is discharged home in stable condition on  06/12/18         Physical exam  Vitals:   06/11/18 1300 06/11/18 2100 06/12/18 0603 06/12/18 0905  BP: 113/80 121/69 113/80 118/73  Pulse: 89 90 73 74  Resp: 18 18 18 16   Temp: 98.4 F (36.9 C) 98.9 F (37.2 C) 97.7 F (36.5 C) 97.8 F (36.6 C)  TempSrc: Oral Oral Oral Oral  SpO2: 98% 98%  100%  Weight:      Height:       General: alert, cooperative and no distress Lochia: appropriate Uterine Fundus: firm Incision: Healing well with no significant drainage, dried blood covering <1/3 of dressing DVT Evaluation: No cords or calf tenderness. No significant calf/ankle edema. Labs: Lab Results  Component Value Date    WBC 16.8 (H) 06/11/2018   HGB 10.4 (L) 06/11/2018   HCT 32.3 (L) 06/11/2018   MCV 89.2 06/11/2018   PLT 144 (L) 06/11/2018   CMP Latest Ref Rng & Units 06/10/2018  Creatinine 0.44 - 1.00 mg/dL 5.03    Discharge instruction: per After Visit Summary and "Baby and Me Booklet".  After visit meds:  Allergies as of 06/12/2018   No Known Allergies     Medication List    STOP taking these medications   zolpidem 5 MG tablet Commonly known as:  Ambien     TAKE these medications   ibuprofen 600 MG tablet Commonly known as:  ADVIL,MOTRIN Take 1 tablet (600 mg total) by mouth every 6 (six) hours as needed for mild pain or moderate pain.   oxyCODONE-acetaminophen 5-325 MG tablet Commonly known as:  PERCOCET/ROXICET Take 1-2 tablets by mouth every 4 (four) hours as needed for moderate pain.   prenatal multivitamin Tabs tablet Take 1 tablet by mouth daily at 12 noon.      Diet: routine diet  Activity: Advance as tolerated.  Pelvic rest for 6 weeks.   Outpatient follow up:1wk for incision check; 4wks for PP visit Follow up Appt: Future Appointments  Date Time Provider Department Center  06/24/2018  9:00 AM [facility] NURSE [facility]  07/09/2018  3:35 PM Adam Phenix, MD [facility]   Follow up Visit:No follow-ups on file.  Postpartum contraception: Nexplanon  Newborn Data: Live born female  Birth Weight: 4 lb 9.9 oz (2095 g) APGAR: 4, 8  Newborn Delivery   Birth date/time:  06/10/2018 03:40:00 Delivery type:  C-Section, Low Transverse Trial of labor:  No C-section categorization:  Primary     Baby Feeding: Breast Disposition:home with mother  Burman Nieves, MD Faculty Service, Resident   CNM attestation I have seen and examined this patient and agree with above documentation in the resident's note.   Carolyn Joseph is a 28 y.o. [id number] s/p pLTCS for malpresentation in labor.   Pain is well controlled.  Plan for birth control is Nexplanon.  Method  of Feeding: breast  PE:  BP 118/73 (BP Location: Right Arm)   Pulse 74   Temp 97.8 F (36.6 C) (Oral)   Resp 16   Ht 5\' 5"  (1.651 m)   Wt 79.7 kg   LMP 09/22/2017   SpO2 100%   Breastfeeding Unknown   BMI 29.22 kg/m  Fundus firm Inc: stained and unchanged  No results for input(s): HGB, HCT in the last 72 hours.   Plan: discharge today - postpartum care discussed - f/u clinic in 1 week for incision check, then 4 weeks for postpartum visit   Arabella Merles, CNM 10:52 AM

## 2018-06-12 NOTE — Progress Notes (Signed)
Post Partum Day #2  Subjective:  Pt is doing well this morning. Pain is well controlled. Pt has no complaints, up ad lib, voiding, tolerating PO and +flatus. Pt currently pumping.   Objective: Blood pressure 113/80, pulse 73, temperature 97.7 F (36.5 C), temperature source Oral, resp. rate 18, height 5\' 5"  (1.651 m), weight 79.7 kg, last menstrual period 09/22/2017, SpO2 98 %, unknown if currently breastfeeding.  Physical Exam:  General: alert, cooperative, appears stated age and no distress Lochia: appropriate Uterine Fundus: firm Incision: healing well, no significant drainage, no dehiscence, no significant erythema DVT Evaluation: No evidence of DVT seen on physical exam. Negative Homan's sign. No cords or calf tenderness. No significant calf/ankle edema.  Recent Labs    06/10/18 0702 06/11/18 0539  HGB 12.0 10.4*  HCT 35.4* 32.3*    Assessment/Plan: Plan for discharge tomorrow Breastfeeding MOC: Nexplanon   LOS: 2 days   Carolyn Joseph 06/12/2018, 7:35 AM

## 2018-06-16 ENCOUNTER — Encounter: Payer: Self-pay | Admitting: Obstetrics & Gynecology

## 2018-06-17 ENCOUNTER — Telehealth: Payer: Self-pay | Admitting: Student

## 2018-06-17 NOTE — Telephone Encounter (Signed)
The patient called for information on her upcoming appointment. Informed the patient of her upcoming appointment and also mailing appointment reminders. The patient asked if its ok to take the bandage off the incision. Informed yes per the nurse Jeanetta. Also educated of the COV19 restrictions and restrictions on visitors.

## 2018-06-19 ENCOUNTER — Ambulatory Visit (HOSPITAL_COMMUNITY): Payer: Self-pay

## 2018-06-22 ENCOUNTER — Encounter (HOSPITAL_COMMUNITY)
Admission: AD | Disposition: A | Discharge status: Home / Self Care | Payer: Self-pay | Source: Ambulatory Visit | Attending: Obstetrics & Gynecology

## 2018-06-22 SURGERY — Surgical Case
Anesthesia: Regional

## 2018-06-24 ENCOUNTER — Ambulatory Visit (INDEPENDENT_AMBULATORY_CARE_PROVIDER_SITE_OTHER): Payer: Self-pay

## 2018-06-24 ENCOUNTER — Other Ambulatory Visit: Payer: Self-pay

## 2018-06-24 VITALS — BP 114/71 | HR 78 | Wt 156.5 lb

## 2018-06-24 DIAGNOSIS — Z5189 Encounter for other specified aftercare: Secondary | ICD-10-CM

## 2018-06-24 MED ORDER — POLYETHYLENE GLYCOL 3350 17 G PO PACK: 17.0000 g | PACK | Freq: Every day | ORAL | 0 refills | Status: DC

## 2018-06-24 NOTE — Progress Notes (Signed)
Pt here today for an incision check s/p c-section on 06/10/18.  Pt incision looks well approximated, no drainage, no odor, and no redness.  Pt advised to continue to monitor for infection.  Pt reports that she has pressure on her bladder when she urinates.  UA resulted moderate blood, however WNL with pt still having scant bleeding from delivery.  Pt c/o passing stools.  Notified Dorathy Kinsman, CNM who recommended Miralax and to f/u at pp visit.   Notified pt of providers recommendation.  Pt verbalized understanding.

## 2018-06-25 LAB — POCT URINALYSIS DIP (DEVICE)
Bilirubin Urine: NEGATIVE
Glucose, UA: NEGATIVE mg/dL
Ketones, ur: NEGATIVE mg/dL
Nitrite: NEGATIVE
Protein, ur: NEGATIVE mg/dL
SPECIFIC GRAVITY, URINE: 1.01 (ref 1.005–1.030)
Urobilinogen, UA: 0.2 mg/dL (ref 0.0–1.0)
pH: 7 (ref 5.0–8.0)

## 2018-07-09 ENCOUNTER — Ambulatory Visit: Payer: Self-pay | Admitting: Obstetrics & Gynecology

## 2019-03-21 IMAGING — US US MFM OB FOLLOW-UP
1 series · 13 of 28 positions shown · non-contrast
Comparison: none

[Series 1: us mfm ob follow-up · 38 acquisitions, 13 frames shown]
[im 2/38]
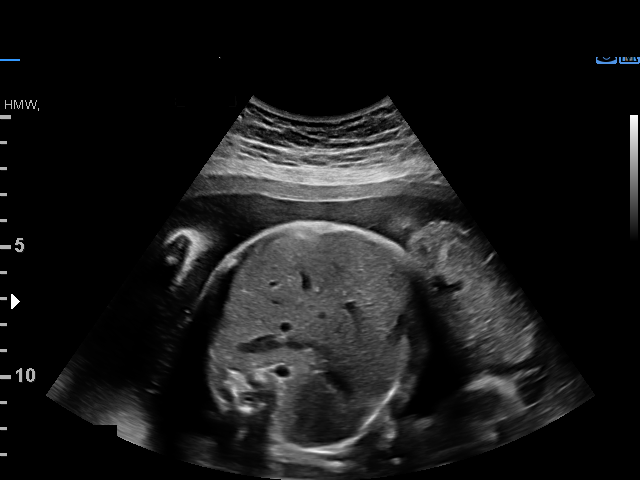
[im 5/38]
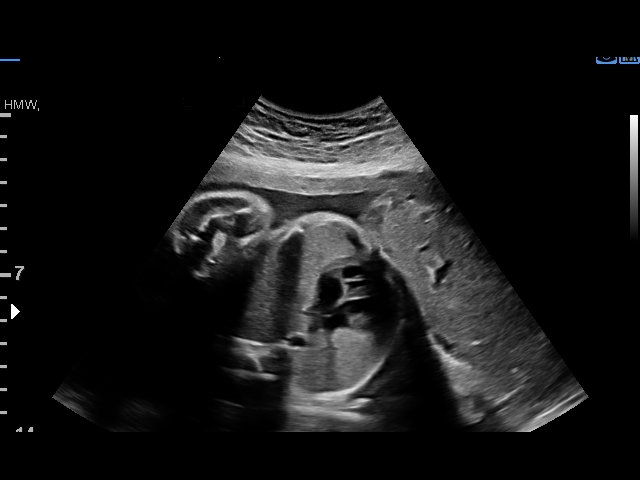
[im 7/38]
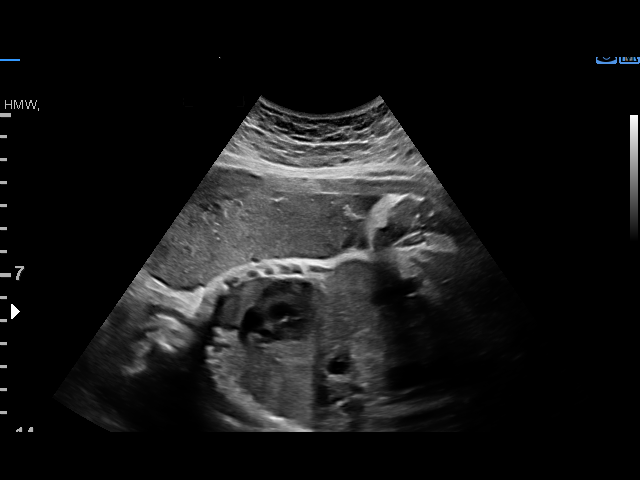
[im 10/38]
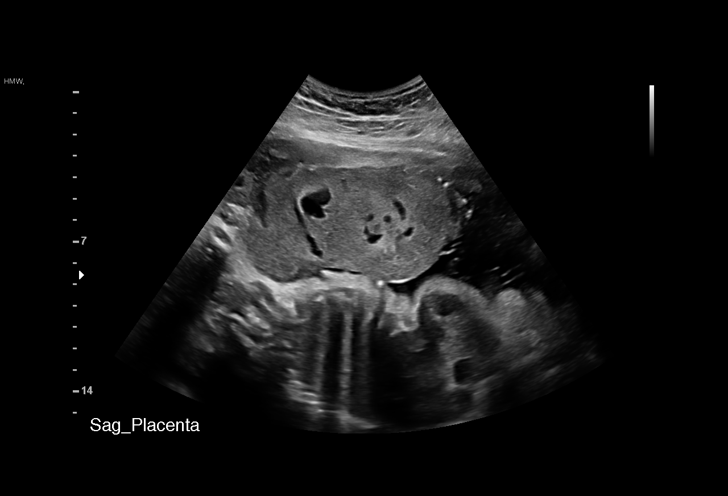
[im 13/38]
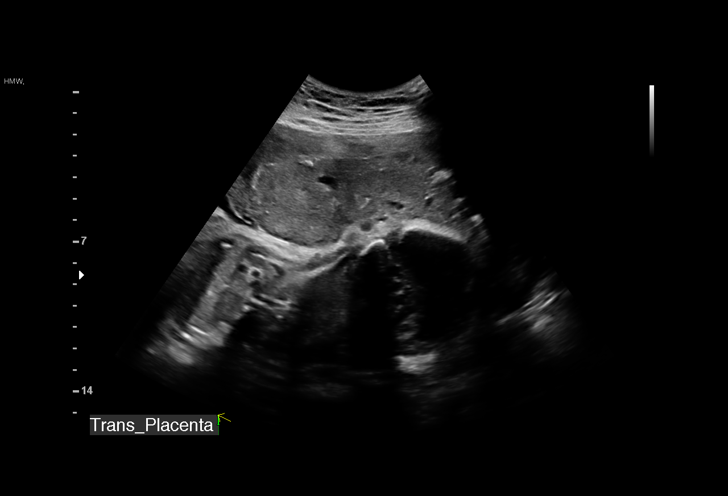
[im 16/38]
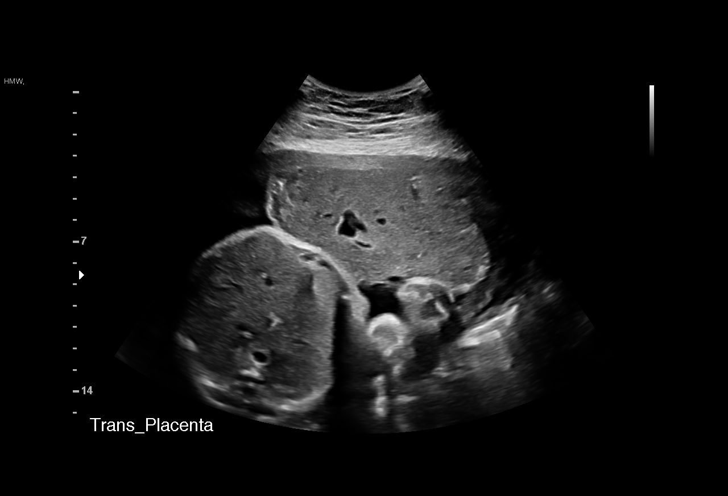
[im 20/38]
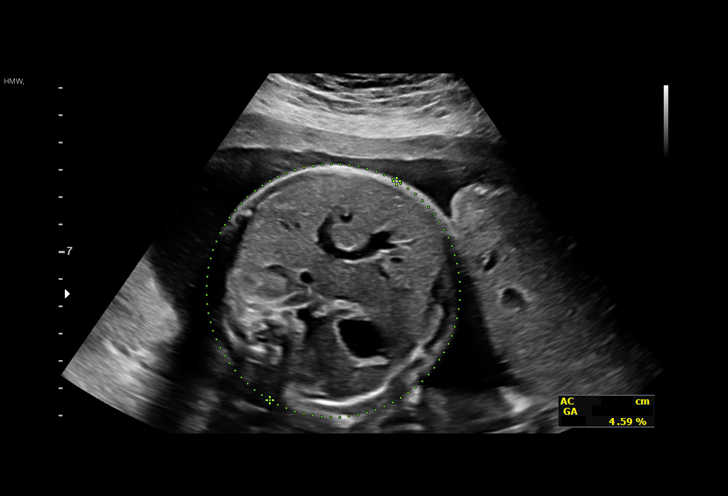
[im 22/38]
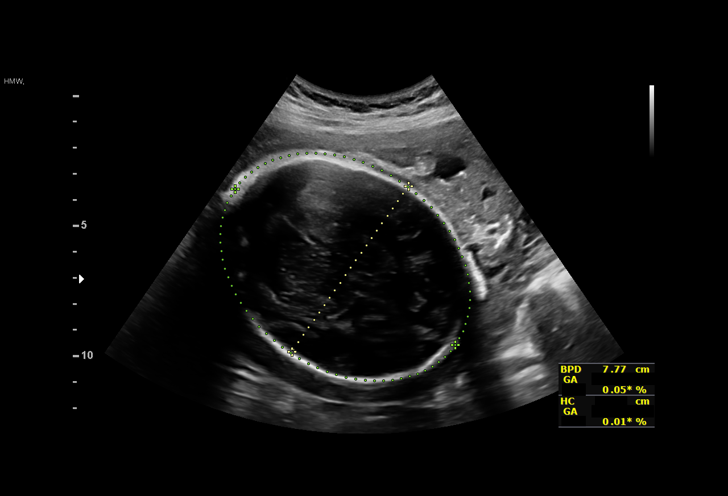
[im 25/38]
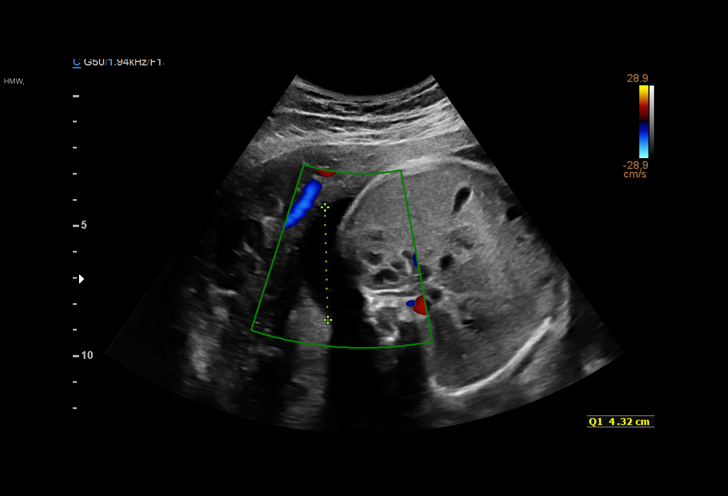
[im 28/38]
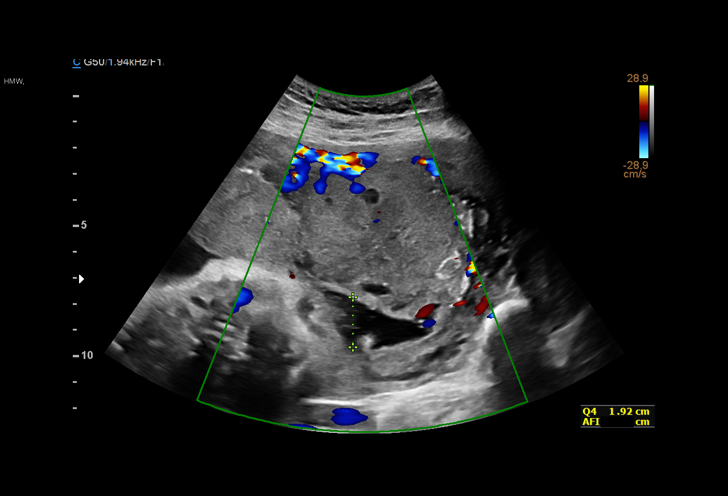
[im 31/38]
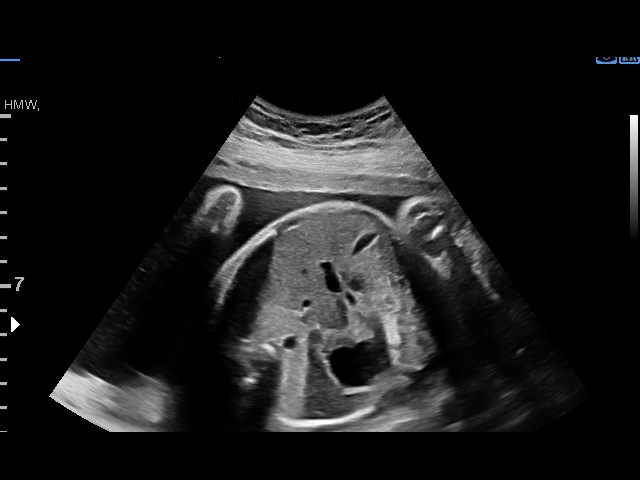
[im 33/38]
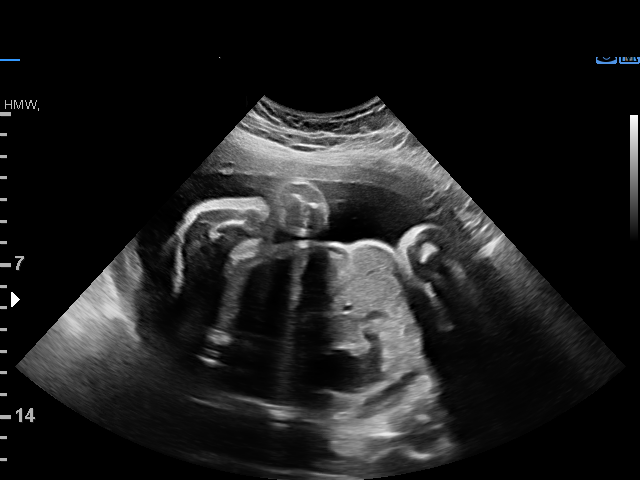
[im 36/38]
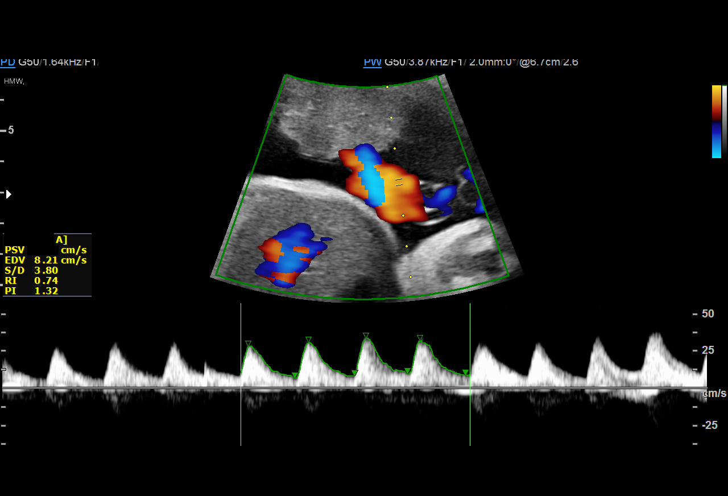

[13 of 28 positions shown; findings below may reference images not displayed]

BARELA

                   KYUNG A NP

     W/NONSTRESS
 ----------------------------------------------------------------------

 ----------------------------------------------------------------------
Indications

  Polyhydramnios, third trimester, antepartum
  condition or complication, unspecified fetus
  Poor obstetric history: Previous midtrimester
  loss (@ 90wks)
  Poor obstetric history: Previous preterm
  delivery, antepartum
  Poor obstetric history (Hx of cervical
  insufficiency)
  Cervical cerclage suture present, third
  trimester
  Decreased fetal movement
  35 weeks gestation of pregnancy
  Maternal care for known or suspected poor
  fetal growth, third trimester, not applicable or
  unspecified
 ----------------------------------------------------------------------
Vital Signs

                                                Height:        5'5"
Fetal Evaluation

 Num Of Fetuses:         1
 Fetal Heart Rate(bpm):  129
 Cardiac Activity:       Observed
 Presentation:           Breech
 Placenta:               Anterior
 P. Cord Insertion:      Previously seen as normal
 Amniotic Fluid
 AFI FV:      Within normal limits

 AFI Sum(cm)     %Tile       Largest Pocket(cm)
 15.93           58

 RUQ(cm)       RLQ(cm)       LUQ(cm)        LLQ(cm)

Biophysical Evaluation

 Amniotic F.V:   Within normal limits       F. Tone:        Observed
 F. Movement:    Observed                   N.S.T:          Reactive
 F. Breathing:   Observed                   Score:          [DATE]
Biometry

 BPD:      78.2  mm     G. Age:  31w 3d        < 1  %    CI:        74.26   %    70 - 86
                                                         FL/HC:      20.8   %    20.1 -
 HC:      288.1  mm     G. Age:  31w 5d        < 3  %    HC/AC:      0.98        0.93 -
 AC:      292.5  mm     G. Age:  33w 2d          7  %    FL/BPD:     76.7   %    71 - 87
 FL:         60  mm     G. Age:  31w 1d        < 3  %    FL/AC:      20.5   %    20 - 24
 HUM:      52.5  mm     G. Age:  30w 4d        < 5  %

 Est. FW:    4544  gm      4 lb 5 oz   < 10  %
OB History

 Gravidity:    3         Prem:   1         SAB:   1
Gestational Age

 LMP:           35w 4d        Date:  09/22/17                 EDD:   06/29/18
 U/S Today:     31w 6d                                        EDD:   07/25/18
 Best:          35w 4d     Det. By:  LMP  (09/22/17)          EDD:   06/29/18
Anatomy



 Other:  Female gender Heels and Rt 5th digit previously visualized.
         Technically difficult due to fetal position.
Doppler - Fetal Vessels

 Umbilical Artery
  S/D     %tile     RI              PI                     ADFV    RDFV
 3.44       92   0.71             1.24                         N       N
Cervix Uterus Adnexa

 Cervix
 Not visualized (advanced GA >98wks)
Impression

 Patient returned for fetal growth assessment.
 On ultrasound, amniotic fluid is normal and good fetal activity
 is seen. The estimated fetal weight is at less than the 10th
 percentile. Head circumference measurement is at -2 SD (not
 consistent with microcephaly). Umbilical artery Doppler
 showed normal forward diastolic flow. NST is reactive. BPP
 [DATE].

 I explained the finding and informed her that it is difficult to
 differentiate between a constitutionally-small fetus and the
 one with growth restriction. I recommended weekly antenatal
 testing that could identify earlier a compromised fetus.
 Timing of delivery: ACOG recommends delivery between 38w
 0d till 39w 6d gestation provided antenatal testing is
 reassuring. Long-term consequences including neurological
 outcomes cannot be predicted on the basis of antenatal
 testing.
 I recommended delivery at 39 weeks.
Recommendations

 -Continue weekly BPP, NST and Doppler.
                 Junior, Yuika

## 2019-05-07 ENCOUNTER — Other Ambulatory Visit: Payer: Self-pay

## 2019-05-07 ENCOUNTER — Encounter: Payer: Self-pay | Admitting: Obstetrics & Gynecology

## 2019-05-07 ENCOUNTER — Ambulatory Visit (INDEPENDENT_AMBULATORY_CARE_PROVIDER_SITE_OTHER): Payer: Self-pay | Admitting: *Deleted

## 2019-05-07 DIAGNOSIS — Z3200 Encounter for pregnancy test, result unknown: Secondary | ICD-10-CM

## 2019-05-07 DIAGNOSIS — Z3201 Encounter for pregnancy test, result positive: Secondary | ICD-10-CM

## 2019-05-07 LAB — POCT PREGNANCY, URINE: Preg Test, Ur: POSITIVE — AB

## 2019-05-07 NOTE — Progress Notes (Signed)
Pt presents for UPT which is positive - pt informed of result. She reports sure LMP 03/06/19 which yields EDD 12/11/19 (8w 6d). Pt also reports that she is having some dk red spotting. She denies abdominal or pelvic pain. Pt advised to go to [facility]U @ [facility] if she develops heavy bleeding or abdominal/pelvic pain. She will schedule prenatal care in this office. Pt voiced understanding of all information and instructions given.

## 2019-05-08 NOTE — Progress Notes (Signed)
Patient ID: Carolyn Joseph, female   DOB: Jul 13, 1990, 29 y.o.   MRN: [id number] Patient seen and assessed by nursing staff during this encounter. I have reviewed the chart and agree with the documentation and plan.  Scheryl Darter, MD 05/08/2019 5:38 PM

## 2019-05-18 ENCOUNTER — Encounter: Payer: Self-pay | Admitting: Family Medicine

## 2019-05-21 ENCOUNTER — Other Ambulatory Visit: Payer: Self-pay

## 2019-05-21 ENCOUNTER — Ambulatory Visit (INDEPENDENT_AMBULATORY_CARE_PROVIDER_SITE_OTHER): Payer: Self-pay | Admitting: *Deleted

## 2019-05-21 DIAGNOSIS — O0990 Supervision of high risk pregnancy, unspecified, unspecified trimester: Secondary | ICD-10-CM

## 2019-05-21 DIAGNOSIS — O09899 Supervision of other high risk pregnancies, unspecified trimester: Secondary | ICD-10-CM | POA: Insufficient documentation

## 2019-05-21 DIAGNOSIS — Z8759 Personal history of other complications of pregnancy, childbirth and the puerperium: Secondary | ICD-10-CM

## 2019-05-21 DIAGNOSIS — Q513 Bicornate uterus: Secondary | ICD-10-CM

## 2019-05-21 DIAGNOSIS — O3400 Maternal care for unspecified congenital malformation of uterus, unspecified trimester: Secondary | ICD-10-CM

## 2019-05-21 DIAGNOSIS — O09299 Supervision of pregnancy with other poor reproductive or obstetric history, unspecified trimester: Secondary | ICD-10-CM

## 2019-05-21 HISTORY — DX: Supervision of pregnancy with other poor reproductive or obstetric history, unspecified trimester: O09.299

## 2019-05-21 HISTORY — DX: Supervision of high risk pregnancy, unspecified, unspecified trimester: O09.90

## 2019-05-21 NOTE — Progress Notes (Signed)
I connected with  Carolyn Joseph on 05/21/19 at  2:30 PM EST by telephone and verified that I am speaking with the correct person using two identifiers.   I discussed the limitations, risks, security and privacy concerns of performing an evaluation and management service by telephone and the availability of in person appointments. I also discussed with the patient that there may be a patient responsible charge related to this service. The patient expressed understanding and agreed to proceed.  I explained I am completing her New OB Intake today. We discussed Her EDD and that it is based on  sure LMP . I reviewed her allergies, meds, OB History, Medical /Surgical history, and appropriate screenings. I informed her of [facility] services. 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 I instructed her if she has heavy bleeding like a period to go to the hospital for evaluation for miscarriage. I also discussed with provider if I should order cervical length [place] and plan is for that to be discussed with Kasondra at her new ob visit Monday 05/25/19. I instructed Yachet this will be discussed Monday. She has had transportation issues and I informed her I am putting transportation resources in her check out information.  I explained I will send her the Babyscripts app and app was sent to her while on phone.  I explained we will send a blood pressure cuff to [facility] if she is approved for medicaid or if she is not - may give her one- will discuss at her new ob visit. She is going to call the Health department to see if she can apply for Adopt-a-Mom or Medicaid. I  Explained  After she gets a blood pressure cuff then we will have her take her blood pressure weekly and enter into the app. I explained she will have some visits in office and some virtually. She already has Sports coach. I reviewed her new ob  appointment date/ time with her , our location and to wear mask, no visitors.  I  explained she will have a pelvic exam, ob bloodwork, hemoglobin a1C, cbg ,pap, and  genetic testing if desired,- she does want a panorama. She declines to  Have pap.  I scheduled an Korea at 19 weeks and gave her the appointment. She voices understanding.   Carolyn Merriweather,RN 05/21/2019  2:29 PM

## 2019-05-21 NOTE — Patient Instructions (Addendum)
Transportation Resources [place] [employer] (GTA) [address] [place], [place], [place] [address] https://www.[place].gov/departments/transportation/gdot-divisions/[place]-transit-agency-public-transportation-division     . Fixed-route bus services, including regional fare cards for PART, [place], [place], and [place] buses.  . Reduced fare bus ID's available for Medicaid, Medicare, and "orange card" recipients.  [employer] SCAT offers curb-to-curb and door-to-door bus services for people with disabilities who are unable to use a fixed-bus route; also offers a shared-ride program.   Helpful tips:  -Routes available online and physical maps available at the main bus hub lobby (each for a specific route) -Smartphone directions often include bus routes (see the "bus" icon, next to the "car" and "walk" icons) -Routes differ on weekends, evenings and holidays, so plan ahead!  -If you have Medicaid, Medicare, or orange card, plan to obtain a reduced-fare ID to save 50% on rides. Check days and times to obtain an ID, and bring all necessary documents.   [employer] [place]) [address] [place], [place]. [address] [place], [place] [address] [phone] SpotApps.nl **Fixed-bus route services, and demand response bus service for older adults  Department of Social [facility] [address] [place], [place] [address] [phone] www.MysterySinger.com.cy **Medicaid transportation is available to [facility] recipients who need assistance getting to [facility] medical appointments and providers  [facility] [address] [place] [address], [place], [place] [address] www.cjmedicaltransportation.com  ** Offers non-emergency transportation for medical appointments  [employer] [address] [place], [place] [address] [phone] www.wheels4hope.org **REFERRAL NEEDED by specific agencies (see website), after meeting specified criteria only  [employer] for [employer]) [address], [place], [place] [address] [phone]  BuyingShow.es  *Regional fixed-bus routes between counties (example: [place] to [place]) and [employer] of [place]  [address], [place], [place] [address] [phone] Http://senior-resources-[place].org  Museum/gallery exhibitions officer available (call or see website for details)  [facility]ior [employer]ults [facility] [facility] [address], [place], [place] [address] [phone] www.senioradults.org  **Ride coordination    Transportation Services [facility]  Aging, Disability and Transit [facility] [address] [place], [place] [address] [phone] www.adtsrc.[facility] Department of Health and [facility] [address], [place], [place] [address] [phone]  FutureSponsors.be  **Transportation expense assistance  Department of Social [place] [address], [place], [place] [address] [phone] [email or web address].[email or web address]/pview.aspx?[email or web address]  **Medicaid transportation for recipients who need assistance getting to [facility] medical appointments and providers      If you are in need of transportation to get to and from your appointments in our office.  You can reach Transportation Services by calling [phone] Monday - Friday  7am-6pm.   At [facility] [employer], we work as an integrated team, providing care to address both physical and emotional health. Your medical provider may refer you to see our Behavioral Health Clinician [facility]) on the same day you see your medical provider, as availability permits.  Our [facility] is available to all patients, visits generally last between 20-30 minutes, but can be longer or  shorter, depending on patient need. The [facility] offers help with stress management, coping with symptoms of depression and anxiety, major life changes , sleep issues, changing risky behavior, grief and loss, life stress, working on personal life goals, and  behavioral health issues, as these all affect your overall health and wellness.  The [facility] is NOT available for the following: court-ordered evaluations, specialty assessments (custody or disability), letters to employers,  or obtaining certification for an emotional support animal. The [facility] does not provide long-term therapeutic services. You have the right to refuse integrated behavioral health services, or to reschedule to see the [facility] at a later date.  Exception: If you are having thoughts of suicide, we require that you either see the [facility] for further assessment, or contract for safety with your medical provider prior to checking out.  Confidentiality exception: If it is suspected that a child or disabled adult is being abused or neglected, we are required by law to report that to either Child Protective Services or Adult Scientist, forensic.  If you have a diagnosis of Bipolar affective disorder, Schizophrenia, or recurrent Major depressive disorder, we will recommend that you establish care with a psychiatrist, as these are lifelong, chronic conditions, and we want your overall emotional health and medications to be more closely monitored. If you anticipate needing extended maternity leave due to mental illness, it it recommended that you find a psychiatrist as soon as possible. Neither the medical provider, nor the [facility], can recommend an extended maternity leave for mental health issues. Your medical provider or [facility] may refer you to a therapist for ongoing, traditional therapy, or to a psychiatrist, for medication management, if it would benefit your overall health. Depending on your insurance, you may have a copay to see the [facility]. If you are uninsured, it is  recommended that you apply for financial assistance. (Forms may be requested at the front desk for in-person visits, via MyChart, or request a form during a virtual visit).  If you see the [facility] more than 6 times, you will have to complete a comprehensive clinical assessment interview with the [facility] to resume integrated services.  Any questions?

## 2019-05-25 ENCOUNTER — Ambulatory Visit (INDEPENDENT_AMBULATORY_CARE_PROVIDER_SITE_OTHER): Payer: Self-pay | Admitting: Family Medicine

## 2019-05-25 ENCOUNTER — Other Ambulatory Visit: Payer: Self-pay

## 2019-05-25 VITALS — BP 131/64 | HR 81 | Temp 98.4°F | Wt 156.0 lb

## 2019-05-25 DIAGNOSIS — Z98891 History of uterine scar from previous surgery: Secondary | ICD-10-CM

## 2019-05-25 DIAGNOSIS — O3401 Maternal care for unspecified congenital malformation of uterus, first trimester: Secondary | ICD-10-CM

## 2019-05-25 DIAGNOSIS — Z3A10 10 weeks gestation of pregnancy: Secondary | ICD-10-CM

## 2019-05-25 DIAGNOSIS — O09299 Supervision of pregnancy with other poor reproductive or obstetric history, unspecified trimester: Secondary | ICD-10-CM

## 2019-05-25 DIAGNOSIS — O0990 Supervision of high risk pregnancy, unspecified, unspecified trimester: Secondary | ICD-10-CM

## 2019-05-25 DIAGNOSIS — O0991 Supervision of high risk pregnancy, unspecified, first trimester: Secondary | ICD-10-CM

## 2019-05-25 DIAGNOSIS — Z124 Encounter for screening for malignant neoplasm of cervix: Secondary | ICD-10-CM

## 2019-05-25 DIAGNOSIS — O09291 Supervision of pregnancy with other poor reproductive or obstetric history, first trimester: Secondary | ICD-10-CM

## 2019-05-25 DIAGNOSIS — Z113 Encounter for screening for infections with a predominantly sexual mode of transmission: Secondary | ICD-10-CM

## 2019-05-25 DIAGNOSIS — Z8759 Personal history of other complications of pregnancy, childbirth and the puerperium: Secondary | ICD-10-CM

## 2019-05-25 HISTORY — DX: History of uterine scar from previous surgery: Z98.891

## 2019-05-25 NOTE — Progress Notes (Signed)
Subjective:  Carolyn Joseph is a [id number] [redacted]w[redacted]d by 10 wk Korea, being seen today for her first obstetrical visit.  Her obstetrical history is significant for prior pregnancy with bicornuate uterus, h/o incompetent cervix with two prior losses. She had a prophylactic cerclage placed in the last pregnancy and carrier the pregnancy to full term, but IUGR. Had cesarean delivery for breech . Patient does intend to breast feed. Pregnancy history fully reviewed.  Patient reports spotting early pregnancy.  BP 131/64   Pulse 81   Temp 98.4 F (36.9 C)   Wt 156 lb (70.8 kg)   LMP 03/06/2019   BMI 25.96 kg/m   HISTORY: OB History  Gravida Para Term Preterm AB Living  4 1 1  0 2 1  SAB TAB Ectopic Multiple Live Births  2 0 0 0 1    # Outcome Date GA Lbr Len/2nd Weight Sex Delivery Anes PTL Lv  4 Current           3 Term 06/10/18 [redacted]w[redacted]d  4 lb 9.9 oz (2.095 kg) F CS-LTranv Spinal  LIV     Birth Comments: IUGR, c/s for breach  2 SAB 08/13/15 [redacted]w[redacted]d         1 SAB 01/12/14 [redacted]w[redacted]d      N FD    Past Medical History:  Diagnosis Date  . Bicornate uterus   . Hip fracture (HCC) 2015  . Ovarian cyst     Past Surgical History:  Procedure Laterality Date  . CERVICAL CERCLAGE N/A 01/20/2018   Procedure: CERCLAGE CERVICAL;  Surgeon: 01/22/2018, MD;  Location: [facility] BIRTHING SUITES;  Service: Gynecology;  Laterality: N/A;  . CESAREAN SECTION N/A 06/10/2018   Procedure: CESAREAN SECTION;  Surgeon: 06/12/2018, MD;  Location: [facility] LD ORS;  Service: Obstetrics;  Laterality: N/A;  . HIP FRACTURE SURGERY Bilateral 2015    Family History  Problem Relation Age of Onset  . Diabetes Mother   . Hypertension Mother   . Diabetes Maternal Grandmother   . Asthma Neg Hx   . Cancer Neg Hx   . Heart disease Neg Hx   . Stroke Neg Hx      Exam  BP 131/64   Pulse 81   Temp 98.4 F (36.9 C)   Wt 156 lb (70.8 kg)   LMP 03/06/2019   BMI 25.96 kg/m   Chaperone present during  exam  CONSTITUTIONAL: Well-developed, well-nourished female in no acute distress.  HENT:  Normocephalic, atraumatic, External right and left ear normal. Oropharynx is clear and moist EYES: Conjunctivae and EOM are normal. Pupils are equal, round, and reactive to light. No scleral icterus.  NECK: Normal range of motion, supple, no masses.  Normal thyroid.  CARDIOVASCULAR: Normal heart rate noted, regular rhythm RESPIRATORY: Clear to auscultation bilaterally. Effort and breath sounds normal, no problems with respiration noted. ABDOMEN: Soft, normal bowel sounds, no distention noted.  No tenderness, rebound or guarding.  PELVIC: Normal appearing external genitalia; normal appearing vaginal mucosa and cervix. No abnormal discharge noted. Normal uterine size, no other palpable masses, no uterine or adnexal tenderness. MUSCULOSKELETAL: Normal range of motion. No tenderness.  No cyanosis, clubbing, or edema.  2+ distal pulses. SKIN: Skin is warm and dry. No rash noted. Not diaphoretic. No erythema. No pallor. NEUROLOGIC: Alert and oriented to person, place, and time. Normal reflexes, muscle tone coordination. No cranial nerve deficit noted. PSYCHIATRIC: Normal mood and affect. Normal behavior. Normal judgment and thought content.    Assessment:  Pregnancy: [id number] Patient Active Problem List   Diagnosis Date Noted  . Supervision of high risk pregnancy, antepartum 05/21/2019  . History of incompetent cervix, currently pregnant 05/21/2019  . History of prior pregnancy with IUGR newborn 05/21/2019  . Short interval between pregnancies affecting pregnancy, antepartum 05/21/2019  . Bicornate uterus complicating pregnancy 66/08/3014      Plan:   1. Supervision of high risk pregnancy, antepartum FHT and FH normal Will get PNL US done. PAP done. Plans on breastfeeding Would like genetic testing done - waiting for insurance  - Culture, OB Urine - Obstetric Panel, Including HIV - Hepatitis  C Antibody - Cytology - PAP( [place])  2. History of incompetent cervix, currently pregnant Will schedule for cerclage placement bewteen 12-15 weeks  3. Uterus bicornis affecting pregnancy in first trimester  4. History of prior pregnancy with IUGR newborn Will watch fundal heights  5. History of Cesarean delivery Unsure of VBAC vs RLTCS    Problem list reviewed and updated. 75% of 30 min visit spent on counseling and coordination of care.     Truett Mainland 05/25/2019

## 2019-05-25 NOTE — Progress Notes (Signed)
Had pink spotting wks ago but now is brown spotting

## 2019-05-26 LAB — OBSTETRIC PANEL, INCLUDING HIV
Antibody Screen: NEGATIVE
Basophils Absolute: 0 10*3/uL (ref 0.0–0.2)
Basos: 0 %
EOS (ABSOLUTE): 0.1 10*3/uL (ref 0.0–0.4)
Eos: 1 %
HIV Screen 4th Generation wRfx: NONREACTIVE
Hematocrit: 37.3 % (ref 34.0–46.6)
Hemoglobin: 12.3 g/dL (ref 11.1–15.9)
Hepatitis B Surface Ag: NEGATIVE
Immature Grans (Abs): 0 10*3/uL (ref 0.0–0.1)
Immature Granulocytes: 0 %
Lymphocytes Absolute: 2.2 10*3/uL (ref 0.7–3.1)
Lymphs: 25 %
MCH: 28.8 pg (ref 26.6–33.0)
MCHC: 33 g/dL (ref 31.5–35.7)
MCV: 87 fL (ref 79–97)
Monocytes Absolute: 0.7 10*3/uL (ref 0.1–0.9)
Monocytes: 8 %
Neutrophils Absolute: 5.8 10*3/uL (ref 1.4–7.0)
Neutrophils: 66 %
Platelets: 213 10*3/uL (ref 150–450)
RBC: 4.27 x10E6/uL (ref 3.77–5.28)
RDW: 13.1 % (ref 11.7–15.4)
RPR Ser Ql: NONREACTIVE
Rh Factor: POSITIVE
Rubella Antibodies, IGG: 2.66 index (ref >0.99)
WBC: 8.8 10*3/uL (ref 3.4–10.8)

## 2019-05-26 LAB — POCT URINALYSIS DIP (DEVICE)
Bilirubin Urine: NEGATIVE
Glucose, UA: NEGATIVE mg/dL
Hgb urine dipstick: NEGATIVE
Ketones, ur: NEGATIVE mg/dL
Leukocytes,Ua: NEGATIVE
Nitrite: NEGATIVE
Protein, ur: NEGATIVE mg/dL
Specific Gravity, Urine: >=1.03 (ref 1.005–1.030)
Urobilinogen, UA: 0.2 mg/dL (ref 0.0–1.0)
pH: 6 (ref 5.0–8.0)

## 2019-05-26 LAB — HEPATITIS C ANTIBODY: Hep C Virus Ab: <0.1 s/co ratio (ref 0.0–0.9)

## 2019-05-27 ENCOUNTER — Other Ambulatory Visit: Payer: Self-pay | Admitting: Obstetrics & Gynecology

## 2019-05-27 LAB — CULTURE, OB URINE

## 2019-05-27 LAB — CYTOLOGY - PAP
Chlamydia: NEGATIVE
Comment: NEGATIVE
Comment: NORMAL
Diagnosis: NEGATIVE
Neisseria Gonorrhea: NEGATIVE

## 2019-05-27 LAB — URINE CULTURE, OB REFLEX: Organism ID, Bacteria: NO GROWTH

## 2019-05-27 NOTE — Progress Notes (Signed)
Orders for cerclage placed

## 2019-06-01 ENCOUNTER — Telehealth: Payer: Self-pay | Admitting: Obstetrics & Gynecology

## 2019-06-01 ENCOUNTER — Telehealth (HOSPITAL_COMMUNITY): Payer: Self-pay | Admitting: *Deleted

## 2019-06-01 NOTE — Telephone Encounter (Signed)
Pt states she is unable to do the cerclage on the 17th.  Will call [facility] to reschedule

## 2019-06-01 NOTE — Telephone Encounter (Signed)
Received a call from the patient stating she needed to reschedule her surgery due to childcare issues. A message was sent to [place] to reschedule patient for her cerclage. Patient was told she should be getting a call, or mailed letter about this change.

## 2019-06-08 ENCOUNTER — Other Ambulatory Visit (HOSPITAL_COMMUNITY): Payer: MEDICAID

## 2019-06-09 ENCOUNTER — Telehealth (HOSPITAL_COMMUNITY): Payer: Self-pay | Admitting: *Deleted

## 2019-06-09 ENCOUNTER — Encounter (HOSPITAL_COMMUNITY): Payer: Self-pay

## 2019-06-09 NOTE — Patient Instructions (Signed)
Carolyn Joseph Chrystine Oiler  06/09/2019   Your procedure is scheduled on:  06/16/2019  Arrive at 1030 at Entrance C on [employer] at [facility]  and [employer]. You are invited to use the FREE valet parking or use the Visitor's parking deck.  Pick up the phone at the desk and dial [phone].  Call this number if you have problems the morning of surgery: [phone]  Remember:   Do not eat food:(After Midnight) Desps de medianoche.  Do not drink clear liquids: (After Midnight) Desps de medianoche.  Take these medicines the morning of surgery with A SIP OF WATER:  none   Do not wear jewelry, make-up or nail polish.  Do not wear lotions, powders, or perfumes. Do not wear deodorant.  Do not shave 48 hours prior to surgery.  Do not bring valuables to the hospital.  [facility] is not   responsible for any belongings or valuables brought to the hospital.  Contacts, dentures or bridgework may not be worn into surgery.  Leave suitcase in the car. After surgery it may be brought to your room.  For patients admitted to the hospital, checkout time is 11:00 AM the day of              discharge.      Please read over the following fact sheets that you were given:     Preparing for Surgery

## 2019-06-09 NOTE — Telephone Encounter (Signed)
Preadmission screen  

## 2019-06-15 ENCOUNTER — Other Ambulatory Visit (HOSPITAL_COMMUNITY)
Admission: RE | Admit: 2019-06-15 | Discharge: 2019-06-15 | Disposition: A | Discharge status: Home / Self Care | Payer: HRSA Program | Source: Ambulatory Visit | Attending: Family Medicine | Admitting: Family Medicine

## 2019-06-15 DIAGNOSIS — Z20822 Contact with and (suspected) exposure to covid-19: Secondary | ICD-10-CM | POA: Insufficient documentation

## 2019-06-15 DIAGNOSIS — Z01812 Encounter for preprocedural laboratory examination: Secondary | ICD-10-CM | POA: Diagnosis present

## 2019-06-15 LAB — SARS CORONAVIRUS 2 (TAT 6-24 HRS): SARS Coronavirus 2: NEGATIVE

## 2019-06-16 ENCOUNTER — Inpatient Hospital Stay (HOSPITAL_COMMUNITY)
Admission: RE | Admit: 2019-06-16 | Discharge: 2019-06-16 | DRG: 819 | Disposition: A | Discharge status: Home / Self Care | Payer: Self-pay | Attending: Obstetrics and Gynecology | Admitting: Obstetrics and Gynecology

## 2019-06-16 ENCOUNTER — Inpatient Hospital Stay (HOSPITAL_COMMUNITY): Payer: Self-pay

## 2019-06-16 ENCOUNTER — Encounter (HOSPITAL_COMMUNITY): Payer: Self-pay | Admitting: Obstetrics and Gynecology

## 2019-06-16 ENCOUNTER — Other Ambulatory Visit: Payer: Self-pay

## 2019-06-16 ENCOUNTER — Encounter (HOSPITAL_COMMUNITY)
Admission: RE | Disposition: A | Discharge status: Home / Self Care | Payer: Self-pay | Source: Home / Self Care | Attending: Obstetrics and Gynecology

## 2019-06-16 DIAGNOSIS — N883 Incompetence of cervix uteri: Secondary | ICD-10-CM | POA: Diagnosis present

## 2019-06-16 DIAGNOSIS — Q513 Bicornate uterus: Secondary | ICD-10-CM

## 2019-06-16 DIAGNOSIS — O3431 Maternal care for cervical incompetence, first trimester: Secondary | ICD-10-CM

## 2019-06-16 DIAGNOSIS — O3401 Maternal care for unspecified congenital malformation of uterus, first trimester: Principal | ICD-10-CM | POA: Diagnosis present

## 2019-06-16 DIAGNOSIS — Z3A13 13 weeks gestation of pregnancy: Secondary | ICD-10-CM

## 2019-06-16 DIAGNOSIS — O09299 Supervision of pregnancy with other poor reproductive or obstetric history, unspecified trimester: Secondary | ICD-10-CM

## 2019-06-16 HISTORY — PX: CERVICAL CERCLAGE: SHX1329

## 2019-06-16 LAB — CBC
HCT: 39.5 % (ref 36.0–46.0)
Hemoglobin: 13 g/dL (ref 12.0–15.0)
MCH: 28.6 pg (ref 26.0–34.0)
MCHC: 32.9 g/dL (ref 30.0–36.0)
MCV: 87 fL (ref 80.0–100.0)
Platelets: 199 10*3/uL (ref 150–400)
RBC: 4.54 MIL/uL (ref 3.87–5.11)
RDW: 13.5 % (ref 11.5–15.5)
WBC: 8.6 10*3/uL (ref 4.0–10.5)
nRBC: 0 % (ref 0.0–0.2)

## 2019-06-16 SURGERY — CERCLAGE, CERVIX, VAGINAL APPROACH
Anesthesia: Spinal | Site: Vagina | Wound class: Clean

## 2019-06-16 MED ORDER — OXYCODONE-ACETAMINOPHEN 5-325 MG PO TABS: 1.0000 | ORAL_TABLET | Freq: Four times a day (QID) | ORAL | 0 refills | Status: DC | PRN

## 2019-06-16 MED ORDER — FENTANYL CITRATE (PF) 100 MCG/2ML IJ SOLN
INTRAMUSCULAR | Status: AC
  Filled 2019-06-16: qty 2

## 2019-06-16 MED ORDER — PHENYLEPHRINE 40 MCG/ML (10ML) SYRINGE FOR IV PUSH (FOR BLOOD PRESSURE SUPPORT)
PREFILLED_SYRINGE | INTRAVENOUS | Status: AC
  Filled 2019-06-16: qty 20

## 2019-06-16 MED ORDER — FENTANYL CITRATE (PF) 100 MCG/2ML IJ SOLN: 25.0000 ug | INTRAMUSCULAR | Status: DC | PRN

## 2019-06-16 MED ORDER — CEFAZOLIN SODIUM-DEXTROSE 2-4 GM/100ML-% IV SOLN
INTRAVENOUS | Status: AC
  Filled 2019-06-16: qty 100

## 2019-06-16 MED ORDER — MEPERIDINE HCL 25 MG/ML IJ SOLN: 6.2500 mg | INTRAMUSCULAR | Status: DC | PRN

## 2019-06-16 MED ORDER — ACETAMINOPHEN 160 MG/5ML PO SOLN: 325.0000 mg | ORAL | Status: DC | PRN

## 2019-06-16 MED ORDER — LIDOCAINE HCL 1 % IJ SOLN
INTRAMUSCULAR | Status: AC
  Filled 2019-06-16: qty 20

## 2019-06-16 MED ORDER — ONDANSETRON HCL 4 MG/2ML IJ SOLN
INTRAMUSCULAR | Status: DC | PRN
  Administered 2019-06-16: 4 mg via INTRAVENOUS

## 2019-06-16 MED ORDER — CHLOROPROCAINE HCL (PF) 3 % IJ SOLN
INTRAMUSCULAR | Status: DC | PRN
  Administered 2019-06-16: 1.6 mL

## 2019-06-16 MED ORDER — PHENYLEPHRINE 40 MCG/ML (10ML) SYRINGE FOR IV PUSH (FOR BLOOD PRESSURE SUPPORT)
PREFILLED_SYRINGE | INTRAVENOUS | Status: AC
  Filled 2019-06-16: qty 10

## 2019-06-16 MED ORDER — CEFAZOLIN SODIUM-DEXTROSE 2-4 GM/100ML-% IV SOLN
2.0000 g | INTRAVENOUS | Status: AC
  Administered 2019-06-16: 2 g via INTRAVENOUS

## 2019-06-16 MED ORDER — OXYCODONE HCL 5 MG PO TABS: 5.0000 mg | ORAL_TABLET | Freq: Once | ORAL | Status: DC | PRN

## 2019-06-16 MED ORDER — ONDANSETRON HCL 4 MG/2ML IJ SOLN
INTRAMUSCULAR | Status: AC
  Filled 2019-06-16: qty 2

## 2019-06-16 MED ORDER — ACETAMINOPHEN 325 MG PO TABS: 325.0000 mg | ORAL_TABLET | ORAL | Status: DC | PRN

## 2019-06-16 MED ORDER — FENTANYL CITRATE (PF) 100 MCG/2ML IJ SOLN
INTRAMUSCULAR | Status: DC | PRN
  Administered 2019-06-16: 25 ug via INTRATHECAL

## 2019-06-16 MED ORDER — DEXAMETHASONE SODIUM PHOSPHATE 10 MG/ML IJ SOLN
INTRAMUSCULAR | Status: AC
  Filled 2019-06-16: qty 1

## 2019-06-16 MED ORDER — DEXAMETHASONE SODIUM PHOSPHATE 10 MG/ML IJ SOLN
INTRAMUSCULAR | Status: DC | PRN
  Administered 2019-06-16: 10 mg via INTRAVENOUS

## 2019-06-16 MED ORDER — OXYCODONE HCL 5 MG/5ML PO SOLN: 5.0000 mg | Freq: Once | ORAL | Status: DC | PRN

## 2019-06-16 MED ORDER — DOCUSATE SODIUM 100 MG PO CAPS: 100.0000 mg | ORAL_CAPSULE | Freq: Two times a day (BID) | ORAL | 2 refills | Status: DC | PRN

## 2019-06-16 MED ORDER — LACTATED RINGERS IV SOLN: INTRAVENOUS | Status: DC

## 2019-06-16 MED ORDER — ONDANSETRON HCL 4 MG/2ML IJ SOLN: 4.0000 mg | Freq: Once | INTRAMUSCULAR | Status: DC | PRN

## 2019-06-16 MED ORDER — SODIUM CHLORIDE 0.9 % IR SOLN
Status: DC | PRN
  Administered 2019-06-16: 1

## 2019-06-16 SURGICAL SUPPLY — 17 items
CANISTER SUCT 3000ML PPV (MISCELLANEOUS) ×6 IMPLANT
GLOVE BIOGEL PI IND STRL 6.5 (GLOVE) ×1 IMPLANT
GLOVE BIOGEL PI IND STRL 7.0 (GLOVE) ×1 IMPLANT
GLOVE BIOGEL PI INDICATOR 6.5 (GLOVE) ×2
GLOVE BIOGEL PI INDICATOR 7.0 (GLOVE) ×2
GLOVE SURG SS PI 6.5 STRL IVOR (GLOVE) ×3 IMPLANT
GOWN STRL REUS W/TWL LRG LVL3 (GOWN DISPOSABLE) ×6 IMPLANT
NS IRRIG 1000ML POUR BTL (IV SOLUTION) ×3 IMPLANT
PACK VAGINAL MINOR WOMEN LF (CUSTOM PROCEDURE TRAY) ×3 IMPLANT
PAD OB MATERNITY 4.3X12.25 (PERSONAL CARE ITEMS) ×3 IMPLANT
PAD PREP 24X48 CUFFED NSTRL (MISCELLANEOUS) ×3 IMPLANT
SUT PROLENE 0 CT 1 30 (SUTURE) ×3 IMPLANT
TOWEL OR 17X24 6PK STRL BLUE (TOWEL DISPOSABLE) ×6 IMPLANT
TRAY FOLEY W/BAG SLVR 14FR (SET/KITS/TRAYS/PACK) ×3 IMPLANT
TUBING NON-CON 1/4 X 20 CONN (TUBING) ×2 IMPLANT
TUBING NON-CON 1/4 X 20' CONN (TUBING) ×1
YANKAUER SUCT BULB TIP NO VENT (SUCTIONS) ×3 IMPLANT

## 2019-06-16 NOTE — H&P (Signed)
Carolyn Joseph Carolyn Joseph is a 29 y.o. female P1021 at 71w4 presenting for scheduled prophylactic cerclage placement. Patient with prenatal care at [facility] complicated by bicornuate uterus, history of incompetent cervix x 2 and previous cesarean section. Patient reports feeling well without complaints. She denies any cramping or vaginal bleeding.  OB History    Gravida  4   Para  1   Term  1   Preterm  0   AB  2   Living  1     SAB  2   TAB  0   Ectopic  0   Multiple  0   Live Births  1          Past Medical History:  Diagnosis Date  . Bicornate uterus   . Hip fracture (HCC) 2015  . Ovarian cyst    Past Surgical History:  Procedure Laterality Date  . CERVICAL CERCLAGE N/A 01/20/2018   Procedure: CERCLAGE CERVICAL;  Surgeon: Hermina Staggers, MD;  Location: [facility] BIRTHING SUITES;  Service: Gynecology;  Laterality: N/A;  . CESAREAN SECTION N/A 06/10/2018   Procedure: CESAREAN SECTION;  Surgeon: Adam Phenix, MD;  Location: [facility] LD ORS;  Service: Obstetrics;  Laterality: N/A;  . HIP FRACTURE SURGERY Bilateral 2015   Family History: family history includes Diabetes in her maternal grandmother and mother; Hypertension in her mother. Social History:  reports that she has quit smoking. Her smoking use included cigarettes. She has never used smokeless tobacco. She reports previous alcohol use. She reports that she does not use drugs.      Review of Systems History  See pertinent in HPI   Temperature 98.2 F (36.8 C), last menstrual period 03/06/2019, unknown if currently breastfeeding. Exam Physical Exam  GENERAL: Well-developed, well-nourished female in no acute distress.  LUNGS: Clear to auscultation bilaterally.  HEART: Regular rate and rhythm. ABDOMEN: Soft, nontender, nondistended. No organomegaly. PELVIC: Deferred to OR EXTREMITIES: No cyanosis, clubbing, or edema, 2+ distal pulses.  Prenatal labs: ABO, Rh: O/Positive/-- (03/01 1657) Antibody: Negative  (03/01 1657) Rubella: 2.66 (03/01 1657) RPR: Non Reactive (03/01 1657)  HBsAg: Negative (03/01 1657)  HIV: Non Reactive (03/01 1657)  GBS:     Assessment/Plan: 29 yo P1021 at [redacted]w[redacted]d here for scheduled cerclage placement - Risks, benefits and alternatives were explained including but not limited to risks of bleeidng, infection, rupture of membrane and damage to adjacent organs. Patient verbalized understanding and all questions were answered   Saide Lanuza 06/16/2019, 11:52 AM

## 2019-06-16 NOTE — Anesthesia Preprocedure Evaluation (Addendum)
Anesthesia Evaluation  Patient identified by MRN, date of birth, ID band Patient awake    Reviewed: Allergy & Precautions, NPO status , Patient's Chart, lab work & pertinent test results  Airway Mallampati: II  TM Distance: >3 FB Neck ROM: Full    Dental no notable dental hx. (+) Teeth Intact   Pulmonary former smoker,    Pulmonary exam normal breath sounds clear to auscultation       Cardiovascular Exercise Tolerance: Good negative cardio ROS Normal cardiovascular exam Rhythm:Regular Rate:Normal     Neuro/Psych negative neurological ROS     GI/Hepatic negative GI ROS,   Endo/Other  negative endocrine ROS  Renal/GU      Musculoskeletal   Abdominal   Peds  Hematology   Anesthesia Other Findings   Reproductive/Obstetrics (+) Pregnancy                            Anesthesia Physical  Anesthesia Plan  ASA: II  Anesthesia Plan: Spinal   Post-op Pain Management:    Induction:   PONV Risk Score and Plan: 3 and Treatment may vary due to age or medical condition, Ondansetron and Dexamethasone  Airway Management Planned: Nasal Cannula and Natural Airway  Additional Equipment:   Intra-op Plan:   Post-operative Plan:   Informed Consent: I have reviewed the patients History and Physical, chart, labs and discussed the procedure including the risks, benefits and alternatives for the proposed anesthesia with the patient or authorized representative who has indicated his/her understanding and acceptance.     Dental advisory given  Plan Discussed with: Anesthesiologist  Anesthesia Plan Comments:         Anesthesia Quick Evaluation

## 2019-06-16 NOTE — Anesthesia Procedure Notes (Addendum)
Spinal  Patient location during procedure: OR Start time: 06/16/2019 12:13 PM End time: 06/16/2019 12:20 PM Staffing Performed: other anesthesia staff  Anesthesiologist: Bethena Midget, MD Resident/CRNA: Orlie Pollen, CRNA Preanesthetic Checklist Completed: patient identified, IV checked, site marked, risks and benefits discussed, surgical consent, monitors and equipment checked, pre-op evaluation and timeout performed Spinal Block Patient position: sitting Prep: DuraPrep Patient monitoring: heart rate, cardiac monitor, blood pressure and continuous pulse ox Approach: midline Location: L3-4 Injection technique: single-shot Needle Needle type: Pencan  Needle gauge: 24 G Needle length: 10 cm Needle insertion depth: 8 cm Assessment Sensory level: T10 Additional Notes 1.33mL 3% Chloroprocaine with fentanyl. Performed by Heide Scales, SRNA.

## 2019-06-16 NOTE — Transfer of Care (Signed)
Immediate Anesthesia Transfer of Care Note  Patient: Carolyn Joseph  Procedure(s) Performed: CERCLAGE CERVICAL (N/A Vagina )  Patient Location: PACU  Anesthesia Type:Spinal  Level of Consciousness: awake, alert , oriented and patient cooperative  Airway & Oxygen Therapy: Patient Spontanous Breathing  Post-op Assessment: Report given to RN and Post -op Vital signs reviewed and stable  Post vital signs: Reviewed and stable  Last Vitals:  Vitals Value Taken Time  BP    Temp    Pulse    Resp    SpO2      Last Pain: There were no vitals filed for this visit.       Complications: No apparent anesthesia complications

## 2019-06-16 NOTE — Op Note (Signed)
Carolyn Joseph   PROCEDURE DATE: 06/16/2019  PREOPERATIVE DIAGNOSIS: Intrauterine pregnancy at [redacted]w[redacted]d, history of cervical incompetence   POSTOPERATIVE DIAGNOSIS: The same PROCEDURE: Transvaginal McDonald Cervical Cerclage Placement SURGEON:  Dr. Catalina Antigua  INDICATIONS: 29 y.o. [id number] at [redacted]w[redacted]d with history of cervical incompetence, here for cerclage placement.   The risks of surgery were discussed in detail with the patient including but not limited to: bleeding; infection which may require antibiotic therapy; injury to cervix, vagina other surrounding organs; risk of ruptured membranes and/or preterm delivery and other postoperative or anesthesia complications.  Written informed consent was obtained.    FINDINGS:  About 2 cm palpable cervical length in the vagina, closed cervix, suture knot placed anteriorly.  ANESTHESIA:  Spinal INTRAVENOUS FLUIDS: 900  ml ESTIMATED BLOOD LOSS: 20 ml COMPLICATIONS: None immediate  PROCEDURE IN DETAIL:  The patient received intravenous antibiotics and had sequential compression devices applied to her lower extremities while in the preoperative area.  Reassuring fetal heart rate was also obtained using a doppler. She was then taken to the operating room where spinal anesthesia was administered and was found to be adequate.  She was placed in the dorsal lithotomy, and was prepped and draped in a sterile manner. Her bladder was catheterized for an unmeasured amount of clear, yellow urine.  After an adequate timeout was performed, a vaginal speculum was then placed in the patient's vagina and a single tooth tenaculum was applied to the anterior lip of the cervix.  A paracervical block using 1% Marcaine was administered.  The anterior and posterior lips of the cervix was grasped with ring forceps. A curved needle loaded with a number 1 Prolene suture was inserted at 12 o'clock, as high as possible at the junction of the rugated vaginal epithelium and the  smooth cervix, at least 2 cm above the external os.  Four bites are taken circumferentially around the entire cervix in a purse-string fashion, each bite should be deep enough to extend at least midway into the cervical stroma, but not into the endocervical canal. The two ends of the suture were then tied securely anteriorly and cut, leaving the ends long enough to grasp with a clamp when it is time to remove it. There was minimal bleeding noted and the ring forceps were removed with good hemostasis noted.  All instruments were removed from the patient's vagina.  Instrument, needle and sponge counts were correct x 2. The patient tolerated the procedure well, and was taken to the recovery area awake and in stable condition. Reassuring fetal heart rate was also obtained using a doppler in the recovery area.  The patient will be discharged to home as per PACU criteria.  Routine postoperative instructions given.  She was prescribed Percocet and Colace.  She will follow up in the clinic on 06/22/19 for postoperative evaluation and ongoing prenatal care

## 2019-06-16 NOTE — Discharge Instructions (Signed)
Cervical Cerclage, Care After This sheet gives you information about how to care for yourself after your procedure. Your health care provider may also give you more specific instructions. If you have problems or questions, contact your health care provider. What can I expect after the procedure? After your procedure, it is common to have:  Cramping in your abdomen.  Mucus discharge from your vagina. This may last for several days.  Painful urination (dysuria).  Spotting, or small drops of blood coming from your vagina. Follow these instructions at home:  Medicines  Take over-the-counter and prescription medicines only as told by your health care provider.  Ask your health care provider if the medicine prescribed to you requires you to avoid driving or using heavy machinery. General instructions  If you are told to go on bed rest, follow instructions from your health care provider. You may need to be on bed rest for up to 3 days.  Keep track of your vaginal discharge and watch for any changes. If you notice changes, tell your health care provider.  Avoid physical activities and exercise until your health care provider approves. Ask your health care provider what activities are safe for you.  Do not douche or have sex until your health care provider says it is okay to do so.  Keep all follow-up visits, including prenatal visits, as told by your health care provider. This is important. ? Prenatal visits are all the care that you receive before the birth of your baby. ? You may also need an ultrasound.  You may be asked to have weekly visits to have your cervix checked. Contact a health care provider if you:  Have abnormal discharge from your vagina, such as clots.  Have a bad-smelling discharge from your vagina.  Develop a rash on your skin. This may look like redness and swelling.  Become light-headed or feel like you are going to faint.  Have abdominal pain that does not  get better with medicine.  Have nausea or vomiting that does not go away. Get help right away if you:  Have vaginal bleeding that is heavier or more frequent than spotting.  Are leaking fluid or your water breaks.  Have a fever or chills.  Faint.  Have uterine contractions. These may feel like: ? A back ache. ? Lower abdominal pain. ? Mild cramps, similar to menstrual cramps. ? Tightening or pressure in your abdomen.  Think that your baby is not moving as much as usual, or you cannot feel your baby move.  Have chest pain.  Have shortness of breath. Summary  After the procedure, it is common to have cramping, vaginal discharge, painful urination, and small drops of blood coming from your vagina.  If you are told to go on bed rest, follow instructions from your health care provider. You may need to be on bed rest for up to 3 days.  Keep track of your vaginal discharge and watch for any changes. If you notice changes, tell your health care provider.  Contact a health care provider if you have abnormal vaginal discharge, become light-headed, or have pain that cannot be controlled with medicines.  Get help right away if you have heavy vaginal bleeding, your water breaks, or you have uterine contractions. Also, get help right away if your baby is not moving as much as usual, or you have chest pain or shortness of breath. This information is not intended to replace advice given to you by your health care provider.   Make sure you discuss any questions you have with your health care provider. Document Revised: 01/06/2019 Document Reviewed: 11/05/2018 Elsevier Patient Education  2020 Elsevier Inc.  

## 2019-06-16 NOTE — Anesthesia Postprocedure Evaluation (Signed)
Anesthesia Post Note  Patient: Engineer, maintenance (IT)  Procedure(s) Performed: CERCLAGE CERVICAL (N/A Vagina )     Patient location during evaluation: PACU Anesthesia Type: Spinal Level of consciousness: oriented and awake and alert Pain management: pain level controlled Vital Signs Assessment: post-procedure vital signs reviewed and stable Respiratory status: spontaneous breathing, respiratory function stable and patient connected to nasal cannula oxygen Cardiovascular status: blood pressure returned to baseline and stable Postop Assessment: no headache, no backache and no apparent nausea or vomiting Anesthetic complications: no    Last Vitals:  Vitals:   06/16/19 1428 06/16/19 1430  BP:  117/70  Pulse:  96  Resp: (!) 23 20  Temp:  36.8 C  SpO2:      Last Pain:  Vitals:   06/16/19 1329  PainSc: 0-No pain   Pain Goal:                   Adajah Cocking

## 2019-06-17 ENCOUNTER — Encounter: Payer: Self-pay | Admitting: *Deleted

## 2019-06-22 ENCOUNTER — Encounter: Payer: Self-pay | Admitting: Family Medicine

## 2019-07-02 ENCOUNTER — Encounter: Payer: Self-pay | Admitting: Obstetrics and Gynecology

## 2019-07-02 ENCOUNTER — Other Ambulatory Visit: Payer: Self-pay

## 2019-07-02 ENCOUNTER — Other Ambulatory Visit (HOSPITAL_COMMUNITY)
Admission: RE | Admit: 2019-07-02 | Discharge: 2019-07-02 | Disposition: A | Discharge status: Home / Self Care | Payer: Self-pay | Source: Ambulatory Visit | Attending: Family Medicine | Admitting: Family Medicine

## 2019-07-02 ENCOUNTER — Ambulatory Visit (INDEPENDENT_AMBULATORY_CARE_PROVIDER_SITE_OTHER): Payer: Self-pay | Admitting: Obstetrics and Gynecology

## 2019-07-02 VITALS — BP 96/79 | HR 93 | Wt 161.9 lb

## 2019-07-02 DIAGNOSIS — O09292 Supervision of pregnancy with other poor reproductive or obstetric history, second trimester: Secondary | ICD-10-CM

## 2019-07-02 DIAGNOSIS — Z3A15 15 weeks gestation of pregnancy: Secondary | ICD-10-CM

## 2019-07-02 DIAGNOSIS — O0992 Supervision of high risk pregnancy, unspecified, second trimester: Secondary | ICD-10-CM

## 2019-07-02 DIAGNOSIS — O0990 Supervision of high risk pregnancy, unspecified, unspecified trimester: Secondary | ICD-10-CM

## 2019-07-02 DIAGNOSIS — O3402 Maternal care for unspecified congenital malformation of uterus, second trimester: Secondary | ICD-10-CM

## 2019-07-02 DIAGNOSIS — Z8759 Personal history of other complications of pregnancy, childbirth and the puerperium: Secondary | ICD-10-CM

## 2019-07-02 DIAGNOSIS — N898 Other specified noninflammatory disorders of vagina: Secondary | ICD-10-CM

## 2019-07-02 DIAGNOSIS — Z98891 History of uterine scar from previous surgery: Secondary | ICD-10-CM

## 2019-07-02 DIAGNOSIS — Q513 Bicornate uterus: Secondary | ICD-10-CM

## 2019-07-02 DIAGNOSIS — O09299 Supervision of pregnancy with other poor reproductive or obstetric history, unspecified trimester: Secondary | ICD-10-CM

## 2019-07-02 MED ORDER — PROGESTERONE MICRONIZED 200 MG PO CAPS: 200.0000 mg | ORAL_CAPSULE | Freq: Every day | ORAL | 5 refills | Status: DC

## 2019-07-02 NOTE — Patient Instructions (Signed)

## 2019-07-02 NOTE — Progress Notes (Signed)
Pt states is having vaginal itching & discomfort.

## 2019-07-02 NOTE — Progress Notes (Signed)
Subjective:  Carolyn Joseph is a 29 y.o. [id number] at [redacted]w[redacted]d being seen today for ongoing prenatal care.  She is currently monitored for the following issues for this high-risk pregnancy and has Bicornate uterus complicating pregnancy; Supervision of high risk pregnancy, antepartum; History of incompetent cervix, currently pregnant; History of prior pregnancy with IUGR newborn; Short interval between pregnancies affecting pregnancy, antepartum; and History of cesarean delivery on their problem list.  Patient reports some vaginal itching/discharge, occ lower abd cramp, no bleeding.  Contractions: Not present. Vag. Bleeding: None.  Movement: Present. Denies leaking of fluid.   The following portions of the patient's history were reviewed and updated as appropriate: allergies, current medications, past family history, past medical history, past social history, past surgical history and problem list. Problem list updated.  Objective:   Vitals:   07/02/19 1429  BP: 96/79  Pulse: 93  Weight: 161 lb 14.4 oz (73.4 kg)    Fetal Status: Fetal Heart Rate (bpm): 153   Movement: Present     General:  Alert, oriented and cooperative. Patient is in no acute distress.  Skin: Skin is warm and dry. No rash noted.   Cardiovascular: Normal heart rate noted  Respiratory: Normal respiratory effort, no problems with respiration noted  Abdomen: Soft, gravid, appropriate for gestational age. Pain/Pressure: Present     Pelvic:  Cervical exam performed        Extremities: Normal range of motion.  Edema: None  Mental Status: Normal mood and affect. Normal behavior. Normal judgment and thought content.   Urinalysis:      Assessment and Plan:  Pregnancy: G4P1021 at [redacted]w[redacted]d  1. Supervision of high risk pregnancy, antepartum Anatomy scan scheduled for end of the month - Genetic Screening  2. History of incompetent cervix, currently pregnant Will check CL next week Reminded about pelvic rest. Will start  vaginal progesterone. Pt used with last pregnancy/cerclage. - [place] MFM OB TRANSVAGINAL; Future - progesterone (PROMETRIUM) 200 MG capsule; Place 1 capsule (200 mg total) vaginally at bedtime.  Dispense: 30 capsule; Refill: 5 Vaginal swab collected today  3. History of cesarean delivery Discuss TOLAC vs RLTCS at later appts  4. 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 History of prior pregnancy with IUGR newborn U/S ordered  6. Discharge from the vagina  - Cervicovaginal ancillary only( [place])  Preterm labor symptoms and general obstetric precautions including but not limited to vaginal bleeding, contractions, leaking of fluid and fetal movement were reviewed in detail with the patient. Please refer to After Visit Summary for other counseling recommendations.  Return in about 4 weeks (around 07/30/2019) for OB visit, face to face, MD provider.   Hermina Staggers, MD

## 2019-07-02 NOTE — Addendum Note (Signed)
Addended by: Hermina Staggers on: 07/02/2019 02:53 PM   Modules accepted: Orders

## 2019-07-03 LAB — CERVICOVAGINAL ANCILLARY ONLY
Bacterial Vaginitis (gardnerella): NEGATIVE
Candida Glabrata: NEGATIVE
Candida Vaginitis: NEGATIVE
Comment: NEGATIVE
Comment: NEGATIVE
Comment: NEGATIVE

## 2019-07-04 LAB — AFP, SERUM, OPEN SPINA BIFIDA
AFP MoM: 1.03
AFP Value: 32.2 ng/mL
Gest. Age on Collection Date: 15.6 weeks
Maternal Age At EDD: 26.8 yr
OSBR Risk 1 IN: 10000
Test Results:: NEGATIVE
Weight: 161 [lb_av]

## 2019-07-08 ENCOUNTER — Encounter: Payer: Self-pay | Admitting: *Deleted

## 2019-07-10 ENCOUNTER — Other Ambulatory Visit: Payer: Self-pay

## 2019-07-10 ENCOUNTER — Ambulatory Visit (HOSPITAL_COMMUNITY)
Admission: RE | Admit: 2019-07-10 | Discharge: 2019-07-10 | Disposition: A | Discharge status: Home / Self Care | Payer: Self-pay | Source: Ambulatory Visit | Attending: Obstetrics and Gynecology | Admitting: Obstetrics and Gynecology

## 2019-07-10 DIAGNOSIS — Z3686 Encounter for antenatal screening for cervical length: Secondary | ICD-10-CM

## 2019-07-10 DIAGNOSIS — O3432 Maternal care for cervical incompetence, second trimester: Secondary | ICD-10-CM

## 2019-07-10 DIAGNOSIS — O09299 Supervision of pregnancy with other poor reproductive or obstetric history, unspecified trimester: Secondary | ICD-10-CM | POA: Insufficient documentation

## 2019-07-10 DIAGNOSIS — O34219 Maternal care for unspecified type scar from previous cesarean delivery: Secondary | ICD-10-CM

## 2019-07-10 DIAGNOSIS — Z3A17 17 weeks gestation of pregnancy: Secondary | ICD-10-CM

## 2019-07-10 DIAGNOSIS — O34592 Maternal care for other abnormalities of gravid uterus, second trimester: Secondary | ICD-10-CM

## 2019-07-10 DIAGNOSIS — Q513 Bicornate uterus: Secondary | ICD-10-CM

## 2019-07-20 ENCOUNTER — Ambulatory Visit (HOSPITAL_COMMUNITY): Payer: MEDICAID

## 2019-07-21 ENCOUNTER — Encounter: Payer: Self-pay | Admitting: *Deleted

## 2019-08-03 ENCOUNTER — Encounter: Payer: Self-pay | Admitting: Obstetrics and Gynecology

## 2019-08-06 ENCOUNTER — Ambulatory Visit: Payer: Self-pay | Attending: Family Medicine

## 2019-08-06 ENCOUNTER — Other Ambulatory Visit: Payer: Self-pay | Admitting: *Deleted

## 2019-08-06 ENCOUNTER — Other Ambulatory Visit: Payer: Self-pay

## 2019-08-06 DIAGNOSIS — O34219 Maternal care for unspecified type scar from previous cesarean delivery: Secondary | ICD-10-CM

## 2019-08-06 DIAGNOSIS — O09292 Supervision of pregnancy with other poor reproductive or obstetric history, second trimester: Secondary | ICD-10-CM

## 2019-08-06 DIAGNOSIS — Z8759 Personal history of other complications of pregnancy, childbirth and the puerperium: Secondary | ICD-10-CM | POA: Insufficient documentation

## 2019-08-06 DIAGNOSIS — Q513 Bicornate uterus: Secondary | ICD-10-CM | POA: Insufficient documentation

## 2019-08-06 DIAGNOSIS — O0990 Supervision of high risk pregnancy, unspecified, unspecified trimester: Secondary | ICD-10-CM | POA: Insufficient documentation

## 2019-08-06 DIAGNOSIS — O3400 Maternal care for unspecified congenital malformation of uterus, unspecified trimester: Secondary | ICD-10-CM

## 2019-08-06 DIAGNOSIS — O09299 Supervision of pregnancy with other poor reproductive or obstetric history, unspecified trimester: Secondary | ICD-10-CM | POA: Insufficient documentation

## 2019-08-06 DIAGNOSIS — O3432 Maternal care for cervical incompetence, second trimester: Secondary | ICD-10-CM

## 2019-08-06 DIAGNOSIS — O34592 Maternal care for other abnormalities of gravid uterus, second trimester: Secondary | ICD-10-CM

## 2019-08-06 DIAGNOSIS — O09892 Supervision of other high risk pregnancies, second trimester: Secondary | ICD-10-CM

## 2019-08-06 DIAGNOSIS — Z3A21 21 weeks gestation of pregnancy: Secondary | ICD-10-CM

## 2019-08-06 DIAGNOSIS — Z363 Encounter for antenatal screening for malformations: Secondary | ICD-10-CM

## 2019-08-10 ENCOUNTER — Telehealth: Payer: Self-pay | Admitting: Obstetrics and Gynecology

## 2019-08-10 ENCOUNTER — Encounter: Payer: Self-pay | Admitting: Obstetrics and Gynecology

## 2019-08-10 NOTE — Telephone Encounter (Signed)
Spoke with patient about her missed appointment. She stated she forgot about her appointment. I was able to get her scheduled for the next available appointment, which is 05/28.

## 2019-08-21 ENCOUNTER — Encounter: Payer: Self-pay | Admitting: Family Medicine

## 2019-08-21 NOTE — Progress Notes (Signed)
Patient did not keep appointment today. She will be called to reschedule.  

## 2019-09-04 ENCOUNTER — Other Ambulatory Visit: Payer: Self-pay

## 2019-09-04 ENCOUNTER — Other Ambulatory Visit: Payer: Self-pay | Admitting: Obstetrics

## 2019-09-04 ENCOUNTER — Other Ambulatory Visit: Payer: Self-pay | Admitting: *Deleted

## 2019-09-04 ENCOUNTER — Ambulatory Visit: Payer: Self-pay | Attending: Obstetrics and Gynecology

## 2019-09-04 ENCOUNTER — Ambulatory Visit: Payer: Self-pay | Admitting: *Deleted

## 2019-09-04 DIAGNOSIS — O34592 Maternal care for other abnormalities of gravid uterus, second trimester: Secondary | ICD-10-CM

## 2019-09-04 DIAGNOSIS — O3432 Maternal care for cervical incompetence, second trimester: Secondary | ICD-10-CM

## 2019-09-04 DIAGNOSIS — O09899 Supervision of other high risk pregnancies, unspecified trimester: Secondary | ICD-10-CM

## 2019-09-04 DIAGNOSIS — O09292 Supervision of pregnancy with other poor reproductive or obstetric history, second trimester: Secondary | ICD-10-CM

## 2019-09-04 DIAGNOSIS — Q513 Bicornate uterus: Secondary | ICD-10-CM | POA: Insufficient documentation

## 2019-09-04 DIAGNOSIS — O0990 Supervision of high risk pregnancy, unspecified, unspecified trimester: Secondary | ICD-10-CM

## 2019-09-04 DIAGNOSIS — Z3A25 25 weeks gestation of pregnancy: Secondary | ICD-10-CM

## 2019-09-04 DIAGNOSIS — O34219 Maternal care for unspecified type scar from previous cesarean delivery: Secondary | ICD-10-CM

## 2019-09-04 DIAGNOSIS — O321XX0 Maternal care for breech presentation, not applicable or unspecified: Secondary | ICD-10-CM

## 2019-09-04 DIAGNOSIS — O3400 Maternal care for unspecified congenital malformation of uterus, unspecified trimester: Secondary | ICD-10-CM | POA: Insufficient documentation

## 2019-09-04 DIAGNOSIS — O09892 Supervision of other high risk pregnancies, second trimester: Secondary | ICD-10-CM

## 2019-09-04 DIAGNOSIS — Z363 Encounter for antenatal screening for malformations: Secondary | ICD-10-CM

## 2019-09-04 DIAGNOSIS — O365920 Maternal care for other known or suspected poor fetal growth, second trimester, not applicable or unspecified: Secondary | ICD-10-CM

## 2019-09-04 DIAGNOSIS — O09299 Supervision of pregnancy with other poor reproductive or obstetric history, unspecified trimester: Secondary | ICD-10-CM

## 2019-09-04 DIAGNOSIS — Z8759 Personal history of other complications of pregnancy, childbirth and the puerperium: Secondary | ICD-10-CM | POA: Insufficient documentation

## 2019-09-04 DIAGNOSIS — O402XX0 Polyhydramnios, second trimester, not applicable or unspecified: Secondary | ICD-10-CM

## 2019-09-04 DIAGNOSIS — O365930 Maternal care for other known or suspected poor fetal growth, third trimester, not applicable or unspecified: Secondary | ICD-10-CM

## 2019-10-02 ENCOUNTER — Ambulatory Visit: Payer: Self-pay | Attending: Maternal & Fetal Medicine

## 2019-10-02 ENCOUNTER — Ambulatory Visit: Payer: Self-pay

## 2019-10-19 ENCOUNTER — Ambulatory Visit: Payer: Self-pay | Attending: Maternal & Fetal Medicine

## 2019-10-19 ENCOUNTER — Ambulatory Visit: Payer: Self-pay | Admitting: *Deleted

## 2019-10-19 ENCOUNTER — Other Ambulatory Visit: Payer: Self-pay

## 2019-10-19 ENCOUNTER — Ambulatory Visit (INDEPENDENT_AMBULATORY_CARE_PROVIDER_SITE_OTHER): Payer: Self-pay | Admitting: Obstetrics and Gynecology

## 2019-10-19 ENCOUNTER — Other Ambulatory Visit: Payer: Self-pay | Admitting: *Deleted

## 2019-10-19 VITALS — BP 122/82 | HR 101 | Wt 181.8 lb

## 2019-10-19 DIAGNOSIS — Z363 Encounter for antenatal screening for malformations: Secondary | ICD-10-CM

## 2019-10-19 DIAGNOSIS — O34219 Maternal care for unspecified type scar from previous cesarean delivery: Secondary | ICD-10-CM

## 2019-10-19 DIAGNOSIS — O0990 Supervision of high risk pregnancy, unspecified, unspecified trimester: Secondary | ICD-10-CM

## 2019-10-19 DIAGNOSIS — O365930 Maternal care for other known or suspected poor fetal growth, third trimester, not applicable or unspecified: Secondary | ICD-10-CM

## 2019-10-19 DIAGNOSIS — Z8759 Personal history of other complications of pregnancy, childbirth and the puerperium: Secondary | ICD-10-CM

## 2019-10-19 DIAGNOSIS — O09299 Supervision of pregnancy with other poor reproductive or obstetric history, unspecified trimester: Secondary | ICD-10-CM

## 2019-10-19 DIAGNOSIS — Z98891 History of uterine scar from previous surgery: Secondary | ICD-10-CM

## 2019-10-19 DIAGNOSIS — O3402 Maternal care for unspecified congenital malformation of uterus, second trimester: Secondary | ICD-10-CM

## 2019-10-19 DIAGNOSIS — Z3A31 31 weeks gestation of pregnancy: Secondary | ICD-10-CM

## 2019-10-19 DIAGNOSIS — O09893 Supervision of other high risk pregnancies, third trimester: Secondary | ICD-10-CM

## 2019-10-19 DIAGNOSIS — O3433 Maternal care for cervical incompetence, third trimester: Secondary | ICD-10-CM

## 2019-10-19 DIAGNOSIS — Q513 Bicornate uterus: Secondary | ICD-10-CM

## 2019-10-19 DIAGNOSIS — O09293 Supervision of pregnancy with other poor reproductive or obstetric history, third trimester: Secondary | ICD-10-CM

## 2019-10-19 DIAGNOSIS — O09899 Supervision of other high risk pregnancies, unspecified trimester: Secondary | ICD-10-CM

## 2019-10-19 DIAGNOSIS — O0993 Supervision of high risk pregnancy, unspecified, third trimester: Secondary | ICD-10-CM

## 2019-10-19 DIAGNOSIS — O34593 Maternal care for other abnormalities of gravid uterus, third trimester: Secondary | ICD-10-CM

## 2019-10-19 NOTE — Progress Notes (Signed)
   PRENATAL VISIT NOTE  Subjective:  Carolyn Joseph is a 29 y.o. A1P3790 at [redacted]w[redacted]d being seen today for ongoing prenatal care.  She is currently monitored for the following issues for this high-risk pregnancy and has Bicornate uterus complicating pregnancy; Supervision of high risk pregnancy, antepartum; History of incompetent cervix, currently pregnant; History of prior pregnancy with IUGR newborn; Short interval between pregnancies affecting pregnancy, antepartum; and History of cesarean delivery on their problem list.  Patient reports no complaints.  Contractions: Not present. Vag. Bleeding: None.  Movement: Present. Denies leaking of fluid.  She has missed several appointments.  The patient has not done her 2 hr GTT. The following portions of the patient's history were reviewed and updated as appropriate: allergies, current medications, past family history, past medical history, past social history, past surgical history and problem list.   Objective:   Vitals:   10/19/19 1323  BP: 122/82  Pulse: 101  Weight: 181 lb 12.8 oz (82.5 kg)    Fetal Status: Fetal Heart Rate (bpm): 151 Fundal Height: 32 cm Movement: Present     General:  Alert, oriented and cooperative. Patient is in no acute distress.  Skin: Skin is warm and dry. No rash noted.   Cardiovascular: Normal heart rate noted  Respiratory: Normal respiratory effort, no problems with respiration noted  Abdomen: Soft, gravid, appropriate for gestational age.  Pain/Pressure: Present     Pelvic: Cervical exam deferred        Extremities: Normal range of motion.  Edema: Trace  Mental Status: Normal mood and affect. Normal behavior. Normal judgment and thought content.   Assessment and Plan:  Pregnancy: G4P1021 at [redacted]w[redacted]d 1. Supervision of high risk pregnancy, antepartum - Patient for 2 hr gtt and 28 week labs this week.  2. History of incompetent cervix, currently pregnant - Stable  3. History of cesarean delivery -  TOLAC vs repeat c/section to be discussed at next appointment.  4. 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  5. History of prior pregnancy with IUGR newborn - Follow up anatomy and growth scan done today and noted to be within normal limits.  Patient for repeat growth scan in 1 month.  Preterm labor symptoms and general obstetric precautions including but not limited to vaginal bleeding, contractions, leaking of fluid and fetal movement were reviewed in detail with the patient. Please refer to After Visit Summary for other counseling recommendations.   Return in about 2 weeks (around 11/02/2019) for [facility].  Future Appointments  Date Time Provider Department Center  11/16/2019  3:15 PM [facility]    Johnny Bridge, MD

## 2019-10-19 NOTE — Patient Instructions (Signed)
Preventing Preterm Birth Preterm birth is when your baby is delivered between 20 weeks and 37 weeks of pregnancy. A full-term pregnancy lasts for at least 37 weeks. Preterm birth can be dangerous for your baby because the last few weeks of pregnancy are an important time for your baby's brain and lungs to grow. Many things can cause a baby to be born early. Sometimes the cause is not known. There are certain factors that make you more likely to experience preterm birth, such as:  Having a previous baby born preterm.  Being pregnant with twins or other multiples.  Having had fertility treatment.  Being overweight or underweight at the start of your pregnancy.  Having any of the following during pregnancy: ? An infection, including a urinary tract infection (UTI) or an STI (sexually transmitted infection). ? High blood pressure. ? Diabetes. ? Vaginal bleeding.  Being age 35 or older.  Being age 18 or younger.  Getting pregnant within 6 months of a previous pregnancy.  Suffering extreme stress or physical or emotional abuse during pregnancy.  Standing for long periods of time during pregnancy, such as working at a job that requires standing. What are the risks? The most serious risk of preterm birth is that the baby may not survive. This is more likely to happen if a baby is born before 34 weeks. Other risks and complications of preterm birth may include your baby having:  Breathing problems.  Brain damage that affects movement and coordination (cerebral palsy).  Feeding difficulties.  Vision or hearing problems.  Infections or inflammation of the digestive tract (colitis).  Developmental delays.  Learning disabilities.  Higher risk for diabetes, heart disease, and high blood pressure later in life. What can I do to lower my risk?  Medical care The most important thing you can do to lower your risk for preterm birth is to get routine medical care during pregnancy (prenatal  care). If you have a high risk of preterm birth, you may be referred to a health care provider who specializes in managing high-risk pregnancies (perinatologist). You may be given medicine to help prevent preterm birth. Lifestyle changes Certain lifestyle changes can also lower your risk of preterm birth:  Wait at least 6 months after a pregnancy to become pregnant again.  Try to plan pregnancy for when you are between 19 and 35 years old.  Get to a healthy weight before getting pregnant. If you are overweight, work with your health care provider to safely lose weight.  Do not use any products that contain nicotine or tobacco, such as cigarettes and e-cigarettes. If you need help quitting, ask your health care provider.  Do not drink alcohol.  Do not use drugs. Where to find support For more support, consider:  Talking with your health care provider.  Talking with a therapist or substance abuse counselor, if you need help quitting.  Working with a diet and nutrition specialist (dietitian) or a personal trainer to maintain a healthy weight.  Joining a support group. Where to find more information Learn more about preventing preterm birth from:  Centers for Disease Control and Prevention: cdc.gov/reproductivehealth/maternalinfanthealth/pretermbirth.htm  March of Dimes: marchofdimes.org/complications/premature-babies.aspx  American Pregnancy Association: americanpregnancy.org/labor-and-birth/premature-labor Contact a health care provider if:  You have any of the following signs of preterm labor before 37 weeks: ? A change or increase in vaginal discharge. ? Fluid leaking from your vagina. ? Pressure or cramps in your lower abdomen. ? A backache that does not go away or gets worse. ?   Regular tightening (contractions) in your lower abdomen. Summary  Preterm birth means having your baby during weeks 20-37 of pregnancy.  Preterm birth may put your baby at risk for physical and  mental problems.  Getting good prenatal care can help prevent preterm birth.  You can lower your risk of preterm birth by making certain lifestyle changes, such as not smoking and not using alcohol. This information is not intended to replace advice given to you by your health care provider. Make sure you discuss any questions you have with your health care provider. Document Revised: 02/22/2017 Document Reviewed: 11/19/2015 Elsevier Patient Education  2020 Elsevier Inc.  

## 2019-10-21 ENCOUNTER — Other Ambulatory Visit: Payer: Self-pay

## 2019-10-21 ENCOUNTER — Other Ambulatory Visit: Payer: Self-pay | Admitting: General Practice

## 2019-10-21 DIAGNOSIS — O0990 Supervision of high risk pregnancy, unspecified, unspecified trimester: Secondary | ICD-10-CM

## 2019-10-22 LAB — CBC
Hematocrit: 35.4 % (ref 34.0–46.6)
Hemoglobin: 11.7 g/dL (ref 11.1–15.9)
MCH: 28.7 pg (ref 26.6–33.0)
MCHC: 33.1 g/dL (ref 31.5–35.7)
MCV: 87 fL (ref 79–97)
Platelets: 206 10*3/uL (ref 150–450)
RBC: 4.07 x10E6/uL (ref 3.77–5.28)
RDW: 14.1 % (ref 11.7–15.4)
WBC: 8.9 10*3/uL (ref 3.4–10.8)

## 2019-10-22 LAB — HIV ANTIBODY (ROUTINE TESTING W REFLEX): HIV Screen 4th Generation wRfx: NONREACTIVE

## 2019-10-22 LAB — RPR: RPR Ser Ql: NONREACTIVE

## 2019-10-22 LAB — GLUCOSE TOLERANCE, 2 HOURS W/ 1HR
Glucose, 1 hour: 147 mg/dL (ref 65–179)
Glucose, 2 hour: 99 mg/dL (ref 65–152)
Glucose, Fasting: 70 mg/dL (ref 65–91)

## 2019-11-02 ENCOUNTER — Encounter: Payer: Self-pay | Admitting: Obstetrics and Gynecology

## 2019-11-03 ENCOUNTER — Encounter: Payer: Self-pay | Admitting: Obstetrics and Gynecology

## 2019-11-16 ENCOUNTER — Ambulatory Visit: Payer: Self-pay

## 2019-11-18 ENCOUNTER — Encounter: Payer: Self-pay | Admitting: Obstetrics and Gynecology

## 2019-11-18 ENCOUNTER — Ambulatory Visit: Payer: Self-pay | Admitting: *Deleted

## 2019-11-18 ENCOUNTER — Ambulatory Visit: Payer: Self-pay | Attending: Obstetrics and Gynecology | Admitting: *Deleted

## 2019-11-18 ENCOUNTER — Ambulatory Visit (HOSPITAL_BASED_OUTPATIENT_CLINIC_OR_DEPARTMENT_OTHER): Payer: Self-pay

## 2019-11-18 ENCOUNTER — Other Ambulatory Visit: Payer: Self-pay

## 2019-11-18 VITALS — BP 123/86 | HR 86 | Ht 66.0 in | Wt 184.9 lb

## 2019-11-18 DIAGNOSIS — O34219 Maternal care for unspecified type scar from previous cesarean delivery: Secondary | ICD-10-CM

## 2019-11-18 DIAGNOSIS — O3403 Maternal care for unspecified congenital malformation of uterus, third trimester: Secondary | ICD-10-CM | POA: Insufficient documentation

## 2019-11-18 DIAGNOSIS — O0990 Supervision of high risk pregnancy, unspecified, unspecified trimester: Secondary | ICD-10-CM

## 2019-11-18 DIAGNOSIS — Q513 Bicornate uterus: Secondary | ICD-10-CM | POA: Insufficient documentation

## 2019-11-18 DIAGNOSIS — O365930 Maternal care for other known or suspected poor fetal growth, third trimester, not applicable or unspecified: Secondary | ICD-10-CM

## 2019-11-18 DIAGNOSIS — O3433 Maternal care for cervical incompetence, third trimester: Secondary | ICD-10-CM | POA: Insufficient documentation

## 2019-11-18 DIAGNOSIS — O09299 Supervision of pregnancy with other poor reproductive or obstetric history, unspecified trimester: Secondary | ICD-10-CM

## 2019-11-18 DIAGNOSIS — O09899 Supervision of other high risk pregnancies, unspecified trimester: Secondary | ICD-10-CM

## 2019-11-18 DIAGNOSIS — O34593 Maternal care for other abnormalities of gravid uterus, third trimester: Secondary | ICD-10-CM

## 2019-11-18 DIAGNOSIS — O09293 Supervision of pregnancy with other poor reproductive or obstetric history, third trimester: Secondary | ICD-10-CM | POA: Insufficient documentation

## 2019-11-18 DIAGNOSIS — Z8759 Personal history of other complications of pregnancy, childbirth and the puerperium: Secondary | ICD-10-CM | POA: Insufficient documentation

## 2019-11-18 DIAGNOSIS — O09893 Supervision of other high risk pregnancies, third trimester: Secondary | ICD-10-CM

## 2019-11-18 DIAGNOSIS — Z3A35 35 weeks gestation of pregnancy: Secondary | ICD-10-CM | POA: Insufficient documentation

## 2019-11-18 DIAGNOSIS — O321XX0 Maternal care for breech presentation, not applicable or unspecified: Secondary | ICD-10-CM | POA: Insufficient documentation

## 2019-11-18 NOTE — Progress Notes (Signed)
Patient seen and assessed by nursing staff.  Agree with documentation and plan.  

## 2019-11-18 NOTE — Progress Notes (Signed)
Here today had [place] in MFM. Came to desk wanted to speak to a nurse. States having a lot of uc's last night every 10-15 minutes around 8pm-1am. States went to bed around 1am. States when woke up this am occasional contractions. States feels baby moving good. FHR 136. Denies any leakage of fluid or bleeding. States she hasn't been seen in a while and wants to know where to go or call if she has contractions again. Per chart last ob fu was 10/19/19 and [facility] 11/03/19. No other fu scheduled. She reports she still has cerclage. I informed her I will have registar schedule her for ob fu this week if possible. I advised her if she has uc's  4 - 5 in an hour, decreased movement, etc to go to [facility] hospital. Map given.  She voices understanding. Niquan Charnley,RN

## 2019-11-18 NOTE — Addendum Note (Signed)
Addended by: Gerome Apley on: 11/18/2019 12:11 PM   Modules accepted: Level of Service

## 2019-11-26 ENCOUNTER — Encounter: Payer: Self-pay | Admitting: Obstetrics and Gynecology

## 2019-12-03 ENCOUNTER — Other Ambulatory Visit: Payer: Self-pay

## 2019-12-03 ENCOUNTER — Encounter: Payer: Self-pay | Admitting: General Practice

## 2019-12-03 ENCOUNTER — Encounter: Payer: Self-pay | Admitting: Obstetrics & Gynecology

## 2019-12-03 ENCOUNTER — Ambulatory Visit (INDEPENDENT_AMBULATORY_CARE_PROVIDER_SITE_OTHER): Payer: Self-pay | Admitting: Obstetrics & Gynecology

## 2019-12-03 ENCOUNTER — Other Ambulatory Visit (HOSPITAL_COMMUNITY)
Admission: RE | Admit: 2019-12-03 | Discharge: 2019-12-03 | Disposition: A | Discharge status: Home / Self Care | Payer: Self-pay | Source: Ambulatory Visit | Attending: Obstetrics & Gynecology | Admitting: Obstetrics & Gynecology

## 2019-12-03 VITALS — BP 131/76 | HR 93 | Wt 187.0 lb

## 2019-12-03 DIAGNOSIS — O3403 Maternal care for unspecified congenital malformation of uterus, third trimester: Secondary | ICD-10-CM

## 2019-12-03 DIAGNOSIS — Z8759 Personal history of other complications of pregnancy, childbirth and the puerperium: Secondary | ICD-10-CM

## 2019-12-03 DIAGNOSIS — Z3A00 Weeks of gestation of pregnancy not specified: Secondary | ICD-10-CM | POA: Insufficient documentation

## 2019-12-03 DIAGNOSIS — O09293 Supervision of pregnancy with other poor reproductive or obstetric history, third trimester: Secondary | ICD-10-CM

## 2019-12-03 DIAGNOSIS — O0993 Supervision of high risk pregnancy, unspecified, third trimester: Secondary | ICD-10-CM

## 2019-12-03 DIAGNOSIS — O0990 Supervision of high risk pregnancy, unspecified, unspecified trimester: Secondary | ICD-10-CM | POA: Insufficient documentation

## 2019-12-03 DIAGNOSIS — Z3A37 37 weeks gestation of pregnancy: Secondary | ICD-10-CM

## 2019-12-03 DIAGNOSIS — O3402 Maternal care for unspecified congenital malformation of uterus, second trimester: Secondary | ICD-10-CM

## 2019-12-03 DIAGNOSIS — Z98891 History of uterine scar from previous surgery: Secondary | ICD-10-CM

## 2019-12-03 DIAGNOSIS — Q513 Bicornate uterus: Secondary | ICD-10-CM

## 2019-12-03 DIAGNOSIS — O3433 Maternal care for cervical incompetence, third trimester: Secondary | ICD-10-CM

## 2019-12-03 DIAGNOSIS — O09299 Supervision of pregnancy with other poor reproductive or obstetric history, unspecified trimester: Secondary | ICD-10-CM

## 2019-12-03 NOTE — Progress Notes (Signed)
   PRENATAL VISIT NOTE  Subjective:  Carolyn Joseph is a 29 y.o. [id number] at [redacted]w[redacted]d being seen today for ongoing prenatal care.  She is currently monitored for the following issues for this high-risk pregnancy and has Bicornate uterus complicating pregnancy; Supervision of high risk pregnancy, antepartum; History of incompetent cervix, currently pregnant; History of prior pregnancy with IUGR newborn; Short interval between pregnancies affecting pregnancy, antepartum; and History of cesarean delivery on their problem list.  Patient reports no complaints.  Contractions: Irritability. Vag. Bleeding: None.  Movement: Present. Denies leaking of fluid.   The following portions of the patient's history were reviewed and updated as appropriate: allergies, current medications, past family history, past medical history, past social history, past surgical history and problem list.   Objective:   Vitals:   12/03/19 1010  BP: 131/76  Pulse: 93  Weight: 187 lb (84.8 kg)    Fetal Status: Fetal Heart Rate (bpm): 141   Movement: Present     General:  Alert, oriented and cooperative. Patient is in no acute distress.  Skin: Skin is warm and dry. No rash noted.   Cardiovascular: Normal heart rate noted  Respiratory: Normal respiratory effort, no problems with respiration noted  Abdomen: Soft, gravid, appropriate for gestational age.  Pain/Pressure: Present     Pelvic: Cervical exam deferred      Cerclage w/ prolene w/ knot at 12 o clock noted, Grasped with forceps and cut under false knot. Removed in one piece from the cervix.   Extremities: Normal range of motion.  Edema: Trace  Mental Status: Normal mood and affect. Normal behavior. Normal judgment and thought content.   Assessment and Plan:  Pregnancy: G4P1021 at [redacted]w[redacted]d 1. Supervision of high risk pregnancy, antepartum Pt doing well, cultures today.   2. Uterus bicornis affecting pregnancy in third trimester Prior C/S for breech, breech  presentation earlier this pregnancy   3. History of prior pregnancy with IUGR newborn  4. History of cesarean delivery Prior CDx1, if pt breech on U/S will need repeat at 39wks  5. History of incompetent cervix, currently pregnant Cerclage removed without difficulty.   Term labor symptoms and general obstetric precautions including but not limited to vaginal bleeding, contractions, leaking of fluid and fetal movement were reviewed in detail with the patient. Please refer to After Visit Summary for other counseling recommendations.   No follow-ups on file.  Future Appointments  Date Time Provider Department Center  12/10/2019  2:15 PM Warden Fillers, MD [facility]  12/17/2019  2:15 PM Warden Fillers, MD [facility]    Malachy Chamber, MD

## 2019-12-04 LAB — GC/CHLAMYDIA PROBE AMP (~~LOC~~) NOT AT ARMC
Chlamydia: NEGATIVE
Comment: NEGATIVE
Comment: NORMAL
Neisseria Gonorrhea: NEGATIVE

## 2019-12-06 LAB — CULTURE, BETA STREP (GROUP B ONLY): Strep Gp B Culture: NEGATIVE

## 2019-12-07 ENCOUNTER — Telehealth: Payer: Self-pay | Admitting: General Practice

## 2019-12-07 NOTE — Telephone Encounter (Signed)
Patient called into front office reporting contractions since last night. Patient states if she lays down the contractions get worse but if she sits up or walks around it feels better. Patient states she doesn't want to go into the hospital unless she is truly in labor. Discussed with patient walking around over the next little while & drinking lots of water to see if contractions improve/go away or continue. Discussed she may just be having false labor contractions since they are improving with movement. Told patient if contractions are continuing/worsening and aren't improving with movement/hydration to give [place] a call back as we could bring her in for cervical check. Patient verbalized understanding.

## 2019-12-08 ENCOUNTER — Ambulatory Visit: Payer: Self-pay | Attending: Obstetrics & Gynecology

## 2019-12-08 ENCOUNTER — Encounter (HOSPITAL_COMMUNITY)
Admission: AD | Disposition: A | Discharge status: Home / Self Care | Payer: Self-pay | Source: Home / Self Care | Attending: Obstetrics & Gynecology

## 2019-12-08 ENCOUNTER — Encounter (HOSPITAL_COMMUNITY): Payer: Self-pay | Admitting: Obstetrics & Gynecology

## 2019-12-08 ENCOUNTER — Other Ambulatory Visit: Payer: Self-pay

## 2019-12-08 ENCOUNTER — Inpatient Hospital Stay (HOSPITAL_COMMUNITY): Payer: Medicaid Other | Admitting: Anesthesiology

## 2019-12-08 ENCOUNTER — Inpatient Hospital Stay (HOSPITAL_COMMUNITY)
Admission: AD | Admit: 2019-12-08 | Discharge: 2019-12-11 | DRG: 787 | Disposition: A | Discharge status: Home / Self Care | Payer: Medicaid Other | Attending: Obstetrics & Gynecology | Admitting: Obstetrics & Gynecology

## 2019-12-08 DIAGNOSIS — O321XX0 Maternal care for breech presentation, not applicable or unspecified: Principal | ICD-10-CM | POA: Diagnosis present

## 2019-12-08 DIAGNOSIS — D62 Acute posthemorrhagic anemia: Secondary | ICD-10-CM | POA: Diagnosis present

## 2019-12-08 DIAGNOSIS — O09299 Supervision of pregnancy with other poor reproductive or obstetric history, unspecified trimester: Secondary | ICD-10-CM

## 2019-12-08 DIAGNOSIS — O34211 Maternal care for low transverse scar from previous cesarean delivery: Secondary | ICD-10-CM | POA: Diagnosis present

## 2019-12-08 DIAGNOSIS — O3403 Maternal care for unspecified congenital malformation of uterus, third trimester: Secondary | ICD-10-CM | POA: Diagnosis present

## 2019-12-08 DIAGNOSIS — O0990 Supervision of high risk pregnancy, unspecified, unspecified trimester: Secondary | ICD-10-CM

## 2019-12-08 DIAGNOSIS — Z87891 Personal history of nicotine dependence: Secondary | ICD-10-CM

## 2019-12-08 DIAGNOSIS — Z20822 Contact with and (suspected) exposure to covid-19: Secondary | ICD-10-CM | POA: Diagnosis present

## 2019-12-08 DIAGNOSIS — O99824 Streptococcus B carrier state complicating childbirth: Secondary | ICD-10-CM | POA: Diagnosis present

## 2019-12-08 DIAGNOSIS — Z3A38 38 weeks gestation of pregnancy: Secondary | ICD-10-CM

## 2019-12-08 DIAGNOSIS — Z98891 History of uterine scar from previous surgery: Secondary | ICD-10-CM

## 2019-12-08 DIAGNOSIS — Z8759 Personal history of other complications of pregnancy, childbirth and the puerperium: Secondary | ICD-10-CM

## 2019-12-08 DIAGNOSIS — O3400 Maternal care for unspecified congenital malformation of uterus, unspecified trimester: Secondary | ICD-10-CM

## 2019-12-08 DIAGNOSIS — Q513 Bicornate uterus: Secondary | ICD-10-CM | POA: Diagnosis not present

## 2019-12-08 DIAGNOSIS — O9081 Anemia of the puerperium: Secondary | ICD-10-CM | POA: Diagnosis present

## 2019-12-08 DIAGNOSIS — O26893 Other specified pregnancy related conditions, third trimester: Secondary | ICD-10-CM | POA: Diagnosis present

## 2019-12-08 DIAGNOSIS — O99893 Other specified diseases and conditions complicating puerperium: Secondary | ICD-10-CM | POA: Diagnosis not present

## 2019-12-08 DIAGNOSIS — O09899 Supervision of other high risk pregnancies, unspecified trimester: Secondary | ICD-10-CM

## 2019-12-08 LAB — TYPE AND SCREEN
ABO/RH(D): O POS
Antibody Screen: NEGATIVE

## 2019-12-08 LAB — CBC
HCT: 37.2 % (ref 36.0–46.0)
Hemoglobin: 11.9 g/dL — ABNORMAL LOW (ref 12.0–15.0)
MCH: 28.4 pg (ref 26.0–34.0)
MCHC: 32 g/dL (ref 30.0–36.0)
MCV: 88.8 fL (ref 80.0–100.0)
Platelets: 182 10*3/uL (ref 150–400)
RBC: 4.19 MIL/uL (ref 3.87–5.11)
RDW: 15.4 % (ref 11.5–15.5)
WBC: 9.9 10*3/uL (ref 4.0–10.5)
nRBC: 0 % (ref 0.0–0.2)

## 2019-12-08 LAB — URINALYSIS, ROUTINE W REFLEX MICROSCOPIC
Bilirubin Urine: NEGATIVE
Glucose, UA: NEGATIVE mg/dL
Ketones, ur: NEGATIVE mg/dL
Nitrite: NEGATIVE
Protein, ur: 30 mg/dL — AB
Specific Gravity, Urine: 1.013 (ref 1.005–1.030)
pH: 6 (ref 5.0–8.0)

## 2019-12-08 LAB — SARS CORONAVIRUS 2 BY RT PCR (HOSPITAL ORDER, PERFORMED IN ~~LOC~~ HOSPITAL LAB): SARS Coronavirus 2: NEGATIVE

## 2019-12-08 SURGERY — Surgical Case
Anesthesia: Spinal | Site: Abdomen | Wound class: Clean Contaminated

## 2019-12-08 MED ORDER — SIMETHICONE 80 MG PO CHEW: 80.0000 mg | CHEWABLE_TABLET | ORAL | Status: DC | PRN

## 2019-12-08 MED ORDER — ACETAMINOPHEN 325 MG PO TABS
650.0000 mg | ORAL_TABLET | Freq: Four times a day (QID) | ORAL | Status: DC
  Administered 2019-12-08 – 2019-12-11 (×9): 650 mg via ORAL
  Filled 2019-12-08 (×10): qty 2

## 2019-12-08 MED ORDER — ENOXAPARIN SODIUM 40 MG/0.4ML ~~LOC~~ SOLN
40.0000 mg | SUBCUTANEOUS | Status: DC
  Administered 2019-12-09: 40 mg via SUBCUTANEOUS
  Filled 2019-12-08 (×2): qty 0.4

## 2019-12-08 MED ORDER — MENTHOL 3 MG MT LOZG: 1.0000 | LOZENGE | OROMUCOSAL | Status: DC | PRN

## 2019-12-08 MED ORDER — CEFAZOLIN SODIUM-DEXTROSE 2-4 GM/100ML-% IV SOLN
2.0000 g | INTRAVENOUS | Status: AC
  Administered 2019-12-08: 2 g via INTRAVENOUS
  Filled 2019-12-08: qty 100

## 2019-12-08 MED ORDER — ACETAMINOPHEN 500 MG PO TABS: 1000.0000 mg | ORAL_TABLET | Freq: Four times a day (QID) | ORAL | Status: DC

## 2019-12-08 MED ORDER — DIPHENHYDRAMINE HCL 25 MG PO CAPS: 25.0000 mg | ORAL_CAPSULE | ORAL | Status: DC | PRN

## 2019-12-08 MED ORDER — MEPERIDINE HCL 25 MG/ML IJ SOLN: 6.2500 mg | INTRAMUSCULAR | Status: DC | PRN

## 2019-12-08 MED ORDER — DEXAMETHASONE SODIUM PHOSPHATE 4 MG/ML IJ SOLN
INTRAMUSCULAR | Status: AC
  Filled 2019-12-08: qty 1

## 2019-12-08 MED ORDER — NALBUPHINE HCL 10 MG/ML IJ SOLN: 5.0000 mg | INTRAMUSCULAR | Status: DC | PRN

## 2019-12-08 MED ORDER — SENNOSIDES-DOCUSATE SODIUM 8.6-50 MG PO TABS
2.0000 | ORAL_TABLET | ORAL | Status: DC
  Administered 2019-12-08 – 2019-12-10 (×3): 2 via ORAL
  Filled 2019-12-08 (×3): qty 2

## 2019-12-08 MED ORDER — LACTATED RINGERS IV SOLN: INTRAVENOUS | Status: DC

## 2019-12-08 MED ORDER — KETOROLAC TROMETHAMINE 30 MG/ML IJ SOLN: 30.0000 mg | Freq: Four times a day (QID) | INTRAMUSCULAR | Status: AC | PRN

## 2019-12-08 MED ORDER — NALBUPHINE HCL 10 MG/ML IJ SOLN: 5.0000 mg | Freq: Once | INTRAMUSCULAR | Status: DC | PRN

## 2019-12-08 MED ORDER — OXYCODONE HCL 5 MG/5ML PO SOLN: 5.0000 mg | Freq: Once | ORAL | Status: DC | PRN

## 2019-12-08 MED ORDER — STERILE WATER FOR IRRIGATION IR SOLN
Status: DC | PRN
  Administered 2019-12-08: 1000 mL

## 2019-12-08 MED ORDER — ZOLPIDEM TARTRATE 5 MG PO TABS: 5.0000 mg | ORAL_TABLET | Freq: Every evening | ORAL | Status: DC | PRN

## 2019-12-08 MED ORDER — OXYTOCIN-SODIUM CHLORIDE 30-0.9 UT/500ML-% IV SOLN
INTRAVENOUS | Status: AC
  Filled 2019-12-08: qty 500

## 2019-12-08 MED ORDER — KETOROLAC TROMETHAMINE 30 MG/ML IJ SOLN
INTRAMUSCULAR | Status: AC
  Filled 2019-12-08: qty 1

## 2019-12-08 MED ORDER — PHENYLEPHRINE HCL-NACL 20-0.9 MG/250ML-% IV SOLN
INTRAVENOUS | Status: DC | PRN
  Administered 2019-12-08: 60 ug/min via INTRAVENOUS

## 2019-12-08 MED ORDER — PRENATAL MULTIVITAMIN CH
1.0000 | ORAL_TABLET | Freq: Every day | ORAL | Status: DC
  Administered 2019-12-09 – 2019-12-11 (×3): 1 via ORAL
  Filled 2019-12-08 (×3): qty 1

## 2019-12-08 MED ORDER — HYDROMORPHONE HCL 1 MG/ML IJ SOLN: 0.2500 mg | INTRAMUSCULAR | Status: DC | PRN

## 2019-12-08 MED ORDER — ONDANSETRON HCL 4 MG/2ML IJ SOLN
INTRAMUSCULAR | Status: AC
  Filled 2019-12-08: qty 2

## 2019-12-08 MED ORDER — SCOPOLAMINE 1 MG/3DAYS TD PT72
1.0000 | MEDICATED_PATCH | Freq: Once | TRANSDERMAL | Status: AC
  Administered 2019-12-08: 1.5 mg via TRANSDERMAL

## 2019-12-08 MED ORDER — NALOXONE HCL 0.4 MG/ML IJ SOLN: 0.4000 mg | INTRAMUSCULAR | Status: DC | PRN

## 2019-12-08 MED ORDER — SODIUM CHLORIDE 0.9 % IR SOLN
Status: DC | PRN
  Administered 2019-12-08: 1000 mL

## 2019-12-08 MED ORDER — PROMETHAZINE HCL 25 MG/ML IJ SOLN: 6.2500 mg | INTRAMUSCULAR | Status: DC | PRN

## 2019-12-08 MED ORDER — SIMETHICONE 80 MG PO CHEW
80.0000 mg | CHEWABLE_TABLET | ORAL | Status: DC
  Administered 2019-12-08 – 2019-12-10 (×3): 80 mg via ORAL
  Filled 2019-12-08 (×3): qty 1

## 2019-12-08 MED ORDER — SODIUM CHLORIDE 0.9% FLUSH: 3.0000 mL | INTRAVENOUS | Status: DC | PRN

## 2019-12-08 MED ORDER — LACTATED RINGERS IV BOLUS
1000.0000 mL | Freq: Once | INTRAVENOUS | Status: AC
  Administered 2019-12-08: 1000 mL via INTRAVENOUS

## 2019-12-08 MED ORDER — OXYCODONE HCL 5 MG PO TABS: 5.0000 mg | ORAL_TABLET | Freq: Once | ORAL | Status: DC | PRN

## 2019-12-08 MED ORDER — DEXAMETHASONE SODIUM PHOSPHATE 4 MG/ML IJ SOLN
INTRAMUSCULAR | Status: DC | PRN
  Administered 2019-12-08: 5 mg via INTRAVENOUS

## 2019-12-08 MED ORDER — KETOROLAC TROMETHAMINE 30 MG/ML IJ SOLN
30.0000 mg | Freq: Four times a day (QID) | INTRAMUSCULAR | Status: AC | PRN
  Administered 2019-12-08 – 2019-12-09 (×2): 30 mg via INTRAVENOUS
  Filled 2019-12-08 (×2): qty 1

## 2019-12-08 MED ORDER — TETANUS-DIPHTH-ACELL PERTUSSIS 5-2.5-18.5 LF-MCG/0.5 IM SUSP: 0.5000 mL | Freq: Once | INTRAMUSCULAR | Status: DC

## 2019-12-08 MED ORDER — BUPIVACAINE IN DEXTROSE 0.75-8.25 % IT SOLN
INTRATHECAL | Status: DC | PRN
  Administered 2019-12-08: 1.6 mL via INTRATHECAL

## 2019-12-08 MED ORDER — OXYTOCIN-SODIUM CHLORIDE 30-0.9 UT/500ML-% IV SOLN
INTRAVENOUS | Status: DC | PRN
  Administered 2019-12-08 (×2): 150 mL via INTRAVENOUS

## 2019-12-08 MED ORDER — ONDANSETRON HCL 4 MG/2ML IJ SOLN
4.0000 mg | Freq: Three times a day (TID) | INTRAMUSCULAR | Status: DC | PRN
  Administered 2019-12-08: 4 mg via INTRAVENOUS
  Filled 2019-12-08: qty 2

## 2019-12-08 MED ORDER — SIMETHICONE 80 MG PO CHEW
80.0000 mg | CHEWABLE_TABLET | Freq: Three times a day (TID) | ORAL | Status: DC
  Administered 2019-12-09 – 2019-12-11 (×7): 80 mg via ORAL
  Filled 2019-12-08 (×8): qty 1

## 2019-12-08 MED ORDER — FENTANYL CITRATE (PF) 100 MCG/2ML IJ SOLN
INTRAMUSCULAR | Status: DC | PRN
Start: 2019-12-08 — End: 2019-12-08
  Administered 2019-12-08 (×2): 15 ug via INTRATHECAL

## 2019-12-08 MED ORDER — DIBUCAINE (PERIANAL) 1 % EX OINT: 1.0000 "application " | TOPICAL_OINTMENT | CUTANEOUS | Status: DC | PRN

## 2019-12-08 MED ORDER — SCOPOLAMINE 1 MG/3DAYS TD PT72
MEDICATED_PATCH | TRANSDERMAL | Status: AC
  Filled 2019-12-08: qty 1

## 2019-12-08 MED ORDER — FAMOTIDINE IN NACL 20-0.9 MG/50ML-% IV SOLN
20.0000 mg | Freq: Once | INTRAVENOUS | Status: AC
  Administered 2019-12-08: 20 mg via INTRAVENOUS
  Filled 2019-12-08: qty 50

## 2019-12-08 MED ORDER — OXYCODONE HCL 5 MG PO TABS
5.0000 mg | ORAL_TABLET | ORAL | Status: DC | PRN
  Filled 2019-12-08: qty 2

## 2019-12-08 MED ORDER — FENTANYL CITRATE (PF) 100 MCG/2ML IJ SOLN
INTRAMUSCULAR | Status: AC
  Filled 2019-12-08: qty 2

## 2019-12-08 MED ORDER — DIPHENHYDRAMINE HCL 50 MG/ML IJ SOLN: 12.5000 mg | INTRAMUSCULAR | Status: DC | PRN

## 2019-12-08 MED ORDER — WITCH HAZEL-GLYCERIN EX PADS: 1.0000 "application " | MEDICATED_PAD | CUTANEOUS | Status: DC | PRN

## 2019-12-08 MED ORDER — MORPHINE SULFATE (PF) 0.5 MG/ML IJ SOLN
INTRAMUSCULAR | Status: DC | PRN
Start: 2019-12-08 — End: 2019-12-08
  Administered 2019-12-08 (×2): .15 mg via INTRATHECAL

## 2019-12-08 MED ORDER — MORPHINE SULFATE (PF) 0.5 MG/ML IJ SOLN
INTRAMUSCULAR | Status: AC
  Filled 2019-12-08: qty 10

## 2019-12-08 MED ORDER — DIPHENHYDRAMINE HCL 25 MG PO CAPS: 25.0000 mg | ORAL_CAPSULE | Freq: Four times a day (QID) | ORAL | Status: DC | PRN

## 2019-12-08 MED ORDER — IBUPROFEN 800 MG PO TABS
800.0000 mg | ORAL_TABLET | Freq: Three times a day (TID) | ORAL | Status: DC
  Administered 2019-12-09 – 2019-12-11 (×6): 800 mg via ORAL
  Filled 2019-12-08 (×8): qty 1

## 2019-12-08 MED ORDER — OXYTOCIN-SODIUM CHLORIDE 30-0.9 UT/500ML-% IV SOLN: 2.5000 [IU]/h | INTRAVENOUS | Status: AC

## 2019-12-08 MED ORDER — KETOROLAC TROMETHAMINE 30 MG/ML IJ SOLN
30.0000 mg | Freq: Once | INTRAMUSCULAR | Status: AC | PRN
  Administered 2019-12-08: 30 mg via INTRAVENOUS

## 2019-12-08 MED ORDER — ONDANSETRON HCL 4 MG/2ML IJ SOLN
INTRAMUSCULAR | Status: DC | PRN
  Administered 2019-12-08: 4 mg via INTRAVENOUS

## 2019-12-08 MED ORDER — SOD CITRATE-CITRIC ACID 500-334 MG/5ML PO SOLN
30.0000 mL | Freq: Once | ORAL | Status: AC
  Administered 2019-12-08: 30 mL via ORAL
  Filled 2019-12-08: qty 30

## 2019-12-08 MED ORDER — SOD CITRATE-CITRIC ACID 500-334 MG/5ML PO SOLN: 30.0000 mL | ORAL | Status: DC

## 2019-12-08 MED ORDER — GABAPENTIN 300 MG PO CAPS
300.0000 mg | ORAL_CAPSULE | Freq: Two times a day (BID) | ORAL | Status: DC
  Administered 2019-12-08 – 2019-12-10 (×3): 300 mg via ORAL
  Filled 2019-12-08 (×5): qty 1

## 2019-12-08 MED ORDER — NALOXONE HCL 4 MG/10ML IJ SOLN
1.0000 ug/kg/h | INTRAVENOUS | Status: DC | PRN
  Filled 2019-12-08: qty 5

## 2019-12-08 MED ORDER — COCONUT OIL OIL
1.0000 "application " | TOPICAL_OIL | Status: DC | PRN
  Administered 2019-12-11: 1 via TOPICAL

## 2019-12-08 SURGICAL SUPPLY — 30 items
BENZOIN TINCTURE PRP APPL 2/3 (GAUZE/BANDAGES/DRESSINGS) ×2 IMPLANT
CHLORAPREP W/TINT 26 (MISCELLANEOUS) ×2 IMPLANT
CLAMP CORD UMBIL (MISCELLANEOUS) ×2 IMPLANT
CLOTH BEACON ORANGE TIMEOUT ST (SAFETY) ×2 IMPLANT
DRSG OPSITE POSTOP 4X10 (GAUZE/BANDAGES/DRESSINGS) ×2 IMPLANT
ELECT REM PT RETURN 9FT ADLT (ELECTROSURGICAL) ×2
ELECTRODE REM PT RTRN 9FT ADLT (ELECTROSURGICAL) ×1 IMPLANT
GLOVE BIO SURGEON STRL SZ7.5 (GLOVE) ×2 IMPLANT
GLOVE BIOGEL PI IND STRL 7.0 (GLOVE) ×2 IMPLANT
GLOVE BIOGEL PI INDICATOR 7.0 (GLOVE) ×2
GOWN STRL REUS W/TWL 2XL LVL3 (GOWN DISPOSABLE) ×2 IMPLANT
GOWN STRL REUS W/TWL LRG LVL3 (GOWN DISPOSABLE) ×4 IMPLANT
HEMOSTAT ARISTA ABSORB 3G PWDR (HEMOSTASIS) IMPLANT
KIT ABG SYR 3ML LUER SLIP (SYRINGE) ×2 IMPLANT
NEEDLE HYPO 25X5/8 SAFETYGLIDE (NEEDLE) ×2 IMPLANT
NS IRRIG 1000ML POUR BTL (IV SOLUTION) ×2 IMPLANT
PACK C SECTION WH (CUSTOM PROCEDURE TRAY) ×2 IMPLANT
PAD OB MATERNITY 4.3X12.25 (PERSONAL CARE ITEMS) ×2 IMPLANT
RTRCTR C-SECT PINK 25CM LRG (MISCELLANEOUS) ×2 IMPLANT
STRIP CLOSURE SKIN 1/2X4 (GAUZE/BANDAGES/DRESSINGS) ×2 IMPLANT
SUT PDS AB 0 CTX 36 PDP370T (SUTURE) IMPLANT
SUT PLAIN 2 0 (SUTURE)
SUT PLAIN ABS 2-0 CT1 27XMFL (SUTURE) IMPLANT
SUT VIC AB 0 CT1 36 (SUTURE) ×4 IMPLANT
SUT VIC AB 2-0 CT1 27 (SUTURE) ×2
SUT VIC AB 2-0 CT1 TAPERPNT 27 (SUTURE) ×2 IMPLANT
SUT VIC AB 4-0 KS 27 (SUTURE) ×2 IMPLANT
TOWEL OR 17X24 6PK STRL BLUE (TOWEL DISPOSABLE) ×4 IMPLANT
TRAY FOLEY W/BAG SLVR 14FR LF (SET/KITS/TRAYS/PACK) ×2 IMPLANT
WATER STERILE IRR 1000ML POUR (IV SOLUTION) ×2 IMPLANT

## 2019-12-08 NOTE — MAU Note (Signed)
Called OR CN, OB Anesthesia, AC.

## 2019-12-08 NOTE — Anesthesia Procedure Notes (Signed)
Spinal  Patient location during procedure: OR Start time: 12/08/2019 1:16 PM End time: 12/08/2019 1:19 PM Staffing Performed: anesthesiologist  Anesthesiologist: Lannie Fields, DO Preanesthetic Checklist Completed: patient identified, IV checked, risks and benefits discussed, surgical consent, monitors and equipment checked, pre-op evaluation and timeout performed Spinal Block Patient position: sitting Prep: DuraPrep and site prepped and draped Patient monitoring: cardiac monitor, continuous pulse ox and blood pressure Approach: midline Location: L3-4 Injection technique: single-shot Needle Needle type: Pencan  Needle gauge: 24 G Needle length: 9 cm Assessment Sensory level: T6 Additional Notes Functioning IV was confirmed and monitors were applied. Sterile prep and drape, including hand hygiene and sterile gloves were used. The patient was positioned and the spine was prepped. The skin was anesthetized with lidocaine.  Free flow of clear CSF was obtained prior to injecting local anesthetic into the CSF.  The spinal needle aspirated freely following injection.  The needle was carefully withdrawn.  The patient tolerated the procedure well.

## 2019-12-08 NOTE — MAU Note (Signed)
Presents with c/o ctxs and VB.  States ctxs are every 4-5 minutes apart since last night.  States VB like "a regular period".  Reports baby possibly breech and Cesarean.  Reports +FM

## 2019-12-08 NOTE — Discharge Summary (Addendum)
Postpartum Discharge Summary    Patient Name: Carolyn Joseph DOB: Jul 20, 1990 MRN: [id number]  Date of admission: 12/08/2019 Delivery date:12/08/2019  Delivering provider: Juanna Cao T  Date of discharge: 12/11/2019  Admitting diagnosis: Supervision of high-risk pregnancy [O09.90] Intrauterine pregnancy: [redacted]w[redacted]d    Secondary diagnosis:  Active Problems:   Bicornate uterus complicating pregnancy   History of incompetent cervix, currently pregnant   Supervision of high-risk pregnancy   Cesarean delivery delivered  Additional problems: as noted above  Discharge diagnosis: Cesarean delivery delivered                                 Post partum procedures: none Augmentation: N/A Complications: None  Hospital course: Sceduled C/S   29y.o. yo [id number] admitted to the hospital 12/08/2019 for contractions and vaginal bleeding in s/o breech presentation. Given breech presentation and history of prior Cesarean, pt elected for repeat Cesarean. Delivery details are as follows:  Membrane Rupture Time/Date: 1:49 PM ,12/08/2019   Delivery Method:C-Section, Low Transverse  Details of operation can be found in separate operative note. Patient had an uncomplicated postpartum course.  She is ambulating, tolerating a regular diet, passing flatus, and urinating well. Patient is discharged home in stable condition on  12/11/19        Newborn Data: Birth date:12/08/2019  Birth time:1:50 PM  Gender:Female  Living status:Living  Apgars:5 ,9  Weight:2790 g     Magnesium Sulfate received: No BMZ received: No Rhophylac:N/A MMR:N/A T-DaP:Given prenatally Flu: No Transfusion:No  Physical exam  Vitals:   12/10/19 0525 12/10/19 1443 12/10/19 2123 12/11/19 0500  BP: 119/83 125/87 134/80 122/79  Pulse: 75 81 75 63  Resp: _0 Temp: 97.9 F (36.6 C) 98.5 F (36.9 C) 98 F (36.7 C) 98.2 F (36.8 C)  TempSrc: Oral Oral Oral Oral  SpO2: 98% 99% 99%   Weight:       Height:       General: alert, cooperative and no distress Lochia: appropriate Uterine Fundus: firm Incision: Healing well with no significant drainage, No significant erythema, Dressing is clean, dry, and intact DVT Evaluation: No evidence of DVT seen on physical exam. No cords or calf tenderness. No significant calf/ankle edema. Labs: Lab Results  Component Value Date   WBC 10.9 (H) 12/09/2019   HGB 9.4 (L) 12/09/2019   HCT 29.5 (L) 12/09/2019   MCV 86.5 12/09/2019   PLT 158 12/09/2019   CMP Latest Ref Rng & Units 12/09/2019  Creatinine 0.44 - 1.00 mg/dL 0.60   Edinburgh Score: Edinburgh Postnatal Depression Scale Screening Tool 12/09/2019  I have been able to laugh and see the funny side of things. (No Data)  I have looked forward with enjoyment to things. -  I have blamed myself unnecessarily when things went wrong. -  I have been anxious or worried for no good reason. -  I have felt scared or panicky for no good reason. -  Things have been getting on top of me. -  I have been so unhappy that I have had difficulty sleeping. -  I have felt sad or miserable. -  I have been so unhappy that I have been crying. -  The thought of harming myself has occurred to me. - Flavia Shipperostnatal Depression Scale Total -     After visit meds:  Allergies as of 12/11/2019   No Known Allergies  Medication List    STOP taking these medications   docusate sodium 100 MG capsule Commonly known as: COLACE   oxyCODONE-acetaminophen 5-325 MG tablet Commonly known as: PERCOCET/ROXICET   progesterone 200 MG capsule Commonly known as: Prometrium     TAKE these medications   acetaminophen 325 MG tablet Commonly known as: TYLENOL Take 2 tablets (650 mg total) by mouth every 6 (six) hours.   coconut oil Oil Apply 1 application topically as needed.   ferrous sulfate 325 (65 FE) MG tablet Take 1 tablet (325 mg total) by mouth every other day.   ibuprofen 800 MG tablet Commonly  known as: ADVIL Take 1 tablet (800 mg total) by mouth every 8 (eight) hours.   oxyCODONE 5 MG immediate release tablet Commonly known as: Oxy IR/ROXICODONE Take 1 tablet (5 mg total) by mouth every 6 (six) hours as needed for severe pain or breakthrough pain.   prenatal multivitamin Tabs tablet Take 1 tablet by mouth daily at 12 noon.            Discharge Care Instructions  (From admission, onward)         Start     Ordered   12/11/19 0000  Leave dressing on - Keep it clean, dry, and intact until clinic visit        12/11/19 0719           Discharge home in stable condition Infant Feeding: Breast Infant Disposition:home with mother Discharge instruction: per After Visit Summary and Postpartum booklet. Activity: Advance as tolerated. Pelvic rest for 6 weeks.  Diet: routine diet Future Appointments: Future Appointments  Date Time Provider Sims  12/16/2019  9:00 AM [facility] NURSE [facility]  01/11/2020 10:15 AM Lajean Manes, CNM [facility]   Follow up Visit:  Follow-up Information    [facility] Follow up on 12/16/2019.   Why: for postpartum checkup Contact information: [place]              Please schedule this patient for a In person postpartum visit in 4 weeks with the following provider: Any provider. Additional Postpartum F/U:Incision check 1 week  High risk pregnancy complicated by: history of prior Cesarean, cervical insufficiency in s/o bicornate uterus s/p cerclage removal Delivery mode:  C-Section, Low Transverse  Anticipated Birth Control:  Pt received depo prior to discharge. Desires Nexplanon at post-partum appointment.  Randa Ngo, MD OB Fellow, Faculty Practice 12/11/2019 7:54 AM

## 2019-12-08 NOTE — Progress Notes (Signed)
Called by RN to evaluate dressing.   Recently s/p rLTCS, arrived to MB unit around 1600. Evaluated patient at bedside. Just received zofran, recently vomited and feeling less nauseous. Honey comb dressing completely saturated. Removed dressing with steri strips in place and no evidence of active bleeding from surgical site.   Placed new honeycomb with sterile technique. RN additionally placed pressure dressing over top. Discussed with RN to recheck honey comb in 1 hour and to call if any significant bleeding present.   Allayne Stack, DO

## 2019-12-08 NOTE — Anesthesia Preprocedure Evaluation (Signed)
Anesthesia Evaluation  Patient identified by MRN, date of birth, ID band Patient awake    Reviewed: Allergy & Precautions, NPO status , Patient's Chart, lab work & pertinent test results  Airway Mallampati: II  TM Distance: >3 FB Neck ROM: Full    Dental no notable dental hx.    Pulmonary former smoker,    Pulmonary exam normal breath sounds clear to auscultation       Cardiovascular negative cardio ROS Normal cardiovascular exam Rhythm:Regular Rate:Normal     Neuro/Psych negative neurological ROS  negative psych ROS   GI/Hepatic negative GI ROS, Neg liver ROS,   Endo/Other  negative endocrine ROS  Renal/GU negative Renal ROS  negative genitourinary   Musculoskeletal negative musculoskeletal ROS (+)   Abdominal   Peds negative pediatric ROS (+)  Hematology negative hematology ROS (+)   Anesthesia Other Findings   Reproductive/Obstetrics (+) Pregnancy 1 prior c section, 2 prior cerclages Presented to MAU ruptured, cerclage removed previously                             Anesthesia Physical Anesthesia Plan  ASA: II and emergent  Anesthesia Plan: Spinal   Post-op Pain Management:    Induction:   PONV Risk Score and Plan: 3 and Ondansetron, Dexamethasone and Treatment may vary due to age or medical condition  Airway Management Planned: Natural Airway and Nasal Cannula  Additional Equipment: None  Intra-op Plan:   Post-operative Plan:   Informed Consent: I have reviewed the patients History and Physical, chart, labs and discussed the procedure including the risks, benefits and alternatives for the proposed anesthesia with the patient or authorized representative who has indicated his/her understanding and acceptance.       Plan Discussed with: CRNA  Anesthesia Plan Comments:         Anesthesia Quick Evaluation

## 2019-12-08 NOTE — Anesthesia Postprocedure Evaluation (Signed)
Anesthesia Post Note  Patient: Carolyn Joseph  Procedure(s) Performed: CESAREAN SECTION (Abdomen)     Patient location during evaluation: PACU Anesthesia Type: Spinal Level of consciousness: awake and alert and oriented Pain management: pain level controlled Vital Signs Assessment: post-procedure vital signs reviewed and stable Respiratory status: spontaneous breathing, nonlabored ventilation and respiratory function stable Cardiovascular status: blood pressure returned to baseline and stable Postop Assessment: no headache, no backache, spinal receding and patient able to bend at knees Anesthetic complications: no   No complications documented.  Last Vitals:  Vitals:   12/08/19 1558 12/08/19 1607  BP: 125/73 (!) 153/97  Pulse: 64 61  Resp: 14 18  Temp:  (!) 36.4 C  SpO2: 100% 100%    Last Pain:  Vitals:   12/08/19 1610  TempSrc:   PainSc: 0-No pain   Pain Goal:    LLE Motor Response: Purposeful movement (12/08/19 1558) LLE Sensation: Tingling (12/08/19 1558) RLE Motor Response: Purposeful movement (12/08/19 1558) RLE Sensation: Tingling (12/08/19 1558)     Epidural/Spinal Function Cutaneous sensation: Normal sensation (12/08/19 1610), Patient able to flex knees: Yes (12/08/19 1610), Patient able to lift hips off bed: Yes (12/08/19 1610), Back pain beyond tenderness at insertion site: No (12/08/19 1610), Progressively worsening motor and/or sensory loss: No (12/08/19 1610), Bowel and/or bladder incontinence post epidural: No (12/08/19 1610)  Tennis Must [place]

## 2019-12-08 NOTE — Op Note (Signed)
Carolyn Joseph   PROCEDURE DATE: 12/08/2019  PREOPERATIVE DIAGNOSES: Intrauterine pregnancy at [redacted]w[redacted]d weeks gestation; breech presentation, h/o prior Cesarean  POSTOPERATIVE DIAGNOSES: The same  PROCEDURE: Repeat Low Transverse Cesarean Section  SURGEON:  Dr. Raynelle Dick, MD   Dr. Lynnda Shields, MD  ANESTHESIOLOGY TEAM: Anesthesiologist: Lannie Fields, DO CRNA: Cleda Clarks, CRNA  INDICATIONS: Carolyn Joseph is a 29 y.o. [phone] at [redacted]w[redacted]d here for cesarean section secondary to the indications listed under preoperative diagnoses; please see preoperative note for further details.  The risks of surgery were discussed with the patient including but were not limited to: bleeding which may require transfusion or reoperation; infection which may require antibiotics; injury to bowel, bladder, ureters or other surrounding organs; injury to the fetus; need for additional procedures including hysterectomy in the event of a life-threatening hemorrhage; formation of adhesions; placental abnormalities wth subsequent pregnancies; incisional problems; thromboembolic phenomenon and other postoperative/anesthesia complications.  The patient concurred with the proposed plan, giving informed written consent for the procedure.    FINDINGS:  Viable female infant in cephalic presentation.  Apgars 5 and 9.  Clear amniotic fluid.  Intact placenta, three vessel cord.  Normal uterus, fallopian tubes and ovaries bilaterally.  ANESTHESIA: Spinal INTRAVENOUS FLUIDS: 2,000 ml   ESTIMATED BLOOD LOSS: 338 ml URINE OUTPUT:  600 ml SPECIMENS: Placenta sent to L&D COMPLICATIONS: None immediate  PROCEDURE IN DETAIL:  The patient preoperatively received intravenous antibiotics and had sequential compression devices applied to her lower extremities.  She was then taken to the operating room where spinal anesthesia was administered and was found to be adequate. She was then placed in a dorsal  supine position with a leftward tilt, and prepped and draped in a sterile manner.  A foley catheter was placed into her bladder and attached to constant gravity.  After an adequate timeout was performed, a Pfannenstiel skin incision was made with scalpel on her preexisting scar and carried through to the underlying layer of fascia. The fascia was incised in the midline, and this incision was extended bilaterally using the Mayo scissors.  Kocher clamps were applied to the superior aspect of the fascial incision and the underlying rectus muscles were dissected off bluntly and sharply.  The rectus muscles were separated in the midline and the peritoneum was entered bluntly. The Alexis self-retaining retractor was introduced into the abdominal cavity.  Attention was turned to the lower uterine segment where a low transverse hysterotomy was made with a scalpel and extended bilaterally bluntly.  The infant was successfully delivered using breech maneuvers, the cord was quickly clamped given initial Apgar of 5, and the infant was handed over to the awaiting neonatology team and then placed on mom's chest shortly thereafter. Uterine massage was then administered, and the placenta delivered intact with a three-vessel cord. The uterus was then cleared of clots and debris.  The hysterotomy was closed with 0 Vicryl in a running locked fashion, and an imbricating layer was also placed with 0 Vicryl. The pelvis was cleared of all clot and debris. Hemostasis was confirmed on all surfaces.  The retractor was removed.  The peritoneum was closed with a 0 Vicryl running stitches. The fascia was then closed using 0 PDS in a running fashion.  The subcutaneous layer was irrigated, and reapproximated with 2-0 plain gut interrupted stitches, and the skin was closed with a 4-0 Vicryl subcuticular stitch. The patient tolerated the procedure well. Sponge, instrument and needle counts were correct x 3.  She was taken  to the recovery room in  stable condition.   Sheila Oats, MD OB Fellow, Faculty Practice 12/08/2019 4:58 PM

## 2019-12-08 NOTE — Transfer of Care (Signed)
Immediate Anesthesia Transfer of Care Note  Patient: Carolyn Joseph  Procedure(s) Performed: CESAREAN SECTION (Abdomen)  Patient Location: PACU  Anesthesia Type:Spinal  Level of Consciousness: awake, alert  and oriented  Airway & Oxygen Therapy: Patient Spontanous Breathing  Post-op Assessment: Report given to RN and Post -op Vital signs reviewed and stable  Post vital signs: Reviewed and stable  Last Vitals:  Vitals Value Taken Time  BP 125/82 12/08/19 1448  Temp    Pulse 73 12/08/19 1451  Resp 15 12/08/19 1451  SpO2 100 % 12/08/19 1451  Vitals shown include unvalidated device data.  Last Pain:  Vitals:   12/08/19 0834  TempSrc: Oral  PainSc:          Complications: No complications documented.

## 2019-12-08 NOTE — H&P (Addendum)
OBSTETRIC ADMISSION HISTORY AND PHYSICAL  Carolyn Joseph is a 29 y.o. female [phone] with IUP at [redacted]w[redacted]d by -/10 presenting for contractions and vaginal bleeding in the setting of breech fetal presentation. She reports +FMs, No LOF, no blurry vision, headaches or peripheral edema, and RUQ pain.  She plans on breast and bottle feeding. She request  for birth control.  She received her prenatal care at [facility]   Dating: By -/10 --->  Estimated Date of Delivery: 12/18/19  Sono:  @[redacted]w[redacted]d , CWD, normal anatomy, breech left uterus presentation,  2581g, 31% EFW  Prenatal History/Complications: cerclage placement secondary to cervical insufficiency  Past Medical History: Past Medical History:  Diagnosis Date  . Bicornate uterus   . Hip fracture (HCC) 2015  . Ovarian cyst     Past Surgical History: Past Surgical History:  Procedure Laterality Date  . CERVICAL CERCLAGE N/A 01/20/2018   Procedure: CERCLAGE CERVICAL;  Surgeon: 01/22/2018, MD;  Location: [facility] BIRTHING SUITES;  Service: Gynecology;  Laterality: N/A;  . CERVICAL CERCLAGE N/A 06/16/2019   Procedure: CERCLAGE CERVICAL;  Surgeon: 06/18/2019, MD;  Location: [facility] LD ORS;  Service: Gynecology;  Laterality: N/A;  . CESAREAN SECTION N/A 06/10/2018   Procedure: CESAREAN SECTION;  Surgeon: 06/12/2018, MD;  Location: [facility] LD ORS;  Service: Obstetrics;  Laterality: N/A;  . HIP FRACTURE SURGERY Bilateral 2015    Obstetrical History: OB History    Gravida  4   Para  1   Term  1   Preterm  0   AB  2   Living  1     SAB  2   TAB  0   Ectopic  0   Multiple  0   Live Births  1           Social History Social History   Socioeconomic History  . Marital status: Married    Spouse name: Not on file  . Number of children: Not on file  . Years of education: Not on file  . Highest education level: High school graduate  Occupational History  . Not on file  Tobacco Use  . Smoking status: Former Smoker     Types: Cigarettes  . Smokeless tobacco: Never Used  . Tobacco comment: in high school  Vaping Use  . Vaping Use: Never used  Substance and Sexual Activity  . Alcohol use: Not Currently  . Drug use: Never  . Sexual activity: Yes    Birth control/protection: Condom  Other Topics Concern  . Not on file  Social History Narrative  . Not on file   Social Determinants of Health   Financial Resource Strain:   . Difficulty of Paying Living Expenses: Not on file  Food Insecurity: No Food Insecurity  . Worried About 2016 in the Last Year: Never true  . Ran Out of Food in the Last Year: Never true  Transportation Needs: No Transportation Needs  . Lack of Transportation (Medical): No  . Lack of Transportation (Non-Medical): No  Physical Activity:   . Days of Exercise per Week: Not on file  . Minutes of Exercise per Session: Not on file  Stress:   . Feeling of Stress : Not on file  Social Connections:   . Frequency of Communication with Friends and Family: Not on file  . Frequency of Social Gatherings with Friends and Family: Not on file  . Attends Religious Services: Not on file  . Active Member of Clubs or  Organizations: Not on file  . Attends Banker Meetings: Not on file  . Marital Status: Not on file    Family History: Family History  Problem Relation Age of Onset  . Diabetes Mother   . Hypertension Mother   . Diabetes Maternal Grandmother   . Asthma Neg Hx   . Cancer Neg Hx   . Heart disease Neg Hx   . Stroke Neg Hx     Allergies: No Known Allergies  Medications Prior to Admission  Medication Sig Dispense Refill Last Dose  . docusate sodium (COLACE) 100 MG capsule Take 1 capsule (100 mg total) by mouth 2 (two) times daily as needed. (Patient not taking: Reported on 11/18/2019) 30 capsule 2   . oxyCODONE-acetaminophen (PERCOCET/ROXICET) 5-325 MG tablet Take 1 tablet by mouth every 6 (six) hours as needed. (Patient not taking: Reported on  07/02/2019) 5 tablet 0   . Prenatal Vit-Fe Fumarate-FA (PRENATAL MULTIVITAMIN) TABS tablet Take 1 tablet by mouth daily at 12 noon.     . progesterone (PROMETRIUM) 200 MG capsule Place 1 capsule (200 mg total) vaginally at bedtime. 30 capsule 5      Review of Systems   All systems reviewed and negative except as stated in HPI  Blood pressure 129/73, pulse 77, temperature 98.4 F (36.9 C), resp. rate 18, height 5\' 6"  (1.676 m), weight 84.6 kg, last menstrual period 03/06/2019, SpO2 99 %, unknown if currently breastfeeding. General appearance: alert, cooperative and appears stated age Lungs: clear to auscultation bilaterally Heart: regular rate and rhythm Abdomen: soft, non-tender; bowel sounds normal Pelvic: as noted below Extremities: Homans sign is negative, no sign of DVT Presentation: breech Fetal monitoring: Category 1 strip Uterine activity: regular Dilation: 3 Effacement (%): 60 Exam by:: T LYTLE RN   Prenatal labs: ABO, Rh: --/--/O POS (09/14 1017) Antibody: NEG (09/14 1017) Rubella: 2.66 (03/01 1657) RPR: Non Reactive (07/28 0917)  HBsAg: Negative (03/01 1657)  HIV: Non Reactive (07/28 0917)  GBS: Negative/-- (09/09 1158)  1 hr Glucola wnl Genetic screening  normal Anatomy 01-11-2002 wnl  Prenatal Transfer Tool  Maternal Diabetes: No Genetic Screening: Normal Maternal Ultrasounds/Referrals: Normal except for maternal bicornate uterus Fetal Ultrasounds or other Referrals:  None Maternal Substance Abuse:  No Significant Maternal Medications:  None Significant Maternal Lab Results: Group B Strep positive  Results for orders placed or performed during the hospital encounter of 12/08/19 (from the past 24 hour(s))  SARS Coronavirus 2 by RT PCR (hospital order, performed in [facility] hospital lab) Nasopharyngeal Nasopharyngeal Swab   Collection Time: 12/08/19 10:02 AM   Specimen: Nasopharyngeal Swab  Result Value Ref Range   SARS Coronavirus 2 NEGATIVE NEGATIVE  CBC    Collection Time: 12/08/19 10:03 AM  Result Value Ref Range   WBC 9.9 4.0 - 10.5 K/uL   RBC 4.19 3.87 - 5.11 MIL/uL   Hemoglobin 11.9 (L) 12.0 - 15.0 g/dL   HCT 12/10/19 36 - 46 %   MCV 88.8 80.0 - 100.0 fL   MCH 28.4 26.0 - 34.0 pg   MCHC 32.0 30.0 - 36.0 g/dL   RDW 81.0 17.5 - 10.2 %   Platelets 182 150 - 400 K/uL   nRBC 0.0 0.0 - 0.2 %  Urinalysis, Routine w reflex microscopic Urine, Clean Catch   Collection Time: 12/08/19 10:08 AM  Result Value Ref Range   Color, Urine YELLOW YELLOW   APPearance CLEAR CLEAR   Specific Gravity, Urine 1.013 1.005 - 1.030   pH  6.0 5.0 - 8.0   Glucose, UA NEGATIVE NEGATIVE mg/dL   Hgb urine dipstick MODERATE (A) NEGATIVE   Bilirubin Urine NEGATIVE NEGATIVE   Ketones, ur NEGATIVE NEGATIVE mg/dL   Protein, ur 30 (A) NEGATIVE mg/dL   Nitrite NEGATIVE NEGATIVE   Leukocytes,Ua SMALL (A) NEGATIVE   RBC / HPF 0-5 0 - 5 RBC/hpf   WBC, UA 0-5 0 - 5 WBC/hpf   Bacteria, UA RARE (A) NONE SEEN   Squamous Epithelial / LPF 0-5 0 - 5  Type and screen [facility]   Collection Time: 12/08/19 10:17 AM  Result Value Ref Range   ABO/RH(D) O POS    Antibody Screen NEG    Sample Expiration      12/11/2019,2359 Performed at [facility], [address] [place], [place] [address]     Patient Active Problem List   Diagnosis Date Noted  . Supervision of high-risk pregnancy 12/08/2019  . History of cesarean delivery 05/25/2019  . Supervision of high risk pregnancy, antepartum 05/21/2019  . History of incompetent cervix, currently pregnant 05/21/2019  . History of prior pregnancy with IUGR newborn 05/21/2019  . Short interval between pregnancies affecting pregnancy, antepartum 05/21/2019  . Bicornate uterus complicating pregnancy 11/18/2017    Assessment/Plan:  Versie Joseph is a 29 y.o. [id number] at [redacted]w[redacted]d here for repeat Cesarean secondary to breech presentation.  #Labor: N/A. Consented for Cesarean secondary to breech  presentation. The risks of cesarean section were discussed with the patient including but were not limited to: bleeding which may require transfusion or reoperation; infection which may require antibiotics; injury to bowel, bladder, ureters or other surrounding organs; injury to the fetus; need for additional procedures including hysterectomy in the event of a life-threatening hemorrhage; placental abnormalities wth subsequent pregnancies, incisional problems, thromboembolic phenomenon and other postoperative/anesthesia complications. The patient concurred with the proposed plan, giving informed written consent for the procedure.  Patient has been NPO since last night, and she will remain NPO for procedure. Anesthesia and OR aware.  Preoperative prophylactic antibiotics and SCDs ordered on call to the OR.  To OR when ready.  #Pain: Per anesthesia #FWB:  +FM, +FHTs #ID: GBS positive--plan for ancef 2g prior to delivery #MOF: breast #MOC: undecided--considering IUD at [facility] appt #Circ:  N/A #Cervical Insufficiency in s/o Bicornate Uterus: cerclage removed at last office visit  Sheila Oats, MD  12/08/2019, 12:54 PM

## 2019-12-09 DIAGNOSIS — Z98891 History of uterine scar from previous surgery: Secondary | ICD-10-CM

## 2019-12-09 DIAGNOSIS — O99893 Other specified diseases and conditions complicating puerperium: Secondary | ICD-10-CM

## 2019-12-09 DIAGNOSIS — Q513 Bicornate uterus: Secondary | ICD-10-CM

## 2019-12-09 LAB — RPR
RPR Ser Ql: REACTIVE — AB
RPR Titer: 1:1 {titer}

## 2019-12-09 LAB — CBC
HCT: 29.5 % — ABNORMAL LOW (ref 36.0–46.0)
Hemoglobin: 9.4 g/dL — ABNORMAL LOW (ref 12.0–15.0)
MCH: 27.6 pg (ref 26.0–34.0)
MCHC: 31.9 g/dL (ref 30.0–36.0)
MCV: 86.5 fL (ref 80.0–100.0)
Platelets: 158 10*3/uL (ref 150–400)
RBC: 3.41 MIL/uL — ABNORMAL LOW (ref 3.87–5.11)
RDW: 15.2 % (ref 11.5–15.5)
WBC: 10.9 10*3/uL — ABNORMAL HIGH (ref 4.0–10.5)
nRBC: 0 % (ref 0.0–0.2)

## 2019-12-09 LAB — CREATININE, SERUM
Creatinine, Ser: 0.6 mg/dL (ref 0.44–1.00)
GFR calc Af Amer: >60 mL/min (ref >60)
GFR calc non Af Amer: >60 mL/min (ref >60)

## 2019-12-09 MED ORDER — FERROUS SULFATE 325 (65 FE) MG PO TABS
325.0000 mg | ORAL_TABLET | ORAL | Status: DC
  Administered 2019-12-09: 325 mg via ORAL
  Filled 2019-12-09: qty 1

## 2019-12-09 NOTE — Lactation Note (Signed)
This note was copied from a baby's chart. Lactation Consultation Note  Patient Name: Carolyn Joseph Today's Date: 12/09/2019 Reason for consult: Initial assessment;Early term 37-38.6wks P2, 11 hour ETI female infant. Mom's hx: C/S infant was breech. Per mom, she BF her 99 month old daughter for 9 months and mom is active on the [facility] program in [place]. Mom has DEBP at home. LC entered room, mom had 7 mls of EBM pumped in bottle, mom is using DEBP to supplement infant with her own EBM at this time. Mom has breastfeeding supplemental guideline sheet based on infant's age / hours of life. Per mom, infant is latching well, LC entered room infant was cuing to breastfeed, mom latch infant on her right breast using the football hold position. Mom and LC could hear swallows, infant sustained latch and breastfed for 15 minutes afterwards infant was given 7 mls of colostrum using bottle with slow flow nipple. Mom plans to use DEBP again after she finish giving infant her EBM. Mom knows to breastfeed infant according to hunger cues, 8 to 12+ times within 24 hours. Mom knows to ask RN or LC if she has any questions, concerns or need assistance  with latching infant at the breast. Mom will use DEBP every 3 hours for 15 minutes on initial setting. Mom shown how to use DEBP & how to disassemble, clean, & reassemble parts. Mom made aware of O/P services, breastfeeding support groups, community resources, and our phone # for post-discharge questions.    Maternal Data Formula Feeding for Exclusion: No Has patient been taught Hand Expression?: Yes Does the patient have breastfeeding experience prior to this delivery?: Yes  Feeding Feeding Type: Breast Fed  LATCH Score Latch: Grasps breast easily, tongue down, lips flanged, rhythmical sucking.  Audible Swallowing: Spontaneous and intermittent  Type of Nipple: Everted at rest and after stimulation  Comfort (Breast/Nipple): Soft /  non-tender  Hold (Positioning): Assistance needed to correctly position infant at breast and maintain latch.  LATCH Score: 9  Interventions Interventions: Breast feeding basics reviewed;Breast compression;Adjust position;Support pillows;Position options;DEBP;Breast massage;Skin to skin;Assisted with latch  Lactation Tools Discussed/Used Tools: Bottle WIC Program: Yes Pump Review: Setup, frequency, and cleaning;Milk Storage Initiated by:: RN Date initiated:: 12/08/19   Consult Status Consult Status: Follow-up Date: 12/09/19 Follow-up type: In-patient    Danelle Earthly 12/09/2019, 1:01 AM

## 2019-12-09 NOTE — Progress Notes (Addendum)
POSTPARTUM PROGRESS NOTE  Subjective: Carolyn Joseph is a 29 y.o. 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  She reports she doing well. No acute events overnight. She denies any problems with voiding or po intake. Reports dizziness and vomiting with attempting to ambulate to the restroom last night. Multiple episodes of nausea and vomiting after c-section with improvement this morning. She has not passed flatus. Pain is well controlled.  Lochia is moderate.   Objective: Blood pressure 123/88, pulse 69, temperature (!) 97.5 F (36.4 C), temperature source Oral, resp. rate 18, height 5\' 6"  (1.676 m), weight 84.6 kg, last menstrual period 03/06/2019, SpO2 100 %, unknown if currently breastfeeding.  Physical Exam:  General: alert, cooperative and no distress Chest: no respiratory distress Abdomen: soft, non-tender  Uterine Fundus: firm and at level of umbilicus, incision site c/d/i, new honeycomb in place without any evidence of saturation  Extremities: No calf swelling or tenderness  no edema  Recent Labs    12/08/19 1003 12/09/19 0554  HGB 11.9* 9.4*  HCT 37.2 29.5*    Assessment/Plan: Carolyn Joseph is a 29 y.o. 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  Routine Postpartum Care: Doing better this morning, pain well-controlled. Nausea is improving. -- Continue routine care, lactation support  -- Contraception: desires Nexplanon but patient currently uninsured, however would qualify for medicaid. Will discuss if possible to do inpatient. Would like print-out information to read. -- Feeding: Breast feeding -- Honeycomb dressing: Dry and intact this morning. Nursing staff reported saturation of dressing last night after vomiting.  Anemia from acute blood loss: Hgb 11.9>>9.4. Plan to initiate oral iron supplements.  Dispo: Plan for discharge POD#2 or 3 if pain and bleeding well controlled.  [redacted]w[redacted]d 12/09/2019 9:10 AM   I saw and evaluated the patient. I agree with the findings and the plan of care as documented in the student's note.   12/11/2019, MD [facility] Family Medicine Fellow, [facility] for [facility], [facility]

## 2019-12-09 NOTE — Lactation Note (Signed)
This note was copied from a baby's chart. Lactation Consultation Note Baby 32 hrs old. Mom states BF going well. Mom is BF/formula feeding after BF. Mom is experienced BF mom for 9 months to her now 3 months old. Mom states that since she BF her 1st child so long she isn't having any difficulty w/this baby. Encouraged mom to call if she has any questions or needs assistance. Mom denies painful latches. Mom spoke English to this LC. RN who speaks Spanish at bedside. Mom didn't need translation at this time. LC put mom as PRN. Mom will call for [facility] assistance if needed.  Patient Name: Carolyn Joseph Today's Date: 12/09/2019 Reason for consult: Follow-up assessment;Early term 37-38.6wks   Maternal Data    Feeding Feeding Type: Formula Nipple Type: Extra Slow Flow  LATCH Score                   Interventions    Lactation Tools Discussed/Used     Consult Status Consult Status: PRN Date: 12/10/19 Follow-up type: In-patient    Charyl Dancer 12/09/2019, 10:36 PM

## 2019-12-10 ENCOUNTER — Encounter: Payer: Self-pay | Admitting: Obstetrics and Gynecology

## 2019-12-10 LAB — T.PALLIDUM AB, TOTAL: T Pallidum Abs: NONREACTIVE

## 2019-12-10 MED ORDER — FERROUS SULFATE 325 (65 FE) MG PO TABS
325.0000 mg | ORAL_TABLET | Freq: Every day | ORAL | Status: DC
  Administered 2019-12-11: 325 mg via ORAL
  Filled 2019-12-10: qty 1

## 2019-12-10 NOTE — Progress Notes (Signed)
Subjective: Postpartum Day 2: Cesarean Delivery Patient reports incisional pain, tolerating PO, + flatus and no problems voiding.    Objective: Vital signs in last 24 hours: Temp:  [97.9 F (36.6 C)-98.4 F (36.9 C)] 97.9 F (36.6 C) (09/16 0525) Pulse Rate:  [73-75] 75 (09/16 0525) Resp:  [16-18] 16 (09/16 0525) BP: (119-125)/(75-83) 119/83 (09/16 0525) SpO2:  [98 %-99 %] 98 % (09/16 0525)  Physical Exam:  General: alert, cooperative, appears stated age and no distress Lochia: appropriate Uterine Fundus: firm Incision: healing well, no significant erythema, dried blood on honeycomb DVT Evaluation: No evidence of DVT seen on physical exam. No cords or calf tenderness.  Recent Labs    12/08/19 1003 12/09/19 0554  HGB 11.9* 9.4*  HCT 37.2 29.5*    Assessment/Plan: Status post Cesarean section. Doing well postoperatively.  Continue current care.  Dispo: tomorrow or later today pending home accomodations  Sabino Dick 12/10/2019, 8:57 AM

## 2019-12-10 NOTE — Lactation Note (Signed)
This note was copied from a baby's chart. Lactation Consultation Note  Patient Name: Carolyn Joseph Today's Date: 12/10/2019 Reason for consult: Follow-up assessment;Early term 37-38.6wks;Infant weight loss  Visited with mom of a 24 hours old ETI female, she's a P2 and chooses to do both, breast and formula feeding. Baby already getting 30 ml of Gerber Gentle, mom and baby might be going home today; baby is at 4% weight loss.  Reviewed discharge instructions, engorgement prevention/treatment, treatment/prevention of sore nipples. Mom had baby laying down on her bed when entering the room, she had put baby on a pillow; she was falling asleep or just waking up with LC and [facility] student Waymond Cera entered the room. Educated mom about SIDS and offered to put baby on the bassinet but she declined.  Night shift RN had already discussed this issue with mom, but LC also reinforced it. LC also cleared out the area on baby's bassinet, a pack of Gerber Gentle and the call bell clicker were inside baby's bassinet.   She inquired about a pump rental, referred her to the gift shop, she was set up with a DEBP in her room but has only used it once because "nothing came out". Explained to mom that the purpose of pumping this early on is mainly for breast stimulation and not to get volume. Mom reported all questions and concerns were answered, she's aware of LC OP services and will call PRN.  Maternal Data    Feeding Feeding Type: Formula Nipple Type: Extra Slow Flow  LATCH Score                   Interventions Interventions: Breast feeding basics reviewed;DEBP  Lactation Tools Discussed/Used Tools: Pump Breast pump type: Double-Electric Breast Pump   Consult Status Consult Status: Complete Date: 12/10/19 Follow-up type: Call as needed    Carolyn Joseph 12/10/2019, 8:21 AM

## 2019-12-10 NOTE — Discharge Instructions (Signed)
Cesarean Delivery, Care After This sheet gives you information about how to care for yourself after your procedure. Your health care provider may also give you more specific instructions. If you have problems or questions, contact your health care provider. What can I expect after the procedure? After the procedure, it is common to have:  A small amount of blood or clear fluid coming from the incision.  Some redness, swelling, and pain in your incision area.  Some abdominal pain and soreness.  Vaginal bleeding (lochia). Even though you did not have a vaginal delivery, you will still have vaginal bleeding and discharge.  Pelvic cramps.  Fatigue. You may have pain, swelling, and discomfort in the tissue between your vagina and your anus (perineum) if:  Your C-section was unplanned, and you were allowed to labor and push.  An incision was made in the area (episiotomy) or the tissue tore during attempted vaginal delivery. Follow these instructions at home: Incision care   Follow instructions from your health care provider about how to take care of your incision. Make sure you: ? Wash your hands with soap and water before you change your bandage (dressing). If soap and water are not available, use hand sanitizer. ? If you have a dressing, change it or remove it as told by your health care provider. ? Leave stitches (sutures), skin staples, skin glue, or adhesive strips in place. These skin closures may need to stay in place for 2 weeks or longer. If adhesive strip edges start to loosen and curl up, you may trim the loose edges. Do not remove adhesive strips completely unless your health care provider tells you to do that.  Check your incision area every day for signs of infection. Check for: ? More redness, swelling, or pain. ? More fluid or blood. ? Warmth. ? Pus or a bad smell.  Do not take baths, swim, or use a hot tub until your health care provider says it's okay. Ask your health  care provider if you can take showers.  When you cough or sneeze, hug a pillow. This helps with pain and decreases the chance of your incision opening up (dehiscing). Do this until your incision heals. Medicines  Take over-the-counter and prescription medicines only as told by your health care provider.  If you were prescribed an antibiotic medicine, take it as told by your health care provider. Do not stop taking the antibiotic even if you start to feel better.  Do not drive or use heavy machinery while taking prescription pain medicine. Lifestyle  Do not drink alcohol. This is especially important if you are breastfeeding or taking pain medicine.  Do not use any products that contain nicotine or tobacco, such as cigarettes, e-cigarettes, and chewing tobacco. If you need help quitting, ask your health care provider. Eating and drinking  Drink at least 8 eight-ounce glasses of water every day unless told not to by your health care provider. If you breastfeed, you may need to drink even more water.  Eat high-fiber foods every day. These foods may help prevent or relieve constipation. High-fiber foods include: ? Whole grain cereals and breads. ? Brown rice. ? Beans. ? Fresh fruits and vegetables. Activity   If possible, have someone help you care for your baby and help with household activities for at least a few days after you leave the hospital.  Return to your normal activities as told by your health care provider. Ask your health care provider what activities are safe for   you.  Rest as much as possible. Try to rest or take a nap while your baby is sleeping.  Do not lift anything that is heavier than 10 lbs (4.5 kg), or the limit that you were told, until your health care provider says that it is safe.  Talk with your health care provider about when you can engage in sexual activity. This may depend on your: ? Risk of infection. ? How fast you heal. ? Comfort and desire to  engage in sexual activity. General instructions  Do not use tampons or douches until your health care provider approves.  Wear loose, comfortable clothing and a supportive and well-fitting bra.  Keep your perineum clean and dry. Wipe from front to back when you use the toilet.  If you pass a blood clot, save it and call your health care provider to discuss. Do not flush blood clots down the toilet before you get instructions from your health care provider.  Keep all follow-up visits for you and your baby as told by your health care provider. This is important. Contact a health care provider if:  You have: ? A fever. ? Bad-smelling vaginal discharge. ? Pus or a bad smell coming from your incision. ? Difficulty or pain when urinating. ? A sudden increase or decrease in the frequency of your bowel movements. ? More redness, swelling, or pain around your incision. ? More fluid or blood coming from your incision. ? A rash. ? Nausea. ? Little or no interest in activities you used to enjoy. ? Questions about caring for yourself or your baby.  Your incision feels warm to the touch.  Your breasts turn red or become painful or hard.  You feel unusually sad or worried.  You vomit.  You pass a blood clot from your vagina.  You urinate more than usual.  You are dizzy or light-headed. Get help right away if:  You have: ? Pain that does not go away or get better with medicine. ? Chest pain. ? Difficulty breathing. ? Blurred vision or spots in your vision. ? Thoughts about hurting yourself or your baby. ? New pain in your abdomen or in one of your legs. ? A severe headache.  You faint.  You bleed from your vagina so much that you fill more than one sanitary pad in one hour. Bleeding should not be heavier than your heaviest period. Summary  After the procedure, it is common to have pain at your incision site, abdominal cramping, and slight bleeding from your vagina.  Check  your incision area every day for signs of infection.  Tell your health care provider about any unusual symptoms.  Keep all follow-up visits for you and your baby as told by your health care provider. This information is not intended to replace advice given to you by your health care provider. Make sure you discuss any questions you have with your health care provider. Document Revised: 09/18/2017 Document Reviewed: 09/18/2017 Elsevier Patient Education  2020 [employer]  

## 2019-12-10 NOTE — Progress Notes (Signed)
Honeycomb dressing changed using sterile technique. No drainage noted at site

## 2019-12-11 MED ORDER — FERROUS SULFATE 325 (65 FE) MG PO TABS: 325.0000 mg | ORAL_TABLET | ORAL | 0 refills | Status: DC

## 2019-12-11 MED ORDER — FERROUS SULFATE 325 (65 FE) MG PO TABS: 325.0000 mg | ORAL_TABLET | Freq: Every day | ORAL | 3 refills | Status: DC

## 2019-12-11 MED ORDER — IBUPROFEN 800 MG PO TABS
800.0000 mg | ORAL_TABLET | Freq: Three times a day (TID) | ORAL | 0 refills | Status: DC
Start: 2019-12-11 — End: 2020-07-28

## 2019-12-11 MED ORDER — COCONUT OIL OIL: 1.0000 "application " | TOPICAL_OIL | 0 refills | Status: DC | PRN

## 2019-12-11 MED ORDER — ACETAMINOPHEN 325 MG PO TABS
650.0000 mg | ORAL_TABLET | Freq: Four times a day (QID) | ORAL | 0 refills | Status: DC
Start: 2019-12-11 — End: 2023-07-16

## 2019-12-11 MED ORDER — OXYCODONE HCL 5 MG PO TABS
5.0000 mg | ORAL_TABLET | Freq: Four times a day (QID) | ORAL | 0 refills | Status: DC | PRN
Start: 2019-12-11 — End: 2019-12-14

## 2019-12-11 MED ORDER — IBUPROFEN 800 MG PO TABS: 800.0000 mg | ORAL_TABLET | Freq: Three times a day (TID) | ORAL | 0 refills | Status: DC

## 2019-12-11 MED ORDER — PRENATAL MULTIVITAMIN CH: 1.0000 | ORAL_TABLET | Freq: Every day | ORAL | Status: AC

## 2019-12-11 MED ORDER — MEDROXYPROGESTERONE ACETATE 150 MG/ML IM SUSP
150.0000 mg | Freq: Once | INTRAMUSCULAR | Status: DC
  Filled 2019-12-11: qty 1

## 2019-12-11 MED ORDER — ACETAMINOPHEN 325 MG PO TABS: 650.0000 mg | ORAL_TABLET | Freq: Four times a day (QID) | ORAL | 0 refills | Status: DC

## 2019-12-11 MED ORDER — OXYCODONE HCL 5 MG PO TABS
5.0000 mg | ORAL_TABLET | Freq: Four times a day (QID) | ORAL | 0 refills | Status: DC | PRN
Start: 2019-12-11 — End: 2019-12-11

## 2019-12-11 NOTE — Lactation Note (Signed)
This note was copied from a baby's chart. Lactation Consultation Note  Patient Name: Carolyn Joseph Today's Date: 12/11/2019 Reason for consult: Follow-up assessment   P2, Baby 68 hours old and latched upon entering for approx 5 min.  Baby < 6 lbs.  Baby became sleepy at the breast and mother states her nipples are becoming sore. Provided mother with coconut oil. Mother recently pumped 35 ml.  Encouraged mother to give to baby.  Mother requested purple nipple to give volume back to baby. Mother does not have DEBP a home. Faxed WIC pump loaner referral.  Reviewed engorgement care and monitoring voids/stools.     Maternal Data    Feeding Feeding Type: Breast Fed  LATCH Score Latch: Grasps breast easily, tongue down, lips flanged, rhythmical sucking.  Audible Swallowing: A few with stimulation  Type of Nipple: Everted at rest and after stimulation  Comfort (Breast/Nipple): Soft / non-tender  Hold (Positioning): No assistance needed to correctly position infant at breast.  LATCH Score: 9  Interventions Interventions: Breast feeding basics reviewed;Expressed milk;DEBP  Lactation Tools Discussed/Used Tools: Coconut oil Breast pump type: Double-Electric Breast Pump   Consult Status Consult Status: Complete Date: 12/11/19    Dahlia Byes [facility] 12/11/2019, 10:03 AM

## 2019-12-14 ENCOUNTER — Other Ambulatory Visit: Payer: Self-pay | Admitting: Medical

## 2019-12-14 MED ORDER — OXYCODONE HCL 5 MG PO TABS: 5.0000 mg | ORAL_TABLET | Freq: Four times a day (QID) | ORAL | 0 refills | Status: DC | PRN

## 2019-12-16 ENCOUNTER — Other Ambulatory Visit: Payer: Self-pay

## 2019-12-16 ENCOUNTER — Ambulatory Visit (INDEPENDENT_AMBULATORY_CARE_PROVIDER_SITE_OTHER): Payer: Self-pay

## 2019-12-16 VITALS — BP 119/74 | HR 87 | Wt 166.3 lb

## 2019-12-16 DIAGNOSIS — Z5189 Encounter for other specified aftercare: Secondary | ICD-10-CM

## 2019-12-16 NOTE — Progress Notes (Signed)
Pt here today for wound check following repeat c-section on 12/08/19. Steristrips and honeycomb dressing soiled and intact on assessment; removed. Incision is clean and dry. Small open area to far right of incision. Three steri strips applied to area. Reviewed good wound care and s/s of infection with pt.   Pt reports some pain to the area. Encouraged good pain management; instructed pt to add ibuprofen, already taking Tylenol. Encouraged pt to use oxycodone if rotating Tylenol and ibuprofen is not controlling pain. Reviewed medications with pt. Pt unsure if she has active iron rx. Encouraged pt to call pharmacy to verify iron supplement ready for pickup. Explained this is important due to low hemoglobin. Instructed pt to take daily with orange juice if able. Explained pain medicine and iron can both cause constipation. Discussed need for good bowel regimen if BM does not continue to be regular. Pt denies any other issues. Offered lactation services as needed. Pt to follow up with office as needed prior to PP appt on 01/11/20.  Fleet Contras RN 12/16/19

## 2019-12-17 ENCOUNTER — Encounter: Payer: Self-pay | Admitting: Obstetrics and Gynecology

## 2020-01-11 ENCOUNTER — Encounter: Payer: Self-pay | Admitting: Certified Nurse Midwife

## 2020-01-11 ENCOUNTER — Ambulatory Visit (INDEPENDENT_AMBULATORY_CARE_PROVIDER_SITE_OTHER): Payer: Self-pay | Admitting: Certified Nurse Midwife

## 2020-01-11 ENCOUNTER — Other Ambulatory Visit: Payer: Self-pay

## 2020-01-11 DIAGNOSIS — Z3009 Encounter for other general counseling and advice on contraception: Secondary | ICD-10-CM

## 2020-01-11 DIAGNOSIS — Z98891 History of uterine scar from previous surgery: Secondary | ICD-10-CM

## 2020-01-11 NOTE — Progress Notes (Signed)
Post Partum Visit Note  Carolyn Joseph is a 29 y.o. [phone] female who presents for a postpartum visit. She is 4 weeks postpartum following a repeat cesarean section.  I have fully reviewed the prenatal and intrapartum course. The delivery was at 38/4 gestational weeks.  Anesthesia: spinal. Postpartum course has been uncomplicated. Baby is doing well. Baby is feeding by breast. Bleeding staining only. Bowel function is normal. Bladder function is normal. Patient is not sexually active. Patient is unsure at this time which contraception method she wants. Postpartum depression screening: negative.    The pregnancy intention screening data noted above was reviewed. Potential methods of contraception were discussed. The patient elected to proceed with unknown/unsure at this time.  The following portions of the patient's history were reviewed and updated as appropriate: allergies, current medications, past medical history, past surgical history and problem list.  Review of Systems A comprehensive review of systems was negative.    Objective:  Blood pressure 122/71, pulse 79, height 5\' 5"  (1.651 m), weight 162 lb 11.2 oz (73.8 kg), last menstrual period 03/06/2019, currently breastfeeding.  General:  alert, cooperative and no distress   Breasts:  negative  Lungs: clear to auscultation bilaterally  Heart:  regular rate and rhythm  Abdomen: soft, non-tender; bowel sounds normal; no masses,  no organomegaly   Vulva:  not evaluated  Vagina: not evaluated  Cervix:  not evaluated  Corpus: not examined  Adnexa:  not evaluated  Rectal Exam: Not performed.        Assessment/Plan:  1. Postpartum care and examination - normal postpartum examination. Pap smear not done at today's visit.   2. Birth control counseling - Educated and discussed birth control options with patient in detail  - patient is considering paragard IUD- discussed with patient that IUD is not recommended with  bicornate uterus as is increased risk for misplacement  - Patient wants more long term method - has been on OCPs in past and kept missing pills  - Discussed Nexplanon and Depo with patient, patient is considering Nexplanon but wants to discuss with husband prior to deciding. Plans to call office to schedule appointment when she decides.   Plan:   Essential components of care per ACOG recommendations:  1.  Mood and well being: Patient with negative depression screening today. Reviewed local resources for support.  - Patient does not use tobacco. - hx of drug use? No   2. Infant care and feeding:  -Patient currently breastmilk feeding? Yes. discussed return to work and pumping. If needed, patient was provided letter for work to allow for every 2-3 hr pumping breaks, and to be granted a private location to express breastmilk and refrigerated area to store breastmilk. Reviewed importance of draining breast regularly to support lactation. -Social determinants of health (SDOH) reviewed in EPIC. No concerns  3. Sexuality, contraception and birth spacing - Patient does not want a pregnancy in the next year.  Desired family size is 2 children.  - Reviewed forms of contraception in tiered fashion. Patient is unsure of contraceptive method at this time - patient plans to call when she decides.  - Discussed birth spacing of 18 months  4. Sleep and fatigue -Encouraged family/partner/community support of 4 hrs of uninterrupted sleep to help with mood and fatigue  5. Physical Recovery  - Discussed patients delivery and complications - Patient has urinary incontinence? No   6.  Health Maintenance - Last pap smear done 05/25/19 and was normal with negative HPV.  Sharyon Cable, CNM Center for [employer], [facility]

## 2020-06-21 ENCOUNTER — Telehealth: Payer: Self-pay | Admitting: Lactation Services

## 2020-06-21 ENCOUNTER — Other Ambulatory Visit: Payer: Self-pay | Admitting: Lactation Services

## 2020-06-21 MED ORDER — LEVONORGESTREL 1.5 MG PO TABS: 1.5000 mg | ORAL_TABLET | Freq: Once | ORAL | 0 refills | Status: AC

## 2020-06-21 NOTE — Telephone Encounter (Signed)
Patient called and LM on nurse voicemail that she is in need of Plan B prescription to be sent to her Pharmacy.   Returned patients call. She reports unprotected intercourse about 30 hours ago. She reports she has no insurance and no job currently. She would like sent to [facility] on [address]. Prescription sent.

## 2020-06-21 NOTE — Progress Notes (Signed)
Opened in error

## 2020-07-28 ENCOUNTER — Other Ambulatory Visit: Payer: Self-pay

## 2020-07-28 ENCOUNTER — Ambulatory Visit (HOSPITAL_COMMUNITY)
Admission: EM | Admit: 2020-07-28 | Discharge: 2020-07-28 | Disposition: A | Discharge status: Home / Self Care | Payer: No Payment, Other | Attending: Psychiatry | Admitting: Psychiatry

## 2020-07-28 DIAGNOSIS — Z72820 Sleep deprivation: Secondary | ICD-10-CM | POA: Insufficient documentation

## 2020-07-28 DIAGNOSIS — F4323 Adjustment disorder with mixed anxiety and depressed mood: Secondary | ICD-10-CM

## 2020-07-28 NOTE — Progress Notes (Signed)
Patient presented as a walk-in seeking hlp for her anxiety, sleep disturbance, depression and lack of energy. Patient states that she has two small children that she has to care for and gets very little sleep, she never gets to leave the house. She states that she has some help with the children, but states that she is tired all the time.  Patient denies SI/HI/Psychosis.  Patient states that she feels like she may need medication management and therapy.

## 2020-07-28 NOTE — Discharge Summary (Signed)
Carolyn Joseph to be D/C'd home per MD order. Discussed with the patient and all questions fully answered. An After Visit Summary was printed and given to the patient. Patient escorted out, and D/C home via private auto.  [name]  07/28/2020 3:22 PM

## 2020-07-28 NOTE — ED Provider Notes (Signed)
Behavioral [facility] Urgent [facility] Medical Screening Exam  Patient Name: Carolyn Joseph MRN: [id number] Date of Evaluation: 07/28/20 Chief Complaint:   Diagnosis:  Final diagnoses:  Adjustment disorder with mixed anxiety and depressed mood    History of Present illness: Carolyn Joseph is a 30 y.o. female 7 mon post partum. Patient reports she presented due to feeling "anxious" for the past 2-3 months. Patient reports that she has not been sleeping well because she has to wake up almost every 2 hours due to her 6 month old daughter's crying. Patient reports that she is breastfeeding still, but would like to stop soon. Patient reports that he daughter is not feeding at night but wants to rest against her. Patient reports that she also stays home during the day to look after her 60 mon old as well. Patient reports that she wishes to work again and has not worked since her first pregnancy because she was recommended bedrest. Patient reports that she spends almost all her time with her children and even when she leaves them with their father while she goes out to get groceries she constantly finds herself thinking about them and if something will happen. Patient's mom helps when she can but has poor vision and patient husband helps, but patient reports she could use more assistance from him. Patient reports that she does not have many friends in the area and the ones that she has are in [place], so she may talk to them on the phone. Patient endorses some depressed mood, decreased energy, decreased appetite and frustration with her current position in life. Patient denies SI, HI, and AVH. Patient denies access to firearms.   FH: Father has schizophrenia. Psychiatric Specialty Exam  Presentation  General Appearance:Appropriate for Environment  Eye Contact:Fair  Speech:Clear and Coherent  Speech Volume:Normal  Handedness:-- (Defer)   Mood and Affect   Mood:Dysphoric  Affect:Appropriate   Thought Process  Thought Processes:Coherent  Descriptions of Associations:Intact  Orientation:Full (Time, Place and Person)  Thought Content:Logical    Hallucinations:None  Ideas of Reference:None  Suicidal Thoughts:No  Homicidal Thoughts:No   Sensorium  Memory:Immediate Good; Recent Good; Remote Good  Judgment:Fair  Insight:Good   Executive Functions  Concentration:Good  Attention Span:Good  Recall:Good  Fund of Knowledge:Good  Language:Good   Psychomotor Activity  Psychomotor Activity:Normal   Assets  Assets:Communication Skills; Desire for Improvement; Intimacy; Housing; Physical Health; Resilience; Social Support   Sleep  Sleep:Poor  Number of hours: No data recorded  No data recorded  Physical Exam: Physical Exam Vitals and nursing note reviewed.  Constitutional:      General: She is not in acute distress.    Appearance: She is well-developed.  HENT:     Head: Normocephalic and atraumatic.  Eyes:     Conjunctiva/sclera: Conjunctivae normal.  Cardiovascular:     Rate and Rhythm: Normal rate and regular rhythm.     Heart sounds: No murmur heard.   Pulmonary:     Effort: Pulmonary effort is normal. No respiratory distress.     Breath sounds: Normal breath sounds.  Abdominal:     Palpations: Abdomen is soft.     Tenderness: There is no abdominal tenderness.  Musculoskeletal:     Cervical back: Neck supple.  Skin:    General: Skin is warm and dry.  Neurological:     Mental Status: She is alert.    Review of Systems  Constitutional: Negative for chills and fever.  HENT: Negative for hearing loss.   Eyes: Negative for  blurred vision.  Respiratory: Negative for cough and wheezing.   Cardiovascular: Negative for chest pain.  Gastrointestinal: Negative for abdominal pain.  Neurological: Negative for dizziness.   Blood pressure 113/78, pulse 88, temperature (!) 97.3 F (36.3 C),  temperature source Temporal, resp. rate 16, SpO2 98 %, currently breastfeeding. There is no height or weight on file to calculate BMI.  Musculoskeletal: Strength & Muscle Tone: within normal limits Gait & Station: normal Patient leans: N/A   [facility] MSE Discharge Disposition for Follow up and Recommendations: Based on my evaluation the patient does not appear to have an emergency medical condition and can be discharged with resources and follow up care in outpatient services for Individual Therapy   Adjustment disorder Patient agrees that she could use therapy and also recognized that she should try to wean her youngest child as well as require more help from her husband.   PGY-1 Bobbye Morton, MD 07/28/2020, 3:20 PM

## 2020-07-28 NOTE — Discharge Instructions (Signed)
° °  Please come to [facility] (this facility) during walk in hours for appointment with psychiatrist for further medication management and for therapists for therapy.  ° ° Walk in hours are 8-11 AM Monday through Thursday for medication management.Child and adolescent psychiatrists are only available on Wednesdays and Thursdays during walk in hours.  °Therapy walk in hours are Monday-Wednesday 8 AM-1PM.   It is first come, first -serve; it is best to arrive by 7:00 AM.  ° °On Friday from 1 pm to 4 pm for therapy intake only. Please arrive by 12:00 pm as it is  first come, first -serve.   ° °When you arrive please go upstairs for your appointment. If you are unsure of where to go, inform the front desk that you are here for a walk in appointment and they will assist you with directions upstairs. ° °Address:  °[address], in [place], [address] °Ph: [phone]  ° °

## 2022-07-31 ENCOUNTER — Ambulatory Visit (INDEPENDENT_AMBULATORY_CARE_PROVIDER_SITE_OTHER): Payer: Self-pay

## 2022-07-31 DIAGNOSIS — Z3491 Encounter for supervision of normal pregnancy, unspecified, first trimester: Secondary | ICD-10-CM

## 2022-07-31 DIAGNOSIS — Z3201 Encounter for pregnancy test, result positive: Secondary | ICD-10-CM

## 2022-07-31 DIAGNOSIS — Z3200 Encounter for pregnancy test, result unknown: Secondary | ICD-10-CM

## 2022-07-31 LAB — POCT PREGNANCY, URINE: Preg Test, Ur: POSITIVE — AB

## 2022-07-31 NOTE — Progress Notes (Signed)
Possible Pregnancy  Here today to leave urine specimen for pregnancy confirmation. UPT in office today is positive. Called pt to review results. Pt reports first positive home UPT a week ago. Reviewed dating with patient:   LMP: 06/08/22 approx EDD: 03/15/23 7w 4d today  Scheduled for [place] on 08/14/22 to confirm dating. OB history reviewed. History of second trimester loss (19w). Cerclage last 2 pregnancies with full term delivery. Reviewed medications and allergies with patient. Reports "a little cramp" once a day. Reviewed availability of MAU for any worsening pain or vaginal bleeding. Recommended pt begin prenatal vitamin. Front office will schedule prenatal visits with MD for discussion of cerclage.  Marjo Bicker, RN 07/31/2022  2:03 PM

## 2022-08-13 ENCOUNTER — Encounter: Payer: Self-pay | Admitting: Family Medicine

## 2022-08-14 ENCOUNTER — Ambulatory Visit (INDEPENDENT_AMBULATORY_CARE_PROVIDER_SITE_OTHER): Payer: Self-pay | Admitting: Family Medicine

## 2022-08-14 ENCOUNTER — Ambulatory Visit (INDEPENDENT_AMBULATORY_CARE_PROVIDER_SITE_OTHER): Payer: Self-pay

## 2022-08-14 ENCOUNTER — Encounter: Payer: Self-pay | Admitting: Family Medicine

## 2022-08-14 DIAGNOSIS — O039 Complete or unspecified spontaneous abortion without complication: Secondary | ICD-10-CM

## 2022-08-14 DIAGNOSIS — Z3491 Encounter for supervision of normal pregnancy, unspecified, first trimester: Secondary | ICD-10-CM

## 2022-08-14 MED ORDER — IBUPROFEN 800 MG PO TABS: 800.0000 mg | ORAL_TABLET | Freq: Three times a day (TID) | ORAL | 0 refills | Status: AC

## 2022-08-14 MED ORDER — MISOPROSTOL 200 MCG PO TABS: 800.0000 ug | ORAL_TABLET | Freq: Once | ORAL | 1 refills | Status: DC

## 2022-08-14 NOTE — Progress Notes (Signed)
S: Pt presents for  pregnancy dating confirmation. UPT positive on 07/31/22. Pt had light bleeding episode about 2 weeks ago, some cramping as well.   O:  LMP 06/08/2022  US findings: CRL--1.18cm, no FHT.  A/P: Pregnancy failure per US findings.   Discussed expectant vs medications assisted management. She has no dx of bleeding disorders, infection, hemorrhage. Pt opted for medication assisted treatment of pregnancy failure.   She was given misoprostol per vagina with one refill. Ibuprofen prn for pain.  She will need to follow up for [place] in 7-14 days to ensure complete passage of pregnancy.   Precautions to present to MAU were given including excessive vaginal bleeding, dizziness, shortness of breath, chest pain, worsening sx, etc. Pt agreed and expressed understanding of POC.  Return in about 2 weeks (around 08/28/2022) for [place] for miscarriage follow up/confirmaton after treatment.   Myrtie Hawk, DO FMOB Fellow, Faculty practice [facility], Center for [facility] 08/14/22  12:36 PM

## 2022-08-15 ENCOUNTER — Encounter: Payer: Self-pay | Admitting: Family Medicine

## 2022-08-17 ENCOUNTER — Inpatient Hospital Stay (HOSPITAL_COMMUNITY)
Admission: AD | Admit: 2022-08-17 | Discharge: 2022-08-17 | Disposition: A | Discharge status: Home / Self Care | Payer: Self-pay | Attending: Obstetrics & Gynecology | Admitting: Obstetrics & Gynecology

## 2022-08-17 ENCOUNTER — Encounter (HOSPITAL_COMMUNITY): Payer: Self-pay | Admitting: *Deleted

## 2022-08-17 DIAGNOSIS — Z3A10 10 weeks gestation of pregnancy: Secondary | ICD-10-CM

## 2022-08-17 DIAGNOSIS — O039 Complete or unspecified spontaneous abortion without complication: Secondary | ICD-10-CM

## 2022-08-17 DIAGNOSIS — Z6790 Unspecified blood type, Rh positive: Secondary | ICD-10-CM | POA: Insufficient documentation

## 2022-08-17 NOTE — MAU Note (Signed)
.  Carolyn Joseph is a 32 y.o. at [redacted]w[redacted]d here in MAU reporting: she had some spotting last night but none today.  Denies any pain or cramping. . Had an U/S at the beginning of the week and was told there was no heartbeat and they would follow up in 2 weeks. Pt just wants to know if there is a heartbeat if not she will take the pills.  LMP:  Onset of complaint: last night Pain score: 0  Vitals:   08/17/22 1302  BP: 130/70  Pulse: 97  Resp: 18  Temp: 98.5 F (36.9 C)     FHT:n/a  Lab orders placed from triage:

## 2022-08-17 NOTE — MAU Provider Note (Signed)
Event Date/Time   First Provider Initiated Contact with Patient 08/17/22 1402      S Ms. Carolyn Joseph is a 32 y.o. [id number] patient who presents to MAU today with complaint of ***.   O BP 130/70   Pulse 97   Temp 98.5 F (36.9 C)   Resp 18   Ht 5\' 5"  (1.651 m)   Wt 83.9 kg   LMP 06/08/2022   BMI 30.79 kg/m  Physical Exam  A Medical screening exam {Blank single:19197::"complete","started"} @DX @  P Discharge from MAU in stable condition Patient given the option of transfer to [facility] for further evaluation or seek care in outpatient facility of choice *** List of options for follow-up given *** Warning signs for worsening condition that would warrant emergency follow-up discussed Patient may return to MAU as needed   Calvert Cantor, [place] 08/17/2022 2:03 PM

## 2022-08-28 ENCOUNTER — Other Ambulatory Visit: Payer: Self-pay

## 2022-08-28 ENCOUNTER — Encounter: Payer: Self-pay | Admitting: Advanced Practice Midwife

## 2022-08-29 NOTE — Progress Notes (Signed)
Opened in error. Pt arrived late.

## 2022-10-15 ENCOUNTER — Other Ambulatory Visit: Payer: Self-pay

## 2022-10-15 DIAGNOSIS — N6320 Unspecified lump in the left breast, unspecified quadrant: Secondary | ICD-10-CM

## 2022-10-15 DIAGNOSIS — N644 Mastodynia: Secondary | ICD-10-CM

## 2022-10-15 NOTE — Addendum Note (Signed)
Addended by: Narda Rutherford on: 10/15/2022 03:28 PM   Modules accepted: Orders

## 2022-10-25 ENCOUNTER — Ambulatory Visit: Payer: No Typology Code available for payment source

## 2022-10-25 ENCOUNTER — Ambulatory Visit: Payer: Self-pay | Admitting: Hematology and Oncology

## 2022-10-25 VITALS — BP 121/89 | Wt 187.0 lb

## 2022-10-25 DIAGNOSIS — N6320 Unspecified lump in the left breast, unspecified quadrant: Secondary | ICD-10-CM

## 2022-10-25 NOTE — Progress Notes (Signed)
Ms. Adonia Merlos-Portillo is a 32 y.o. female who presents to [facility] clinic today with complaint of left breast lump.    Pap Smear: Pap not smear completed today. Last Pap smear was 05/25/2019 and was normal. Per patient has no history of an abnormal Pap smear. Last Pap smear result is available in Epic. She is due for Pap now; however, she is on her period. We will schedule for Pap screening day at the end of the month.    Physical exam: Breasts Breasts symmetrical. No skin abnormalities bilateral breasts. No nipple retraction bilateral breasts. No nipple discharge bilateral breasts. No lymphadenopathy. No lumps palpated right bilateral breasts. Left breast with mobile lump noted at 6 o'clock.     Pelvic/Bimanual Pap is not indicated today    Smoking History: Patient has never smoked and was not referred to quit line.    Patient Navigation: Patient education provided. Access to services provided for patient through [facility] program. No interpreter provided. No transportation provided   Colorectal Cancer Screening: Per patient has never had colonoscopy completed No complaints today.    Breast and Cervical Cancer Risk Assessment: Patient does not have family history of breast cancer, known genetic mutations, or radiation treatment to the chest before age 67. Patient does not have history of cervical dysplasia, immunocompromised, or DES exposure in-utero.  Risk Assessment   No risk assessment data     A: B[facility]CP exam without pap smear Complaint of left breast lump noted on exam.   P: Referred patient to the [facility] for a screening mammogram. Appointment scheduled 10/25/2022.  Ilda Basset A, NP 10/25/2022 1:10 PM

## 2022-10-25 NOTE — Patient Instructions (Signed)
Taught Carolyn Joseph about self breast awareness and gave educational materials to take home. Patient did need a Pap smear today due to last Pap smear was in 05/25/2019 per patient. Patient will schedule Pap for end of this month. Let her know BCCCP will cover Pap smears every 5 years unless has a history of abnormal Pap smears. Referred patient to the [facility] for diagnostic mammogram. Appointment scheduled for 10/25/2022. Patient aware of appointment and will be there. Let patient know will follow up with her within the next couple weeks with results. [name] verbalized understanding.  Pascal Lux, NP 1:13 PM

## 2022-10-26 ENCOUNTER — Ambulatory Visit
Admission: RE | Admit: 2022-10-26 | Discharge: 2022-10-26 | Disposition: A | Discharge status: Home / Self Care | Payer: No Typology Code available for payment source | Source: Ambulatory Visit | Attending: Obstetrics and Gynecology | Admitting: Obstetrics and Gynecology

## 2022-10-26 ENCOUNTER — Other Ambulatory Visit: Payer: No Typology Code available for payment source

## 2022-10-26 DIAGNOSIS — N6320 Unspecified lump in the left breast, unspecified quadrant: Secondary | ICD-10-CM

## 2022-10-26 DIAGNOSIS — N644 Mastodynia: Secondary | ICD-10-CM

## 2022-11-21 ENCOUNTER — Ambulatory Visit: Payer: Self-pay

## 2023-01-03 ENCOUNTER — Ambulatory Visit: Payer: No Typology Code available for payment source

## 2023-04-22 ENCOUNTER — Other Ambulatory Visit: Payer: Self-pay

## 2023-04-22 ENCOUNTER — Emergency Department (HOSPITAL_COMMUNITY)
Admission: EM | Admit: 2023-04-22 | Discharge: 2023-04-23 | Discharge status: Against Medical Advice | Payer: No Typology Code available for payment source | Attending: Emergency Medicine | Admitting: Emergency Medicine

## 2023-04-22 ENCOUNTER — Encounter (HOSPITAL_COMMUNITY): Payer: Self-pay

## 2023-04-22 DIAGNOSIS — N898 Other specified noninflammatory disorders of vagina: Secondary | ICD-10-CM | POA: Insufficient documentation

## 2023-04-22 DIAGNOSIS — Z20822 Contact with and (suspected) exposure to covid-19: Secondary | ICD-10-CM | POA: Insufficient documentation

## 2023-04-22 DIAGNOSIS — R519 Headache, unspecified: Secondary | ICD-10-CM | POA: Insufficient documentation

## 2023-04-22 DIAGNOSIS — Z5321 Procedure and treatment not carried out due to patient leaving prior to being seen by health care provider: Secondary | ICD-10-CM | POA: Insufficient documentation

## 2023-04-22 DIAGNOSIS — J101 Influenza due to other identified influenza virus with other respiratory manifestations: Secondary | ICD-10-CM | POA: Insufficient documentation

## 2023-04-22 DIAGNOSIS — R21 Rash and other nonspecific skin eruption: Secondary | ICD-10-CM | POA: Insufficient documentation

## 2023-04-22 LAB — WET PREP, GENITAL
Clue Cells Wet Prep HPF POC: NONE SEEN
Sperm: NONE SEEN
Trich, Wet Prep: NONE SEEN
WBC, Wet Prep HPF POC: >=10 — AB (ref <10)
Yeast Wet Prep HPF POC: NONE SEEN

## 2023-04-22 LAB — URINALYSIS, ROUTINE W REFLEX MICROSCOPIC
Bilirubin Urine: NEGATIVE
Glucose, UA: NEGATIVE mg/dL
Hgb urine dipstick: NEGATIVE
Ketones, ur: NEGATIVE mg/dL
Leukocytes,Ua: NEGATIVE
Nitrite: NEGATIVE
Protein, ur: NEGATIVE mg/dL
Specific Gravity, Urine: 1.015 (ref 1.005–1.030)
pH: 7 (ref 5.0–8.0)

## 2023-04-22 LAB — RESP PANEL BY RT-PCR (RSV, FLU A&B, COVID)  RVPGX2
Influenza A by PCR: POSITIVE — AB
Influenza B by PCR: NEGATIVE
Resp Syncytial Virus by PCR: NEGATIVE
SARS Coronavirus 2 by RT PCR: NEGATIVE

## 2023-04-22 LAB — PREGNANCY, URINE: Preg Test, Ur: NEGATIVE

## 2023-04-22 NOTE — ED Triage Notes (Addendum)
Pt arrives with c/o rash on her left arm and bilateral thighs that started last night. Pt reports headache. Pt endorses recent viral sickness. Pt denies fevers. Pt also c/o vaginal itching and discharge.

## 2023-04-22 NOTE — ED Provider Triage Note (Signed)
Emergency Medicine Provider Triage Evaluation Note  Carolyn Joseph , a 33 y.o. female  was evaluated in triage.  Pt complains of URI/rash/poss vaginal infection.  Review of Systems  Positive: Vaginal discharge, rash on arms and legs, vaginal itching, HA, runny nose Negative: Fever, chills, CP, SHOB, abd pain, body aches  Physical Exam  BP 116/76   Pulse 90   Temp 98.5 F (36.9 C) (Oral)   Resp 15   Wt 81.6 kg   LMP 06/08/2022   SpO2 100%   BMI 29.95 kg/m  Gen:   Awake, no distress   Resp:  Normal effort  MSK:   Moves extremities without difficulty  Other:    Medical Decision Making  Medically screening exam initiated at 4:05 PM.  Appropriate orders placed.  Baya Joseph was informed that the remainder of the evaluation will be completed by another provider, this initial triage assessment does not replace that evaluation, and the importance of remaining in the ED until their evaluation is complete.  Labs ordered   Dolphus Jenny, PA-C 04/22/23 1607

## 2023-04-22 NOTE — ED Notes (Signed)
Pt was at panera and aasked if she had been called. So I moved her back to the waiting room lobby

## 2023-04-22 NOTE — ED Notes (Signed)
Called pt x3 no answer, check restrooms in lobby and triage, still no answer

## 2023-04-23 LAB — GC/CHLAMYDIA PROBE AMP (~~LOC~~) NOT AT ARMC
Chlamydia: NEGATIVE
Comment: NEGATIVE
Comment: NORMAL
Neisseria Gonorrhea: NEGATIVE

## 2023-04-23 NOTE — ED Notes (Signed)
Pt came out to the nursing station and informed this RN that she was ready to leave and not wait for the provider to see her. Pt encouraged to stay and receive full treatment. Pt seen leaving the department.

## 2023-04-23 NOTE — ED Provider Notes (Signed)
Patient eloped prior to evaluation.    Achille Rich, PA-C 04/23/23 1610    Glynn Octave, MD 04/23/23 [phone]

## 2023-06-09 ENCOUNTER — Inpatient Hospital Stay (HOSPITAL_COMMUNITY)
Admission: AD | Admit: 2023-06-09 | Discharge: 2023-06-09 | Disposition: A | Discharge status: Home / Self Care | Attending: Obstetrics and Gynecology | Admitting: Obstetrics and Gynecology

## 2023-06-09 ENCOUNTER — Inpatient Hospital Stay (HOSPITAL_COMMUNITY)

## 2023-06-09 ENCOUNTER — Encounter (HOSPITAL_COMMUNITY): Payer: Self-pay | Admitting: Obstetrics and Gynecology

## 2023-06-09 DIAGNOSIS — O26851 Spotting complicating pregnancy, first trimester: Secondary | ICD-10-CM | POA: Insufficient documentation

## 2023-06-09 DIAGNOSIS — O26859 Spotting complicating pregnancy, unspecified trimester: Secondary | ICD-10-CM

## 2023-06-09 DIAGNOSIS — O3400 Maternal care for unspecified congenital malformation of uterus, unspecified trimester: Secondary | ICD-10-CM

## 2023-06-09 DIAGNOSIS — Z3490 Encounter for supervision of normal pregnancy, unspecified, unspecified trimester: Secondary | ICD-10-CM

## 2023-06-09 DIAGNOSIS — O26899 Other specified pregnancy related conditions, unspecified trimester: Secondary | ICD-10-CM

## 2023-06-09 DIAGNOSIS — Q513 Bicornate uterus: Secondary | ICD-10-CM

## 2023-06-09 DIAGNOSIS — Z3A01 Less than 8 weeks gestation of pregnancy: Secondary | ICD-10-CM

## 2023-06-09 DIAGNOSIS — R109 Unspecified abdominal pain: Secondary | ICD-10-CM

## 2023-06-09 LAB — WET PREP, GENITAL
Sperm: NONE SEEN
Trich, Wet Prep: NONE SEEN
WBC, Wet Prep HPF POC: <10 (ref <10)
Yeast Wet Prep HPF POC: NONE SEEN

## 2023-06-09 LAB — CBC
HCT: 35.7 % — ABNORMAL LOW (ref 36.0–46.0)
Hemoglobin: 11.9 g/dL — ABNORMAL LOW (ref 12.0–15.0)
MCH: 26.5 pg (ref 26.0–34.0)
MCHC: 33.3 g/dL (ref 30.0–36.0)
MCV: 79.5 fL — ABNORMAL LOW (ref 80.0–100.0)
Platelets: 225 10*3/uL (ref 150–400)
RBC: 4.49 MIL/uL (ref 3.87–5.11)
RDW: 16 % — ABNORMAL HIGH (ref 11.5–15.5)
WBC: 8.4 10*3/uL (ref 4.0–10.5)
nRBC: 0 % (ref 0.0–0.2)

## 2023-06-09 LAB — URINALYSIS, ROUTINE W REFLEX MICROSCOPIC
Bilirubin Urine: NEGATIVE
Glucose, UA: NEGATIVE mg/dL
Hgb urine dipstick: NEGATIVE
Ketones, ur: 5 mg/dL — AB
Leukocytes,Ua: NEGATIVE
Nitrite: NEGATIVE
Protein, ur: NEGATIVE mg/dL
Specific Gravity, Urine: 1.029 (ref 1.005–1.030)
pH: 6 (ref 5.0–8.0)

## 2023-06-09 LAB — HIV ANTIBODY (ROUTINE TESTING W REFLEX): HIV Screen 4th Generation wRfx: NONREACTIVE

## 2023-06-09 LAB — HCG, QUANTITATIVE, PREGNANCY: hCG, Beta Chain, Quant, S: 47033 m[IU]/mL — ABNORMAL HIGH (ref <5)

## 2023-06-09 LAB — POCT PREGNANCY, URINE: Preg Test, Ur: POSITIVE — AB

## 2023-06-09 NOTE — MAU Provider Note (Signed)
 History     CSN: [id number]  Arrival date and time: 06/09/23 1215   Event Date/Time   First Provider Initiated Contact with Patient 06/09/23 1553      Chief Complaint  Patient presents with   Vaginal Bleeding   HPI Ms. Carolyn Joseph is a 33 y.o. year old [id number] female at [redacted]w[redacted]d weeks gestation who presents to MAU reporting VB that started 1 week prior. She reports t started out as light pink spotting, yesterday the bleeding increased slightly and turned red; she is not wearing a pad - only with wiping. She plans to receive [facility] with [facility]; next appt is 06/12/2023.   OB History     Gravida  6   Para  2   Term  2   Preterm  0   AB  3   Living  2      SAB  2   IAB  0   Ectopic  0   Multiple  0   Live Births  2           Past Medical History:  Diagnosis Date   Bicornate uterus    Hip fracture (HCC) 2015   History of cesarean delivery 05/25/2019   LTCS 3/20 at 37 weeks d/t breech and IUGR   History of incompetent cervix, currently pregnant 05/21/2019   Cerclage placed 06/16/19 by Dr Jolayne Panther   Ovarian cyst    Supervision of high risk pregnancy, antepartum 05/21/2019    Nursing Staff Provider Office Location  [facility] Dating  10 wk [place] Language   English Anatomy US  normal Flu Vaccine   Genetic Screen  NIPS: negative  AFP:  negative First Screen:  Quad:   TDaP vaccine    Hgb A1C or  GTT Early  Third trimester Nml 2 hr GTT Rhogam     LAB RESULTS  Feeding Plan  Breast  Blood Type --/--/O POS (03/17 0205)  Contraception  Antibody NEG (03/17 0205) Circumcision  Rubel    Past Surgical History:  Procedure Laterality Date   CERVICAL CERCLAGE N/A 01/20/2018   Procedure: CERCLAGE CERVICAL;  Surgeon: Hermina Staggers, MD;  [facility];  Service: Gynecology;  Laterality: N/A;   CERVICAL CERCLAGE N/A 06/16/2019   Procedure: CERCLAGE CERVICAL;  Surgeon: Catalina Antigua, MD;  Location: [facility];  Service: Gynecology;  Laterality: N/A;   CESAREAN  SECTION N/A 06/10/2018   Procedure: CESAREAN SECTION;  Surgeon: Adam Phenix, MD;  Location: [facility];  Service: Obstetrics;  Laterality: N/A;   CESAREAN SECTION  12/08/2019   Procedure: CESAREAN SECTION;  Surgeon: Malachy Chamber, MD;  Location: [facility];  Service: Obstetrics;;   HIP FRACTURE SURGERY Bilateral 2015    Family History  Problem Relation Age of Onset   Diabetes Mother    Hypertension Mother    Diabetes Maternal Grandmother    Asthma Neg Hx    Cancer Neg Hx    Heart disease Neg Hx    Stroke Neg Hx     Social History   Tobacco Use   Smoking status: Former    Types: Cigarettes   Smokeless tobacco: Never   Tobacco comments:    in high school  Vaping Use   Vaping status: Never Used  Substance Use Topics   Alcohol use: Not Currently   Drug use: Never    Allergies: No Known Allergies  Medications Prior to Admission  Medication Sig Dispense Refill Last Dose/Taking   acetaminophen (TYLENOL) 325  MG tablet Take 2 tablets (650 mg total) by mouth every 6 (six) hours. 30 tablet 0    Prenatal Vit-Fe Fumarate-FA (PRENATAL MULTIVITAMIN) TABS tablet Take 1 tablet by mouth daily at 12 noon.       Review of Systems  Constitutional: Negative.   HENT: Negative.    Eyes: Negative.   Respiratory: Negative.    Cardiovascular: Negative.   Gastrointestinal: Negative.   Endocrine: Negative.   Genitourinary:  Positive for vaginal bleeding (light, pink spotting).  Musculoskeletal: Negative.   Skin: Negative.   Allergic/Immunologic: Negative.   Neurological: Negative.   Hematological: Negative.   Psychiatric/Behavioral: Negative.     Physical Exam   Blood pressure 128/69, pulse 100, temperature 98.6 F (37 C), temperature source Oral, resp. rate 18, height 5\' 5"  (1.651 m), weight 86.5 kg, last menstrual period 04/08/2023, SpO2 100%.  Physical Exam Vitals and nursing note reviewed.  Constitutional:      Appearance: Normal appearance. She is obese.   Cardiovascular:     Rate and Rhythm: Normal rate.  Pulmonary:     Effort: Pulmonary effort is normal.  Genitourinary:    Comments: Swabs collected by patient using blind swab technique  Musculoskeletal:        General: Normal range of motion.  Neurological:     Mental Status: She is alert and oriented to person, place, and time.  Psychiatric:        Mood and Affect: Mood normal.        Behavior: Behavior normal.        Thought Content: Thought content normal.        Judgment: Judgment normal.    MAU Course  Procedures  MDM CCUA UPT CBC ABO/Rh HCG Wet Prep GC/CT -- Results pending  RPR -- Results pending  OB U/S < 14 wks TVUS  Results for orders placed or performed during the hospital encounter of 06/09/23 (from the past 24 hours)  Pregnancy, urine POC     Status: Abnormal   Collection Time: 06/09/23 12:33 PM  Result Value Ref Range   Preg Test, Ur POSITIVE (A) NEGATIVE  Urinalysis, Routine w reflex microscopic -Urine, Clean Catch     Status: Abnormal   Collection Time: 06/09/23  1:04 PM  Result Value Ref Range   Color, Urine YELLOW YELLOW   APPearance CLEAR CLEAR   Specific Gravity, Urine 1.029 1.005 - 1.030   pH 6.0 5.0 - 8.0   Glucose, UA NEGATIVE NEGATIVE mg/dL   Hgb urine dipstick NEGATIVE NEGATIVE   Bilirubin Urine NEGATIVE NEGATIVE   Ketones, ur 5 (A) NEGATIVE mg/dL   Protein, ur NEGATIVE NEGATIVE mg/dL   Nitrite NEGATIVE NEGATIVE   Leukocytes,Ua NEGATIVE NEGATIVE  Wet prep, genital     Status: Abnormal   Collection Time: 06/09/23  1:04 PM  Result Value Ref Range   Yeast Wet Prep HPF POC NONE SEEN NONE SEEN   Trich, Wet Prep NONE SEEN NONE SEEN   Clue Cells Wet Prep HPF POC PRESENT (A) NONE SEEN   WBC, Wet Prep HPF POC <10 <10   Sperm NONE SEEN   CBC     Status: Abnormal   Collection Time: 06/09/23  1:11 PM  Result Value Ref Range   WBC 8.4 4.0 - 10.5 K/uL   RBC 4.49 3.87 - 5.11 MIL/uL   Hemoglobin 11.9 (L) 12.0 - 15.0 g/dL   HCT 78.4 (L) 69.6 -  46.0 %   MCV 79.5 (L) 80.0 - 100.0 fL   MCH  26.5 26.0 - 34.0 pg   MCHC 33.3 30.0 - 36.0 g/dL   RDW 40.9 (H) 81.1 - 91.4 %   Platelets 225 150 - 400 K/uL   nRBC 0.0 0.0 - 0.2 %  hCG, quantitative, pregnancy     Status: Abnormal   Collection Time: 06/09/23  1:11 PM  Result Value Ref Range   hCG, Beta Chain, Quant, S 47,033 (H) <5 mIU/mL  HIV Antibody (routine testing w rflx)     Status: None   Collection Time: 06/09/23  1:11 PM  Result Value Ref Range   HIV Screen 4th Generation wRfx Non Reactive Non Reactive    US OB LESS THAN 14 WEEKS WITH OB TRANSVAGINAL Result Date: 06/09/2023 CLINICAL DATA:  [id number] Vaginal bleeding affecting early pregnancy [id number] EXAM: OBSTETRIC <14 WK [place] AND TRANSVAGINAL OB US TECHNIQUE: Both transabdominal and transvaginal ultrasound examinations were performed for complete evaluation of the gestation as well as the maternal uterus, adnexal regions, and pelvic cul-de-sac. Transvaginal technique was performed to assess early pregnancy. COMPARISON:  None Available. FINDINGS: Intrauterine gestational sac: Single Yolk sac:  Visualized. Embryo:  Visualized. Cardiac Activity: Visualized. Heart Rate: 140 bpm CRL:  13.6 mm   7 w   5 d                  [place] EDC: 01/21/2024. Subchorionic hemorrhage:  None visualized. Maternal uterus/adnexae: Right ovary is visualized and appears within normal limits. Left ovary is not distinctly visualized. 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 The sac is in the left side of the uterus/left cornua is. Lower anterior uterine body C-section scar noted. IMPRESSION: *Single live intrauterine gestation with crown-rump length corresponding to 7 weeks 5 days. 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 Electronically Signed   By: Jules Schick M.D.   On: 06/09/2023 14:15     Assessment and Plan  1. Spotting and cramping affecting pregnancy, antepartum (Primary) - Patient stated that she "normally spots at 8 weeks  in her last pregnancy d/t bicornuate uterus"  2. [redacted] weeks gestation of pregnancy - Information provided on safe meds in pregnancy list given   - Discharge home  - Keep scheduled appt with [facility] on 06/12/2023 - Patient verbalized an understanding of the plan of care and agrees.    Raelyn Mora, CNM 06/09/2023, 3:53 PM

## 2023-06-09 NOTE — Discharge Instructions (Signed)

## 2023-06-09 NOTE — MAU Note (Addendum)
.  Carolyn Joseph is a 33 y.o. at Unknown here in MAU reporting: Vaginal bleeding that began one week ago. She reports her bleeding initially began as light pink spotting and yesterday slightly increased and turned red. Not wearing a pad. Only sees the blood when she wipes. She reports intermittent lower abdominal cramping that began last night.   Hx three losses. One at 19w. She reports she has had two cerclages in two previous pregnancies.  LMP: 04/07/2022 Onset of complaint: One week Pain score: 4/10 lower abdomen  Vitals:   06/09/23 1222  BP: 128/69  Pulse: 100  Resp: 18  Temp: 98.6 F (37 C)  SpO2: 100%     FHT: n/a Lab orders placed from triage: POCT Preg

## 2023-06-10 LAB — GC/CHLAMYDIA PROBE AMP (~~LOC~~) NOT AT ARMC
Chlamydia: NEGATIVE
Comment: NEGATIVE
Comment: NORMAL
Neisseria Gonorrhea: NEGATIVE

## 2023-06-12 ENCOUNTER — Ambulatory Visit: Payer: Self-pay

## 2023-07-04 ENCOUNTER — Telehealth (INDEPENDENT_AMBULATORY_CARE_PROVIDER_SITE_OTHER): Payer: Self-pay

## 2023-07-04 DIAGNOSIS — Z3A11 11 weeks gestation of pregnancy: Secondary | ICD-10-CM

## 2023-07-04 DIAGNOSIS — Z3491 Encounter for supervision of normal pregnancy, unspecified, first trimester: Secondary | ICD-10-CM

## 2023-07-04 DIAGNOSIS — O0990 Supervision of high risk pregnancy, unspecified, unspecified trimester: Secondary | ICD-10-CM | POA: Insufficient documentation

## 2023-07-04 DIAGNOSIS — Z3490 Encounter for supervision of normal pregnancy, unspecified, unspecified trimester: Secondary | ICD-10-CM

## 2023-07-04 NOTE — Patient Instructions (Signed)

## 2023-07-04 NOTE — Progress Notes (Signed)
 New OB Intake  I connected with Carolyn Joseph  on 07/04/23 at  8:15 AM EDT by MyChart Video Visit and verified that I am speaking with the correct person using two identifiers. Nurse is located at [facility] and pt is located at home.  I discussed the limitations, risks, security and privacy concerns of performing an evaluation and management service by telephone and the availability of in person appointments. I also discussed with the patient that there may be a patient responsible charge related to this service. The patient expressed understanding and agreed to proceed.  I explained I am completing New OB Intake today. We discussed EDD of 01/21/2024, by Ultrasound. Pt is [id number]. I reviewed her allergies, medications and Medical/Surgical/OB history.    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 Discussed the nature of our practice with multiple providers including residents and students. Due to the size of the practice, the delivering provider may not be the same as those providing prenatal care.   Patient is not interested in water birth. Offered upcoming OB visit with CNM to discuss further.  MyChart/Babyscripts MyChart access verified. I explained pt will have some visits in office and some virtually. Babyscripts instructions given and order placed. Patient verifies receipt of registration text/e-mail. Account successfully created and app downloaded. If patient is a candidate for Optimized scheduling, add to sticky note.   Blood Pressure Cuff/Weight Scale Patient is self-pay; explained patient will be given BP cuff at first prenatal appt. Explained after first prenatal appt pt will check weekly and document in  Babyscripts.  Anatomy US Explained first scheduled [place] will be around 19 weeks. Anatomy US scheduled for 6 /08/2023 at 1:00pm.  Is patient a CenteringPregnancy candidate?  Declined Declined due to Childcare    Is patient a Mom+Baby Combined Care candidate?  Not a candidate   If accepted, confirm patient does not intend to move from the area for at least 12 months, then notify Mom+Baby staff  Interested in Bly? If yes, send referral and doula dot phrase.   Is patient a candidate for Babyscripts Optimization? No, due to  hx   First visit review I reviewed new OB appt with patient. Explained pt will be seen by Dr. Alvester Morin at first visit. Discussed [name] genetic screening with patient. Panorama and Horizon.. Routine prenatal labs is needed at NOB visit,  Last Pap Diagnosis  Date Value Ref Range Status  05/25/2019   Final   - Negative for intraepithelial lesion or malignancy (NILM)    Carolyn Joseph, CMA 07/04/2023  8:17 AM

## 2023-07-10 ENCOUNTER — Ambulatory Visit (INDEPENDENT_AMBULATORY_CARE_PROVIDER_SITE_OTHER): Payer: Self-pay | Admitting: Family Medicine

## 2023-07-10 ENCOUNTER — Other Ambulatory Visit (HOSPITAL_COMMUNITY)
Admission: RE | Admit: 2023-07-10 | Discharge: 2023-07-10 | Disposition: A | Discharge status: Home / Self Care | Source: Ambulatory Visit | Attending: Family Medicine | Admitting: Family Medicine

## 2023-07-10 ENCOUNTER — Other Ambulatory Visit: Payer: Self-pay

## 2023-07-10 VITALS — BP 114/68 | HR 89 | Wt 188.0 lb

## 2023-07-10 DIAGNOSIS — O0990 Supervision of high risk pregnancy, unspecified, unspecified trimester: Secondary | ICD-10-CM | POA: Insufficient documentation

## 2023-07-10 DIAGNOSIS — Z1332 Encounter for screening for maternal depression: Secondary | ICD-10-CM

## 2023-07-10 DIAGNOSIS — O09291 Supervision of pregnancy with other poor reproductive or obstetric history, first trimester: Secondary | ICD-10-CM

## 2023-07-10 DIAGNOSIS — Z9889 Other specified postprocedural states: Secondary | ICD-10-CM

## 2023-07-10 DIAGNOSIS — O0991 Supervision of high risk pregnancy, unspecified, first trimester: Secondary | ICD-10-CM

## 2023-07-10 DIAGNOSIS — Z3A12 12 weeks gestation of pregnancy: Secondary | ICD-10-CM

## 2023-07-10 DIAGNOSIS — O3401 Maternal care for unspecified congenital malformation of uterus, first trimester: Secondary | ICD-10-CM

## 2023-07-10 DIAGNOSIS — Z98891 History of uterine scar from previous surgery: Secondary | ICD-10-CM

## 2023-07-10 DIAGNOSIS — O09299 Supervision of pregnancy with other poor reproductive or obstetric history, unspecified trimester: Secondary | ICD-10-CM

## 2023-07-10 DIAGNOSIS — Q513 Bicornate uterus: Secondary | ICD-10-CM

## 2023-07-10 NOTE — Patient Instructions (Signed)

## 2023-07-10 NOTE — Progress Notes (Signed)
 INITIAL PRENATAL VISIT  Subjective:   Carolyn Joseph is being seen today for her first obstetrical visit.  This is a planned pregnancy. This is a desired pregnancy.  She is at [redacted]w[redacted]d gestation by [redacted]w[redacted]d US  Her obstetrical history is significant for  bicornuate uterus and cerclage x2 in prior pregnancies . Relationship with FOB: spouse, living together. Patient does intend to breast feed. Pregnancy history fully reviewed.  Patient reports no complaints.  Indications for ASA therapy (per uptodate) One of the following: Previous pregnancy with preeclampsia, especially early onset and with an adverse outcome No Multifetal gestation No Chronic hypertension No Type 1 or 2 diabetes mellitus No Chronic kidney disease No Autoimmune disease (antiphospholipid syndrome, systemic lupus erythematosus) No  Two or more of the following: Nulliparity No Obesity (body mass index >30 kg/m2) Yes Family history of preeclampsia in mother or sister No Age >=35 years No Sociodemographic characteristics (African American race, low socioeconomic level) No Personal risk factors (eg, previous pregnancy with low birth weight or small for gestational age infant, previous adverse pregnancy outcome [eg, stillbirth], interval >10 years between pregnancies) No  Indications for early GDM screening -- plan for HA1C   Review of Systems:   Review of Systems  Objective:    Obstetric History OB History  Gravida Para Term Preterm AB Living  6 2 2  0 3 2  SAB IAB Ectopic Multiple Live Births  2 0 0 0 2    # Outcome Date GA Lbr Len/2nd Weight Sex Type Anes PTL Lv  6 Current           5 AB 08/14/22 [redacted]w[redacted]d    SAB     4 Term 12/08/19 [redacted]w[redacted]d  6 lb 2.4 oz (2.79 kg) F CS-LTranv Spinal  LIV  3 Term 06/10/18 [redacted]w[redacted]d  4 lb 9.9 oz (2.095 kg) F CS-LTranv Spinal  LIV     Birth Comments: IUGR, c/s for breach  2 SAB 08/13/15 [redacted]w[redacted]d         1 SAB 01/12/14 [redacted]w[redacted]d      N FD    Past Medical History:  Diagnosis Date    Bicornate uterus    Hip fracture (HCC) 2015   History of cesarean delivery 05/25/2019   LTCS 3/20 at 37 weeks d/t breech and IUGR   History of incompetent cervix, currently pregnant 05/21/2019   Cerclage placed 06/16/19 by Dr Dodie Frees   Ovarian cyst    Supervision of high risk pregnancy, antepartum 05/21/2019    Nursing Staff Provider Office Location  [facility] Dating  10 wk US  Language   English Anatomy US   normal Flu Vaccine   Genetic Screen  NIPS: negative  AFP:  negative First Screen:  Quad:   TDaP vaccine    Hgb A1C or  GTT Early  Third trimester Nml 2 hr GTT Rhogam     LAB RESULTS  Feeding Plan  Breast  Blood Type --/--/O POS (03/17 0205)  Contraception  Antibody NEG (03/17 0205) Circumcision  Rubel    Past Surgical History:  Procedure Laterality Date   CERVICAL CERCLAGE N/A 01/20/2018   Procedure: CERCLAGE CERVICAL;  Surgeon: Othelia Blinks, MD;  [facility];  Service: Gynecology;  Laterality: N/A;   CERVICAL CERCLAGE N/A 06/16/2019   Procedure: CERCLAGE CERVICAL;  Surgeon: Verlyn Goad, MD;  Location: [facility];  Service: Gynecology;  Laterality: N/A;   CESAREAN SECTION N/A 06/10/2018   Procedure: CESAREAN SECTION;  Surgeon: Tresia Fruit, MD;  [facility]: [facility];  Service: Obstetrics;  Laterality: N/A;   CESAREAN SECTION  12/08/2019   Procedure: CESAREAN SECTION;  Surgeon: Worley Headings, MD;  Location: [facility];  Service: Obstetrics;;   HIP FRACTURE SURGERY Bilateral 2015    Current Outpatient Medications on File Prior to Visit  Medication Sig Dispense Refill   Prenatal Vit-Fe Fumarate-FA (PRENATAL MULTIVITAMIN) TABS tablet Take 1 tablet by mouth daily at 12 noon.     acetaminophen  (TYLENOL ) 325 MG tablet Take 2 tablets (650 mg total) by mouth every 6 (six) hours. (Patient not taking: Reported on 07/10/2023) 30 tablet 0   No current facility-administered medications on file prior to visit.    No Known Allergies  Social History:  reports that she quit  smoking about 10 years ago. Her smoking use included cigarettes. She has never used smokeless tobacco. She reports that she does not currently use alcohol. She reports that she does not use drugs.  Family History  Problem Relation Age of Onset   Diabetes Mother    Hypertension Mother    Diabetes Maternal Grandmother    Asthma Neg Hx    Cancer Neg Hx    Heart disease Neg Hx    Stroke Neg Hx     The following portions of the patient's history were reviewed and updated as appropriate: allergies, current medications, past family history, past medical history, past social history, past surgical history and problem list.  Review of Systems Review of Systems  Constitutional:  Negative for chills and fever.  HENT:  Negative for congestion and sore throat.   Eyes:  Negative for pain and visual disturbance.  Respiratory:  Negative for cough, chest tightness and shortness of breath.   Cardiovascular:  Negative for chest pain.  Gastrointestinal:  Negative for abdominal pain, diarrhea, nausea and vomiting.  Endocrine: Negative for cold intolerance and heat intolerance.  Genitourinary:  Negative for dysuria and flank pain.  Musculoskeletal:  Negative for back pain.  Skin:  Negative for rash.  Allergic/Immunologic: Negative for food allergies.  Neurological:  Negative for dizziness and light-headedness.  Psychiatric/Behavioral:  Negative for agitation.       Physical Exam:  BP 114/68   Pulse 89   Wt 188 lb (85.3 kg)   LMP 04/08/2023 (Exact Date)   BMI 31.28 kg/m  CONSTITUTIONAL: Well-developed, well-nourished female in no acute distress.  HENT:  Normocephalic, atraumatic.  Oropharynx is clear and moist EYES: Conjunctivae normal. No scleral icterus.  NECK: Normal range of motion, supple, no masses.  Normal thyroid.  SKIN: Skin is warm and dry. No rash noted. Not diaphoretic. No erythema. No pallor. MUSCULOSKELETAL: Normal range of motion. No tenderness.  No cyanosis, clubbing, or edema.    NEUROLOGIC: Alert and oriented to person, place, and time. Normal muscle tone coordination.  PSYCHIATRIC: Normal mood and affect. Normal behavior. Normal judgment and thought content. CARDIOVASCULAR: Normal heart rate noted, regular rhythm RESPIRATORY: Clear to auscultation bilaterally. Effort and breath sounds normal, no problems with respiration noted. BREASTS: Symmetric in size. No masses, skin changes, nipple drainage, or lymphadenopathy. ABDOMEN: Soft, normal bowel sounds, no distention noted.  No tenderness, rebound or guarding. Fundal ht: Just abov erim PELVIC: deferred declined pap.  Fetal Heart Rate (bpm): 160   Movement: Present       Assessment:    Pregnancy: [id number]  1. Supervision of high risk pregnancy, antepartum (Primary) - CBC/D/Plt+RPR+Rh+ABO+RubIgG... - Culture, OB Urine - Hemoglobin A1c - GC/Chlamydia probe amp ([place])not at [facility] PRENATAL TEST - Ambulatory  Referral For Surgery Scheduling  2. History of cervical incompetence in pregnancy, currently pregnant, first trimester - Ambulatory Referral For Surgery Scheduling  3. History of cesarean delivery Elective repeat CS  4. Uterus bicornis affecting pregnancy in first trimester     Plan:     Initial labs drawn. Prenatal vitamins. Problem list reviewed and updated. Reviewed in detail the nature of the practice with collaborative care between  Genetic screening discussed: NIPS  ordered. Role of ultrasound in pregnancy discussed; Anatomy US : ordered. Amniocentesis discussed: not indicated. Follow up in 4 weeks. Weight gain recommendations per IOM guidelines reviewed: underweight/BMI 18.5 or less > 28 - 40 lbs; normal weight/BMI 18.5 - 24.9 > 25 - 35 lbs; overweight/BMI 25 - 29.9 > 15 - 25 lbs; obese/BMI  30 or more > 11 - 20 lbs.  Discussed clinic routines, schedule of care and testing, genetic screening options, involvement of students and residents under the direct supervision of APPs  and doctors and presence of female providers. Pt verbalized understanding.   Abner Ables, MD 07/10/2023 3:23 PM

## 2023-07-10 NOTE — Progress Notes (Signed)
 Fina asix [employer] -- @ checkout  Contact [facility]?? -- # given  BCCCP -- last pap 05/2019 negative  2 c section prior - vbac? -thinking about it would like  Last 2021 c section  Short cervix US ??  -imcompetent cervix 2021, cerlclage placed 2021 2020/2021 in both pregnancy @ 14 weeks  Horiz neg 2021  Edd by US  Nausea - phenergan? Wants tubes tied would like to discuss  No insurance  Phone number [facility] given  06/06 1:30 anatomy US  Wants Pap smear PP [employer] bag

## 2023-07-11 ENCOUNTER — Telehealth: Payer: Self-pay

## 2023-07-11 LAB — CBC/D/PLT+RPR+RH+ABO+RUBIGG...
Antibody Screen: NEGATIVE
Basophils Absolute: 0 10*3/uL (ref 0.0–0.2)
Basos: 0 %
EOS (ABSOLUTE): 0.1 10*3/uL (ref 0.0–0.4)
Eos: 1 %
HCV Ab: NONREACTIVE
HIV Screen 4th Generation wRfx: NONREACTIVE
Hematocrit: 37.6 % (ref 34.0–46.6)
Hemoglobin: 12.2 g/dL (ref 11.1–15.9)
Hepatitis B Surface Ag: NEGATIVE
Immature Grans (Abs): 0 10*3/uL (ref 0.0–0.1)
Immature Granulocytes: 0 %
Lymphocytes Absolute: 1.9 10*3/uL (ref 0.7–3.1)
Lymphs: 23 %
MCH: 27 pg (ref 26.6–33.0)
MCHC: 32.4 g/dL (ref 31.5–35.7)
MCV: 83 fL (ref 79–97)
Monocytes Absolute: 0.6 10*3/uL (ref 0.1–0.9)
Monocytes: 7 %
Neutrophils Absolute: 5.6 10*3/uL (ref 1.4–7.0)
Neutrophils: 69 %
Platelets: 219 10*3/uL (ref 150–450)
RBC: 4.52 x10E6/uL (ref 3.77–5.28)
RDW: 16.5 % — ABNORMAL HIGH (ref 11.7–15.4)
RPR Ser Ql: NONREACTIVE
Rh Factor: POSITIVE
Rubella Antibodies, IGG: 2.24 {index} (ref >0.99)
WBC: 8.2 10*3/uL (ref 3.4–10.8)

## 2023-07-11 LAB — HEMOGLOBIN A1C
Est. average glucose Bld gHb Est-mCnc: 111 mg/dL
Hgb A1c MFr Bld: 5.5 % (ref 4.8–5.6)

## 2023-07-11 LAB — HCV INTERPRETATION

## 2023-07-11 NOTE — Telephone Encounter (Signed)
 I called patient to schedule emergency surgery on 07/16/23 @[facility] at 12:15 pm. I left a detailed voicemail asking patient to call me back to confirm her availability.[phone].

## 2023-07-12 ENCOUNTER — Encounter: Payer: Self-pay | Admitting: Family Medicine

## 2023-07-12 LAB — GC/CHLAMYDIA PROBE AMP (~~LOC~~) NOT AT ARMC
Chlamydia: NEGATIVE
Comment: NEGATIVE
Comment: NORMAL
Neisseria Gonorrhea: NEGATIVE

## 2023-07-12 LAB — CULTURE, OB URINE

## 2023-07-12 LAB — URINE CULTURE, OB REFLEX: Organism ID, Bacteria: NO GROWTH

## 2023-07-15 ENCOUNTER — Telehealth: Payer: Self-pay

## 2023-07-15 NOTE — Telephone Encounter (Signed)
 I called patient back to let her know she is scheduled for her emergency procedure on 07/16/23 at [facility]. Patient states she out of town and would like procedure moved to 07/23/23 at noon. Provided pre-op instructions and surgery details by phone and confirmed written details will be sent to patient's Mychart acct.

## 2023-07-17 LAB — PANORAMA PRENATAL TEST FULL PANEL:PANORAMA TEST PLUS 5 ADDITIONAL MICRODELETIONS: FETAL FRACTION: 5.3

## 2023-07-18 ENCOUNTER — Encounter: Payer: Self-pay | Admitting: Family Medicine

## 2023-07-18 ENCOUNTER — Encounter (HOSPITAL_COMMUNITY): Payer: Self-pay | Admitting: *Deleted

## 2023-07-18 ENCOUNTER — Telehealth (HOSPITAL_COMMUNITY): Payer: Self-pay | Admitting: *Deleted

## 2023-07-18 NOTE — Telephone Encounter (Signed)
 Preadmission screen Arrive 1000 on 4/29.  Nothing to eat p MN.  Clear liquids until arrival at 01000.  No medicine on day of procedure.  Questions answered and verbalized understanding

## 2023-07-22 ENCOUNTER — Encounter (HOSPITAL_COMMUNITY): Payer: Self-pay | Admitting: Obstetrics and Gynecology

## 2023-07-22 NOTE — Anesthesia Preprocedure Evaluation (Signed)
 Anesthesia Evaluation  Patient identified by MRN, date of birth, ID band Patient awake    Reviewed: Allergy & Precautions, NPO status , Patient's Chart, lab work & pertinent test results  Airway Mallampati: II       Dental no notable dental hx. (+) Teeth Intact, Dental Advisory Given   Pulmonary former smoker   Pulmonary exam normal breath sounds clear to auscultation       Cardiovascular negative cardio ROS Normal cardiovascular exam Rhythm:Regular Rate:Normal     Neuro/Psych negative neurological ROS  negative psych ROS   GI/Hepatic negative GI ROS, Neg liver ROS,,,  Endo/Other  negative endocrine ROS    Renal/GU negative Renal ROS  negative genitourinary   Musculoskeletal negative musculoskeletal ROS (+)    Abdominal   Peds  Hematology  (+) Blood dyscrasia, anemia   Anesthesia Other Findings   Reproductive/Obstetrics (+) Pregnancy Incompetent Cervix 15 weeks Hx/o previous C/Section x 2                              Anesthesia Physical Anesthesia Plan  ASA: 2  Anesthesia Plan: Spinal   Post-op Pain Management: Minimal or no pain anticipated   Induction: Intravenous  PONV Risk Score and Plan: 3 and Treatment may vary due to age or medical condition  Airway Management Planned: Natural Airway, Simple Face Mask and Nasal Cannula  Additional Equipment: None and Fetal Monitoring  Intra-op Plan:   Post-operative Plan:   Informed Consent: I have reviewed the patients History and Physical, chart, labs and discussed the procedure including the risks, benefits and alternatives for the proposed anesthesia with the patient or authorized representative who has indicated his/her understanding and acceptance.       Plan Discussed with: Anesthesiologist and CRNA  Anesthesia Plan Comments:          Anesthesia Quick Evaluation

## 2023-07-23 ENCOUNTER — Ambulatory Visit (HOSPITAL_COMMUNITY)
Admission: RE | Admit: 2023-07-23 | Discharge: 2023-07-23 | Disposition: A | Discharge status: Home / Self Care | Attending: Obstetrics and Gynecology | Admitting: Obstetrics and Gynecology

## 2023-07-23 ENCOUNTER — Other Ambulatory Visit: Payer: Self-pay

## 2023-07-23 ENCOUNTER — Ambulatory Visit (HOSPITAL_COMMUNITY): Payer: Self-pay | Admitting: Anesthesiology

## 2023-07-23 ENCOUNTER — Ambulatory Visit (HOSPITAL_BASED_OUTPATIENT_CLINIC_OR_DEPARTMENT_OTHER): Payer: Self-pay | Admitting: Anesthesiology

## 2023-07-23 ENCOUNTER — Encounter (HOSPITAL_COMMUNITY)
Admission: RE | Disposition: A | Discharge status: Home / Self Care | Payer: Self-pay | Source: Home / Self Care | Attending: Obstetrics and Gynecology

## 2023-07-23 ENCOUNTER — Encounter (HOSPITAL_COMMUNITY): Payer: Self-pay | Admitting: Obstetrics and Gynecology

## 2023-07-23 DIAGNOSIS — O99012 Anemia complicating pregnancy, second trimester: Secondary | ICD-10-CM | POA: Insufficient documentation

## 2023-07-23 DIAGNOSIS — O3432 Maternal care for cervical incompetence, second trimester: Secondary | ICD-10-CM

## 2023-07-23 DIAGNOSIS — O09292 Supervision of pregnancy with other poor reproductive or obstetric history, second trimester: Secondary | ICD-10-CM

## 2023-07-23 DIAGNOSIS — Z3A14 14 weeks gestation of pregnancy: Secondary | ICD-10-CM

## 2023-07-23 DIAGNOSIS — Z87891 Personal history of nicotine dependence: Secondary | ICD-10-CM | POA: Insufficient documentation

## 2023-07-23 DIAGNOSIS — Z98891 History of uterine scar from previous surgery: Secondary | ICD-10-CM

## 2023-07-23 DIAGNOSIS — O09299 Supervision of pregnancy with other poor reproductive or obstetric history, unspecified trimester: Secondary | ICD-10-CM

## 2023-07-23 DIAGNOSIS — O0990 Supervision of high risk pregnancy, unspecified, unspecified trimester: Secondary | ICD-10-CM

## 2023-07-23 HISTORY — PX: CERVICAL CERCLAGE: SHX1329

## 2023-07-23 LAB — CBC
HCT: 36.4 % (ref 36.0–46.0)
Hemoglobin: 11.8 g/dL — ABNORMAL LOW (ref 12.0–15.0)
MCH: 26.8 pg (ref 26.0–34.0)
MCHC: 32.4 g/dL (ref 30.0–36.0)
MCV: 82.7 fL (ref 80.0–100.0)
Platelets: 210 10*3/uL (ref 150–400)
RBC: 4.4 MIL/uL (ref 3.87–5.11)
RDW: 16.8 % — ABNORMAL HIGH (ref 11.5–15.5)
WBC: 8.4 10*3/uL (ref 4.0–10.5)
nRBC: 0 % (ref 0.0–0.2)

## 2023-07-23 LAB — TYPE AND SCREEN
ABO/RH(D): O POS
Antibody Screen: NEGATIVE

## 2023-07-23 SURGERY — CERCLAGE, CERVIX, VAGINAL APPROACH
Anesthesia: Spinal

## 2023-07-23 MED ORDER — KETOROLAC TROMETHAMINE 30 MG/ML IJ SOLN: 30.0000 mg | Freq: Four times a day (QID) | INTRAMUSCULAR | Status: DC | PRN

## 2023-07-23 MED ORDER — BUPIVACAINE IN DEXTROSE 0.75-8.25 % IT SOLN
INTRATHECAL | Status: DC | PRN
  Administered 2023-07-23: 1.2 mL via INTRATHECAL

## 2023-07-23 MED ORDER — LACTATED RINGERS IV SOLN: INTRAVENOUS | Status: DC

## 2023-07-23 MED ORDER — STERILE WATER FOR IRRIGATION IR SOLN
Status: DC | PRN
  Administered 2023-07-23: 1

## 2023-07-23 MED ORDER — PHENYLEPHRINE 80 MCG/ML (10ML) SYRINGE FOR IV PUSH (FOR BLOOD PRESSURE SUPPORT)
PREFILLED_SYRINGE | INTRAVENOUS | Status: AC
  Filled 2023-07-23: qty 10

## 2023-07-23 MED ORDER — SODIUM CHLORIDE 0.9% FLUSH: 3.0000 mL | INTRAVENOUS | Status: DC | PRN

## 2023-07-23 MED ORDER — DIPHENHYDRAMINE HCL 25 MG PO CAPS: 25.0000 mg | ORAL_CAPSULE | ORAL | Status: DC | PRN

## 2023-07-23 MED ORDER — NALOXONE HCL 0.4 MG/ML IJ SOLN: 0.4000 mg | INTRAMUSCULAR | Status: DC | PRN

## 2023-07-23 MED ORDER — FENTANYL CITRATE (PF) 100 MCG/2ML IJ SOLN
25.0000 ug | INTRAMUSCULAR | Status: DC | PRN
Start: 2023-07-23 — End: 2023-07-23

## 2023-07-23 MED ORDER — POVIDONE-IODINE 10 % EX SWAB: 2.0000 | Freq: Once | CUTANEOUS | Status: DC

## 2023-07-23 MED ORDER — DIPHENHYDRAMINE HCL 50 MG/ML IJ SOLN: 12.5000 mg | INTRAMUSCULAR | Status: DC | PRN

## 2023-07-23 MED ORDER — PHENYLEPHRINE HCL (PRESSORS) 10 MG/ML IV SOLN
INTRAVENOUS | Status: DC | PRN
  Administered 2023-07-23 (×2): 160 ug via INTRAVENOUS

## 2023-07-23 MED ORDER — ONDANSETRON HCL 4 MG/2ML IJ SOLN
INTRAMUSCULAR | Status: DC | PRN
  Administered 2023-07-23: 4 mg via INTRAVENOUS

## 2023-07-23 MED ORDER — ONDANSETRON HCL 4 MG/2ML IJ SOLN: 4.0000 mg | Freq: Three times a day (TID) | INTRAMUSCULAR | Status: DC | PRN

## 2023-07-23 MED ORDER — LIDOCAINE HCL 1 % IJ SOLN
INTRAMUSCULAR | Status: DC | PRN
  Administered 2023-07-23: 10 mL

## 2023-07-23 MED ORDER — ONDANSETRON HCL 4 MG/2ML IJ SOLN
INTRAMUSCULAR | Status: AC
  Filled 2023-07-23: qty 2

## 2023-07-23 MED ORDER — LIDOCAINE HCL 1 % IJ SOLN
INTRAMUSCULAR | Status: AC
  Filled 2023-07-23: qty 20

## 2023-07-23 MED ORDER — NALOXONE HCL 4 MG/10ML IJ SOLN: 1.0000 ug/kg/h | INTRAVENOUS | Status: DC | PRN

## 2023-07-23 MED ORDER — ACETAMINOPHEN 500 MG PO TABS
1000.0000 mg | ORAL_TABLET | Freq: Once | ORAL | Status: AC
  Administered 2023-07-23: 1000 mg via ORAL

## 2023-07-23 MED ORDER — ACETAMINOPHEN 500 MG PO TABS
ORAL_TABLET | ORAL | Status: AC
  Filled 2023-07-23: qty 2

## 2023-07-23 MED ORDER — MEPERIDINE HCL 25 MG/ML IJ SOLN: 6.2500 mg | INTRAMUSCULAR | Status: DC | PRN

## 2023-07-23 SURGICAL SUPPLY — 14 items
CANISTER SUCT 3000ML PPV (MISCELLANEOUS) ×2 IMPLANT
GLOVE BIOGEL PI IND STRL 6.5 (GLOVE) ×1 IMPLANT
GLOVE BIOGEL PI IND STRL 7.0 (GLOVE) ×1 IMPLANT
GLOVE SURG SS PI 6.5 STRL IVOR (GLOVE) ×1 IMPLANT
GOWN STRL REUS W/TWL LRG LVL3 (GOWN DISPOSABLE) ×2 IMPLANT
NS IRRIG 1000ML POUR BTL (IV SOLUTION) ×1 IMPLANT
PACK VAGINAL MINOR WOMEN LF (CUSTOM PROCEDURE TRAY) ×1 IMPLANT
PAD OB MATERNITY 4.3X12.25 (PERSONAL CARE ITEMS) ×1 IMPLANT
PAD PREP 24X48 CUFFED NSTRL (MISCELLANEOUS) ×1 IMPLANT
SUT PROLENE 0 CT 1 30 (SUTURE) ×1 IMPLANT
TOWEL OR 17X24 6PK STRL BLUE (TOWEL DISPOSABLE) ×2 IMPLANT
TRAY FOLEY W/BAG SLVR 14FR (SET/KITS/TRAYS/PACK) ×1 IMPLANT
TUBING NON-CON 1/4 X 20 CONN (TUBING) ×1 IMPLANT
YANKAUER SUCT BULB TIP NO VENT (SUCTIONS) ×1 IMPLANT

## 2023-07-23 NOTE — Anesthesia Procedure Notes (Signed)
 Spinal  Patient location during procedure: OR Start time: 07/23/2023 11:55 AM End time: 07/23/2023 11:59 AM Reason for block: surgical anesthesia Staffing Performed: anesthesiologist  Anesthesiologist: Tura Gaines, MD Performed by: Tura Gaines, MD Authorized by: Tura Gaines, MD   Preanesthetic Checklist Completed: patient identified, IV checked, site marked, risks and benefits discussed, surgical consent, monitors and equipment checked, pre-op evaluation and timeout performed Spinal Block Patient position: sitting Prep: DuraPrep and site prepped and draped Patient monitoring: heart rate, cardiac monitor, continuous pulse ox and blood pressure Approach: midline Location: L3-4 Injection technique: single-shot Needle Needle type: Pencan  Needle gauge: 24 G Needle length: 9 cm Needle insertion depth: 6 cm Assessment Sensory level: T4 Events: CSF return Additional Notes Patient tolerated procedure well. Adequate sensory level.

## 2023-07-23 NOTE — Op Note (Signed)
 Carolyn Joseph   PROCEDURE DATE: 07/23/2023  PREOPERATIVE DIAGNOSIS: Intrauterine pregnancy at [redacted]w[redacted]d, history of cervical incompetence   POSTOPERATIVE DIAGNOSIS: The same PROCEDURE: Transvaginal McDonald Cervical Cerclage Placement SURGEON:  Dr. Verlyn Goad  INDICATIONS: 32 y.o. [id number] at [redacted]w[redacted]d with history of cervical incompetence, here for cerclage placement.   The risks of surgery were discussed in detail with the patient including but not limited to: bleeding; infection which may require antibiotic therapy; injury to cervix, vagina other surrounding organs; risk of ruptured membranes and/or preterm delivery and other postoperative or anesthesia complications.  Written informed consent was obtained.    FINDINGS:  About 2 cm palpable cervical length in the vagina, closed cervix, suture knot placed anteriorly.  ANESTHESIA:  Spinal INTRAVENOUS FLUIDS: 2000  ml ESTIMATED BLOOD LOSS: 14 ml COMPLICATIONS: None immediate  PROCEDURE IN DETAIL:  The patient received intravenous antibiotics and had sequential compression devices applied to her lower extremities while in the preoperative area.  Reassuring fetal heart rate was also obtained using a doppler. She was then taken to the operating room where spinal anesthesia was administered and was found to be adequate.  She was placed in the dorsal lithotomy, and was prepped and draped in a sterile manner. Her bladder was catheterized for an unmeasured amount of clear, yellow urine.  After an adequate timeout was performed, a vaginal speculum was then placed in the patient's vagina and a single tooth tenaculum was applied to the anterior lip of the cervix.  A paracervical block using 1% Marcaine  was administered.  The anterior and posterior lips of the cervix was grasped with ring forceps. A curved needle loaded with a number 1 Prolene suture was inserted at 12 o'clock, as high as possible at the junction of the rugated vaginal epithelium and the  smooth cervix, at least 2 cm above the external os.  Four bites are taken circumferentially around the entire cervix in a purse-string fashion, each bite should be deep enough to extend at least midway into the cervical stroma, but not into the endocervical canal. The two ends of the suture were then tied securely anteriorly and cut, leaving the ends long enough to grasp with a clamp when it is time to remove it. There was minimal bleeding noted and the ring forceps were removed with good hemostasis noted.  All instruments were removed from the patient's vagina.  Instrument, needle and sponge counts were correct x 2. The patient tolerated the procedure well, and was taken to the recovery area awake and in stable condition. Fetal movement and fetal hear rate visualized on ultrasound performed post procedure in the operating room.  Reassuring fetal heart rate was also obtained using a doppler in the recovery area.  The patient will be discharged to home as per PACU criteria.  Routine postoperative instructions given.

## 2023-07-23 NOTE — Transfer of Care (Signed)
 Immediate Anesthesia Transfer of Care Note  Patient: Carolyn Joseph  Procedure(s) Performed: CERCLAGE, CERVIX, VAGINAL APPROACH  Patient Location: PACU  Anesthesia Type:Spinal  Level of Consciousness: awake  Airway & Oxygen Therapy: Patient Spontanous Breathing  Post-op Assessment: Report given to RN and Post -op Vital signs reviewed and stable  Post vital signs: Reviewed and stable  Last Vitals:  Vitals Value Taken Time  BP    Temp    Pulse    Resp    SpO2      Last Pain:  Vitals:   07/23/23 1020  TempSrc: Oral      Patients Stated Pain Goal: 5 (07/23/23 1020)  Complications: No notable events documented.

## 2023-07-23 NOTE — H&P (Signed)
 Carolyn Joseph is a 33 y.o. female [id number] at [redacted]w[redacted]d presenting for scheduled cerclage placement. Patient with history of incompetent cervix and has had 2 cerclages with her previous pregnancies. Patient is doing well today and denies pelvic pain, cramping or vaginal bleeding. Patient with prenatal care at [facility] complicated by bicornuate uterus, history of cesarean section, prior pregnancy with fetal growth restrictions and history of incompetent cervix. . OB History     Gravida  6   Para  2   Term  2   Preterm  0   AB  3   Living  2      SAB  2   IAB  0   Ectopic  0   Multiple  0   Live Births  2          Past Medical History:  Diagnosis Date   Bicornate uterus    Hip fracture (HCC) 2015   History of cesarean delivery 05/25/2019   LTCS 3/20 at 37 weeks d/t breech and IUGR   History of incompetent cervix, currently pregnant 05/21/2019   Cerclage placed 06/16/19 by Dr Dodie Frees   Ovarian cyst    Supervision of high risk pregnancy, antepartum 05/21/2019    Nursing Staff Provider Office Location  [facility] Dating  10 wk US  Language   English Anatomy US   normal Flu Vaccine   Genetic Screen  NIPS: negative  AFP:  negative First Screen:  Quad:   TDaP vaccine    Hgb A1C or  GTT Early  Third trimester Nml 2 hr GTT Rhogam     LAB RESULTS  Feeding Plan  Breast  Blood Type --/--/O POS (03/17 0205)  Contraception  Antibody NEG (03/17 0205) Circumcision  Rubel   Past Surgical History:  Procedure Laterality Date   CERVICAL CERCLAGE N/A 01/20/2018   Procedure: CERCLAGE CERVICAL;  Surgeon: Othelia Blinks, MD;  Location: [facility];  Service: Gynecology;  Laterality: N/A;   CERVICAL CERCLAGE N/A 06/16/2019   Procedure: CERCLAGE CERVICAL;  Surgeon: Verlyn Goad, MD;  Location: [facility];  Service: Gynecology;  Laterality: N/A;   CESAREAN SECTION N/A 06/10/2018   Procedure: CESAREAN SECTION;  Surgeon: Tresia Fruit, MD;  Location: [facility];  Service: Obstetrics;   Laterality: N/A;   CESAREAN SECTION  12/08/2019   Procedure: CESAREAN SECTION;  Surgeon: Worley Headings, MD;  Location: [facility];  Service: Obstetrics;;   HIP FRACTURE SURGERY Bilateral 2015   Family History: family history includes Diabetes in her maternal grandmother and mother; Hypertension in her mother. Social History:  reports that she quit smoking about 10 years ago. Her smoking use included cigarettes. She has never used smokeless tobacco. She reports that she does not currently use alcohol. She reports that she does not use drugs.       Review of Systems See pertinent in HPI. All other systems reviewed and non contributory    Blood pressure 110/65, pulse 86, temperature 98.1 F (36.7 C), temperature source Oral, resp. rate 16, height 5\' 5"  (1.651 m), weight 81.6 kg, last menstrual period 04/08/2023, SpO2 99%. Exam Physical Exam  GENERAL: Well-developed, well-nourished female in no acute distress.  LUNGS: Clear to auscultation bilaterally.  HEART: Regular rate and rhythm. ABDOMEN: Soft, nontender, nondistended. No organomegaly. PELVIC: Deferred to OR EXTREMITIES: No cyanosis, clubbing, or edema, 2+ distal pulses.  Prenatal labs: ABO, Rh: --/--/PENDING (04/29 1033) Antibody: PENDING (04/29 1033) Rubella: 2.24 (04/16 1530) RPR: Non Reactive (04/16 1530)  HBsAg: Negative (04/16  1530)  HIV: Non Reactive (04/16 1530)  GBS:     Assessment/Plan: 33 yo [id number] at [redacted]w[redacted]d with history of incompetent cervix here for cervical cerclage placement - Risks, benefits and alternatives were explained including but not limited to risks of bleeding, infection, damage to adjacent organs and rupture of membrane. Patient verbalized understanding and all questions were answered   Carolyn Joseph 07/23/2023, 11:11 AM

## 2023-07-23 NOTE — Anesthesia Postprocedure Evaluation (Signed)
 Anesthesia Post Note  Patient: Carolyn Joseph  Procedure(s) Performed: CERCLAGE, CERVIX, VAGINAL APPROACH     Patient location during evaluation: PACU Anesthesia Type: Spinal Level of consciousness: oriented and awake and alert Pain management: pain level controlled Vital Signs Assessment: post-procedure vital signs reviewed and stable Respiratory status: spontaneous breathing, respiratory function stable and nonlabored ventilation Cardiovascular status: blood pressure returned to baseline and stable Postop Assessment: no headache, no backache, no apparent nausea or vomiting, spinal receding and patient able to bend at knees Anesthetic complications: no   No notable events documented.  Last Vitals:  Vitals:   07/23/23 1300 07/23/23 1315  BP: 113/61   Pulse: 87 90  Resp: 12 18  Temp:    SpO2: 100% 100%    Last Pain:  Vitals:   07/23/23 1020  TempSrc: Oral   Pain Goal: Patients Stated Pain Goal: 0 (07/23/23 1300)  LLE Motor Response: Purposeful movement (07/23/23 1300) LLE Sensation: Tingling (07/23/23 1300) RLE Motor Response: Purposeful movement (07/23/23 1300) RLE Sensation: Tingling (07/23/23 1300)        Shemaiah Round A.

## 2023-08-07 ENCOUNTER — Ambulatory Visit (INDEPENDENT_AMBULATORY_CARE_PROVIDER_SITE_OTHER): Payer: Self-pay | Admitting: Family Medicine

## 2023-08-07 ENCOUNTER — Other Ambulatory Visit: Payer: Self-pay

## 2023-08-07 ENCOUNTER — Other Ambulatory Visit (HOSPITAL_COMMUNITY)
Admission: RE | Admit: 2023-08-07 | Discharge: 2023-08-07 | Disposition: A | Discharge status: Home / Self Care | Source: Ambulatory Visit | Attending: Family Medicine | Admitting: Family Medicine

## 2023-08-07 VITALS — BP 129/71 | HR 108 | Wt 194.0 lb

## 2023-08-07 DIAGNOSIS — O0990 Supervision of high risk pregnancy, unspecified, unspecified trimester: Secondary | ICD-10-CM

## 2023-08-07 DIAGNOSIS — N898 Other specified noninflammatory disorders of vagina: Secondary | ICD-10-CM

## 2023-08-07 DIAGNOSIS — O09292 Supervision of pregnancy with other poor reproductive or obstetric history, second trimester: Secondary | ICD-10-CM

## 2023-08-07 DIAGNOSIS — Z9889 Other specified postprocedural states: Secondary | ICD-10-CM

## 2023-08-07 DIAGNOSIS — Z3009 Encounter for other general counseling and advice on contraception: Secondary | ICD-10-CM

## 2023-08-07 DIAGNOSIS — Z3A16 16 weeks gestation of pregnancy: Secondary | ICD-10-CM

## 2023-08-07 DIAGNOSIS — Z98891 History of uterine scar from previous surgery: Secondary | ICD-10-CM

## 2023-08-07 DIAGNOSIS — O09291 Supervision of pregnancy with other poor reproductive or obstetric history, first trimester: Secondary | ICD-10-CM

## 2023-08-07 DIAGNOSIS — O09299 Supervision of pregnancy with other poor reproductive or obstetric history, unspecified trimester: Secondary | ICD-10-CM | POA: Insufficient documentation

## 2023-08-07 DIAGNOSIS — O26892 Other specified pregnancy related conditions, second trimester: Secondary | ICD-10-CM

## 2023-08-07 DIAGNOSIS — O0992 Supervision of high risk pregnancy, unspecified, second trimester: Secondary | ICD-10-CM

## 2023-08-07 NOTE — Progress Notes (Signed)
   PRENATAL VISIT NOTE  Subjective:  Carolyn Joseph is a 33 y.o. [id number] at [redacted]w[redacted]d being seen today for ongoing prenatal care.  She is currently monitored for the following issues for this high-risk pregnancy and has Bicornate uterus complicating pregnancy; History of cerclage, currently pregnant; History of prior pregnancy with IUGR newborn; History of cesarean delivery; Intrauterine pregnancy; Supervision of high risk pregnancy, antepartum; and History of incompetent cervix, currently pregnant in second trimester on their problem list.  Patient reports no bleeding, no contractions, no cramping, and no leaking.  Contractions: Not present. Vag. Bleeding: None.  Movement: Absent. Denies leaking of fluid.   The following portions of the patient's history were reviewed and updated as appropriate: allergies, current medications, past family history, past medical history, past social history, past surgical history and problem list.   Objective:    Vitals:   08/07/23 1128  BP: 129/71  Pulse: (!) 108  Weight: 194 lb (88 kg)    Fetal Status:  Fetal Heart Rate (bpm): 159   Movement: Absent    General: Alert, oriented and cooperative. Patient is in no acute distress.  Skin: Skin is warm and dry. No rash noted.   Cardiovascular: Normal heart rate noted  Respiratory: Normal respiratory effort, no problems with respiration noted  Abdomen: Soft, gravid, appropriate for gestational age.  Pain/Pressure: Absent     Pelvic: Cervical exam deferred        Extremities: Normal range of motion.  Edema: None  Mental Status: Normal mood and affect. Normal behavior. Normal judgment and thought content.   Assessment and Plan:  Pregnancy: [id number] at [redacted]w[redacted]d 1. Supervision of high risk pregnancy, antepartum FHR and BP appropriate today  2. History of cervical incompetence in pregnancy, currently pregnant, first trimester Cerclage placed 4/29.  Doing well.  No bleeding.  Abnormal odor and discharge  today.  3. History of cesarean delivery Desires repeat C-section.  History of C-section x 2 for breech presentation  4. History of cerclage, currently pregnant Cerclage in place  5. [redacted] weeks gestation of pregnancy  6. Vaginal discharge during pregnancy in second trimester (Primary) Wet prep collected today  Unwanted fertility Desires BTL  Preterm labor symptoms and general obstetric precautions including but not limited to vaginal bleeding, contractions, leaking of fluid and fetal movement were reviewed in detail with the patient. Please refer to After Visit Summary for other counseling recommendations.   No follow-ups on file.  Future Appointments  Date Time Provider Department Center  08/30/2023  1:00 PM [facility] PROVIDER 1 [facility]  08/30/2023  1:30 PM [facility] US1 [facility]  09/05/2023 10:55 AM Felipe Horton, [place] , CNM [facility]    Ferdie Housekeeper, MD

## 2023-08-08 LAB — CERVICOVAGINAL ANCILLARY ONLY
Bacterial Vaginitis (gardnerella): NEGATIVE
Candida Glabrata: NEGATIVE
Candida Vaginitis: NEGATIVE
Chlamydia: NEGATIVE
Comment: NEGATIVE
Comment: NEGATIVE
Comment: NEGATIVE
Comment: NEGATIVE
Comment: NEGATIVE
Comment: NORMAL
Neisseria Gonorrhea: NEGATIVE
Trichomonas: NEGATIVE

## 2023-08-26 DIAGNOSIS — O3430 Maternal care for cervical incompetence, unspecified trimester: Secondary | ICD-10-CM | POA: Insufficient documentation

## 2023-08-28 ENCOUNTER — Ambulatory Visit: Payer: Self-pay

## 2023-08-30 ENCOUNTER — Other Ambulatory Visit: Payer: Self-pay | Admitting: *Deleted

## 2023-08-30 ENCOUNTER — Ambulatory Visit: Admitting: Obstetrics

## 2023-08-30 ENCOUNTER — Ambulatory Visit: Payer: Self-pay | Attending: Family Medicine

## 2023-08-30 VITALS — BP 122/66 | HR 93

## 2023-08-30 DIAGNOSIS — O3432 Maternal care for cervical incompetence, second trimester: Secondary | ICD-10-CM

## 2023-08-30 DIAGNOSIS — O43192 Other malformation of placenta, second trimester: Secondary | ICD-10-CM | POA: Insufficient documentation

## 2023-08-30 DIAGNOSIS — O0990 Supervision of high risk pregnancy, unspecified, unspecified trimester: Secondary | ICD-10-CM | POA: Insufficient documentation

## 2023-08-30 DIAGNOSIS — O365921 Maternal care for other known or suspected poor fetal growth, second trimester, fetus 1: Secondary | ICD-10-CM | POA: Insufficient documentation

## 2023-08-30 DIAGNOSIS — Z3A19 19 weeks gestation of pregnancy: Secondary | ICD-10-CM

## 2023-08-30 DIAGNOSIS — O99212 Obesity complicating pregnancy, second trimester: Secondary | ICD-10-CM | POA: Insufficient documentation

## 2023-08-30 DIAGNOSIS — Z98891 History of uterine scar from previous surgery: Secondary | ICD-10-CM | POA: Insufficient documentation

## 2023-08-30 DIAGNOSIS — Z3490 Encounter for supervision of normal pregnancy, unspecified, unspecified trimester: Secondary | ICD-10-CM

## 2023-08-30 DIAGNOSIS — O34219 Maternal care for unspecified type scar from previous cesarean delivery: Secondary | ICD-10-CM | POA: Insufficient documentation

## 2023-08-30 DIAGNOSIS — O09292 Supervision of pregnancy with other poor reproductive or obstetric history, second trimester: Secondary | ICD-10-CM

## 2023-08-30 DIAGNOSIS — Z362 Encounter for other antenatal screening follow-up: Secondary | ICD-10-CM

## 2023-08-30 DIAGNOSIS — Q513 Bicornate uterus: Secondary | ICD-10-CM | POA: Insufficient documentation

## 2023-08-30 DIAGNOSIS — O09299 Supervision of pregnancy with other poor reproductive or obstetric history, unspecified trimester: Secondary | ICD-10-CM

## 2023-08-30 DIAGNOSIS — Z9889 Other specified postprocedural states: Secondary | ICD-10-CM | POA: Insufficient documentation

## 2023-08-30 DIAGNOSIS — O3433 Maternal care for cervical incompetence, third trimester: Secondary | ICD-10-CM

## 2023-08-30 DIAGNOSIS — O34592 Maternal care for other abnormalities of gravid uterus, second trimester: Secondary | ICD-10-CM | POA: Insufficient documentation

## 2023-08-30 NOTE — Progress Notes (Signed)
 MFM Consult Note  Carolyn Joseph is currently at 19 weeks and 3 days.  She was seen due to a history of bicornuate uterus and history of cervical incompetence.  She currently has a cervical cerclage in place.  She has 2 prior cesarean deliveries due to breech presentation.  She denies any significant past medical history and denies any problems in her current pregnancy.    She had a cell free DNA test earlier in her pregnancy which indicated a low risk for trisomy 73, 79, and 13. A female fetus is predicted.   The overall EFW obtained today measured at the 9th percentile for her gestational age.  There was normal amniotic fluid noted.  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  The outflow tracts of the fetal heart appear within normal limits.  The patient was advised that a VSD may close spontaneously after birth.  However, many children may require surgery after birth should the VSD persist.   The association of a VSD with fetal chromosomal abnormalities was discussed.  Due to this finding, she was offered and declined an amniocentesis today for definitive diagnosis of fetal chromosomal abnormalities.  She is comfortable with the low risk indicated by her cell free DNA test.  The patient was informed that anomalies may be missed due to technical limitations. If the fetus is in a suboptimal position or maternal habitus is increased, visualization of the fetus in the maternal uterus may be impaired.  Due to the VSD noted today, she was referred to [facility] pediatric cardiology for a fetal echocardiogram.    The increased risk of IUGR due to her uterine anomaly was discussed.  The patient has a history of IUGR.  Her first child weighed only 4 pounds 9 ounces at term.  Due to her uterine anomaly and her history of IUGR, we will continue to follow her with growth ultrasounds throughout her pregnancy.    A follow-up exam was scheduled in 3 weeks.    The patient stated that all of  her questions were answered today.  A total of 45 minutes was spent counseling and coordinating the care for this patient.  Greater than 50% of the time was spent in direct face-to-face contact.

## 2023-09-04 NOTE — Progress Notes (Deleted)
   PRENATAL VISIT NOTE  Subjective:  Carolyn Joseph is a 33 y.o. [id number] at [redacted]w[redacted]d being seen today for ongoing prenatal care.  She is currently monitored for the following issues for this high-risk pregnancy and has Bicornate uterus complicating pregnancy; History of cerclage, currently pregnant; History of prior pregnancy with IUGR newborn; History of cesarean delivery; Intrauterine pregnancy; Supervision of high risk pregnancy, antepartum; History of incompetent cervix, currently pregnant in second trimester; and Cervical cerclage suture present-placed 07/23/23 on their problem list.   Patient reports {[id number]}.   .  .   . Denies leaking of fluid.   The following portions of the patient's history were reviewed and updated as appropriate: allergies, current medications, past family history, past medical history, past social history, past surgical history and problem list.   Objective:  There were no vitals filed for this visit.  Fetal Status:           General:  Alert, oriented and cooperative. Patient is in no acute distress.  Skin: Skin is warm and dry. No rash noted.   Cardiovascular: Normal heart rate noted  Respiratory: Normal respiratory effort, no problems with respiration noted  Abdomen: Soft, gravid, appropriate for gestational age.        Pelvic: {Blank single:[id number]::Cervical exam performed in the presence of a chaperone,Cervical exam deferred}        Extremities: Normal range of motion.     Mental Status: Normal mood and affect. Normal behavior. Normal judgment and thought content.   Assessment and Plan:  Pregnancy: [id number] at [redacted]w[redacted]d 1. Cervical cerclage suture present in second trimester (Primary)  2. History of prior pregnancy with IUGR newborn  3. History of cesarean delivery  4. Supervision of high risk pregnancy, antepartum  5. History of incompetent cervix, currently pregnant in second trimester  6. [redacted] weeks gestation of pregnancy  7. Uterus  bicornis affecting pregnancy in second trimester - ***AFP  Preterm labor symptoms and general obstetric precautions including but not limited to vaginal bleeding, contractions, leaking of fluid and fetal movement were reviewed in detail with the patient. Please refer to After Visit Summary for other counseling recommendations.   No follow-ups on file.  Future Appointments  Date Time Provider Department Center  09/05/2023 10:55 AM Carolyn Joseph , CNM [facility]    Carolyn Joseph  Carolyn Joseph [facility] for [employer]

## 2023-09-05 ENCOUNTER — Encounter: Payer: Self-pay | Admitting: Advanced Practice Midwife

## 2023-09-10 ENCOUNTER — Other Ambulatory Visit: Payer: Self-pay

## 2023-09-10 ENCOUNTER — Ambulatory Visit (INDEPENDENT_AMBULATORY_CARE_PROVIDER_SITE_OTHER): Payer: Self-pay | Admitting: Obstetrics and Gynecology

## 2023-09-10 VITALS — BP 119/66 | HR 103 | Wt 196.0 lb

## 2023-09-10 DIAGNOSIS — Z98891 History of uterine scar from previous surgery: Secondary | ICD-10-CM

## 2023-09-10 DIAGNOSIS — O0990 Supervision of high risk pregnancy, unspecified, unspecified trimester: Secondary | ICD-10-CM

## 2023-09-10 DIAGNOSIS — O365930 Maternal care for other known or suspected poor fetal growth, third trimester, not applicable or unspecified: Secondary | ICD-10-CM | POA: Insufficient documentation

## 2023-09-10 DIAGNOSIS — Q513 Bicornate uterus: Secondary | ICD-10-CM

## 2023-09-10 DIAGNOSIS — O3432 Maternal care for cervical incompetence, second trimester: Secondary | ICD-10-CM

## 2023-09-10 DIAGNOSIS — Z3A21 21 weeks gestation of pregnancy: Secondary | ICD-10-CM

## 2023-09-10 DIAGNOSIS — O3401 Maternal care for unspecified congenital malformation of uterus, first trimester: Secondary | ICD-10-CM

## 2023-09-10 DIAGNOSIS — O09291 Supervision of pregnancy with other poor reproductive or obstetric history, first trimester: Secondary | ICD-10-CM

## 2023-09-10 DIAGNOSIS — O34219 Maternal care for unspecified type scar from previous cesarean delivery: Secondary | ICD-10-CM

## 2023-09-10 DIAGNOSIS — Z3009 Encounter for other general counseling and advice on contraception: Secondary | ICD-10-CM

## 2023-09-10 DIAGNOSIS — O365920 Maternal care for other known or suspected poor fetal growth, second trimester, not applicable or unspecified: Secondary | ICD-10-CM

## 2023-09-10 NOTE — Progress Notes (Signed)
   PRENATAL VISIT NOTE  Subjective:  Carolyn Joseph is a 33 y.o. [id number] at [redacted]w[redacted]d being seen today for ongoing prenatal care.  She is currently monitored for the following issues for this high-risk pregnancy and has Bicornate uterus complicating pregnancy; History of cerclage, currently pregnant; History of prior pregnancy with IUGR newborn; History of cesarean delivery; Intrauterine pregnancy; Supervision of high risk pregnancy, antepartum; History of incompetent cervix, currently pregnant in second trimester; and Cervical cerclage suture present-placed 07/23/23 on their problem list.   Patient reports no complaints.  Contractions: Regular. Vag. Bleeding: None.  Movement: Present. Denies leaking of fluid.   The following portions of the patient's history were reviewed and updated as appropriate: allergies, current medications, past family history, past medical history, past social history, past surgical history and problem list.   Objective:   Vitals:   09/10/23 1008  BP: 119/66  Pulse: (!) 103  Weight: 196 lb (88.9 kg)    Fetal Status: Fetal Heart Rate (bpm): 151   Movement: Present     General:  Alert, oriented and cooperative. Patient is in no acute distress.  Skin: Skin is warm and dry. No rash noted.   Cardiovascular: Normal heart rate noted  Respiratory: Normal respiratory effort, no problems with respiration noted  Abdomen: Soft, gravid, appropriate for gestational age.  Pain/Pressure: Present     Pelvic: Cervical exam deferred        Extremities: Normal range of motion.  Edema: Trace  Mental Status: Normal mood and affect. Normal behavior. Normal judgment and thought content.   Assessment and Plan:  Pregnancy: [id number] at [redacted]w[redacted]d 1. Supervision of high risk pregnancy, antepartum (Primary) BP and FHR normal Doing well, feeling regular movement    2. Cervical cerclage suture present in second trimester Cerclage placed 4/29 No s&s  3. History of cervical  incompetence in pregnancy, currently pregnant, first trimester   4. History of cesarean delivery Desires RCS  5. Unwanted fertility Desires BTL   6. [redacted] weeks gestation of pregnancy   7. Uterus bicornis affecting pregnancy in first trimester   8. Poor fetal growth affecting management of mother in second trimester, single or unspecified fetus 6/6 EFW 9%, afi normal  Follow up u/s 6/27 VSD on u/s MFM referred to [facility] for fetal echo, awaiting call to schedule   Preterm labor symptoms and general obstetric precautions including but not limited to vaginal bleeding, contractions, leaking of fluid and fetal movement were reviewed in detail with the patient. Please refer to After Visit Summary for other counseling recommendations.   Return in 4 weeks for routine prenatal  Future Appointments  Date Time Provider Department Center  09/20/2023  7:00 AM [facility] PROVIDER 1 [facility]  09/20/2023  7:30 AM [facility] US5 [facility]  10/09/2023  3:15 PM Cresenzo, Mardee Shackle, MD [facility]    Susi Eric, FNP [facility] for [facility]

## 2023-09-20 ENCOUNTER — Ambulatory Visit: Payer: Self-pay

## 2023-09-20 ENCOUNTER — Ambulatory Visit

## 2023-09-20 DIAGNOSIS — Z3490 Encounter for supervision of normal pregnancy, unspecified, unspecified trimester: Secondary | ICD-10-CM

## 2023-09-20 DIAGNOSIS — O0990 Supervision of high risk pregnancy, unspecified, unspecified trimester: Secondary | ICD-10-CM

## 2023-09-24 ENCOUNTER — Ambulatory Visit

## 2023-09-24 ENCOUNTER — Ambulatory Visit: Payer: Self-pay

## 2023-09-25 ENCOUNTER — Other Ambulatory Visit

## 2023-09-25 ENCOUNTER — Inpatient Hospital Stay (HOSPITAL_COMMUNITY): Admission: RE | Admit: 2023-09-25 | Source: Ambulatory Visit

## 2023-10-02 ENCOUNTER — Ambulatory Visit: Payer: Self-pay | Attending: Obstetrics

## 2023-10-02 ENCOUNTER — Other Ambulatory Visit: Payer: Self-pay | Admitting: *Deleted

## 2023-10-02 ENCOUNTER — Ambulatory Visit: Payer: Self-pay

## 2023-10-02 VITALS — BP 123/50 | HR 97

## 2023-10-02 DIAGNOSIS — Z8759 Personal history of other complications of pregnancy, childbirth and the puerperium: Secondary | ICD-10-CM | POA: Insufficient documentation

## 2023-10-02 DIAGNOSIS — Z362 Encounter for other antenatal screening follow-up: Secondary | ICD-10-CM | POA: Insufficient documentation

## 2023-10-02 DIAGNOSIS — O09292 Supervision of pregnancy with other poor reproductive or obstetric history, second trimester: Secondary | ICD-10-CM | POA: Insufficient documentation

## 2023-10-02 DIAGNOSIS — Z3A24 24 weeks gestation of pregnancy: Secondary | ICD-10-CM

## 2023-10-02 DIAGNOSIS — Q513 Bicornate uterus: Secondary | ICD-10-CM

## 2023-10-02 DIAGNOSIS — O365920 Maternal care for other known or suspected poor fetal growth, second trimester, not applicable or unspecified: Secondary | ICD-10-CM

## 2023-10-02 DIAGNOSIS — Z3490 Encounter for supervision of normal pregnancy, unspecified, unspecified trimester: Secondary | ICD-10-CM

## 2023-10-02 DIAGNOSIS — E669 Obesity, unspecified: Secondary | ICD-10-CM

## 2023-10-02 DIAGNOSIS — O3432 Maternal care for cervical incompetence, second trimester: Secondary | ICD-10-CM | POA: Insufficient documentation

## 2023-10-02 DIAGNOSIS — O0990 Supervision of high risk pregnancy, unspecified, unspecified trimester: Secondary | ICD-10-CM

## 2023-10-02 DIAGNOSIS — O34219 Maternal care for unspecified type scar from previous cesarean delivery: Secondary | ICD-10-CM

## 2023-10-02 DIAGNOSIS — O34592 Maternal care for other abnormalities of gravid uterus, second trimester: Secondary | ICD-10-CM

## 2023-10-02 DIAGNOSIS — O43192 Other malformation of placenta, second trimester: Secondary | ICD-10-CM

## 2023-10-02 DIAGNOSIS — O99212 Obesity complicating pregnancy, second trimester: Secondary | ICD-10-CM

## 2023-10-02 NOTE — Progress Notes (Signed)
 MFM Consult Note  Carolyn Joseph is currently at 24 weeks and 1 day.  She has been followed due to a bicornuate uterus with a cerclage in place.    IUGR was also noted during her last ultrasound exam.  A VSD was also suspected in her fetus.  The patient reports that she was never contacted by pediatric cardiology for the fetal echocardiogram.    She has 2 prior cesarean deliveries due to breech presentation.  Her first child weighed only 4 pounds 9 ounces at term.  She denies any problems since her last exam.  The overall EFW obtained today of 1 pound 3 ounces measured at the 6th percentile for her gestational age.    There was normal amniotic fluid noted.  The fetus remains in the breech presentation.  Doppler studies of the umbilical arteries performed today showed a normal S/D ratio of 4.2.  There were no signs of absent or reversed end-diastolic flow.  A possible VSD continues to be suspected in the fetal heart. The outflow tracts of the fetal heart appear within normal limits.  She was given a referral to [facility] pediatric cardiology for a fetal echocardiogram to be performed as soon as possible.  She will return in 3 weeks for another growth scan and umbilical artery Doppler study.    The patient stated that all of her questions were answered today.  A total of 20 minutes was spent counseling and coordinating the care for this patient.  Greater than 50% of the time was spent in direct face-to-face contact.

## 2023-10-09 ENCOUNTER — Other Ambulatory Visit: Payer: Self-pay

## 2023-10-09 ENCOUNTER — Ambulatory Visit (INDEPENDENT_AMBULATORY_CARE_PROVIDER_SITE_OTHER): Payer: Self-pay | Admitting: Family Medicine

## 2023-10-09 VITALS — BP 108/68 | HR 87 | Wt 200.6 lb

## 2023-10-09 DIAGNOSIS — O09291 Supervision of pregnancy with other poor reproductive or obstetric history, first trimester: Secondary | ICD-10-CM

## 2023-10-09 DIAGNOSIS — Z3A25 25 weeks gestation of pregnancy: Secondary | ICD-10-CM

## 2023-10-09 DIAGNOSIS — O09292 Supervision of pregnancy with other poor reproductive or obstetric history, second trimester: Secondary | ICD-10-CM

## 2023-10-09 DIAGNOSIS — O0990 Supervision of high risk pregnancy, unspecified, unspecified trimester: Secondary | ICD-10-CM

## 2023-10-09 DIAGNOSIS — O3432 Maternal care for cervical incompetence, second trimester: Secondary | ICD-10-CM

## 2023-10-09 DIAGNOSIS — O34219 Maternal care for unspecified type scar from previous cesarean delivery: Secondary | ICD-10-CM

## 2023-10-09 DIAGNOSIS — Z3009 Encounter for other general counseling and advice on contraception: Secondary | ICD-10-CM

## 2023-10-09 DIAGNOSIS — Z98891 History of uterine scar from previous surgery: Secondary | ICD-10-CM

## 2023-10-10 ENCOUNTER — Telehealth: Payer: Self-pay

## 2023-10-10 NOTE — Progress Notes (Signed)
   PRENATAL VISIT NOTE  Subjective:  Carolyn Joseph is a 33 y.o. H3E7967 at [redacted]w[redacted]d being seen today for ongoing prenatal care.  She is currently monitored for the following issues for this high-risk pregnancy and has Bicornate uterus complicating pregnancy; History of cerclage, currently pregnant; History of prior pregnancy with IUGR newborn; History of cesarean delivery; Intrauterine pregnancy; Supervision of high risk pregnancy, antepartum; History of incompetent cervix, currently pregnant in second trimester; Cervical cerclage suture present-placed 07/23/23; and Poor fetal growth affecting management of mother in second trimester on their problem list.  Patient reports no bleeding, no contractions, no cramping, and no leaking.  Contractions: Irritability. Vag. Bleeding: None.  Movement: Present. Denies leaking of fluid.   The following portions of the patient's history were reviewed and updated as appropriate: allergies, current medications, past family history, past medical history, past social history, past surgical history and problem list.   Objective:    Vitals:   10/09/23 1549  BP: 108/68  Pulse: 87  Weight: 200 lb 9.6 oz (91 kg)    Fetal Status:  Fetal Heart Rate (bpm): 154   Movement: Present    General: Alert, oriented and cooperative. Patient is in no acute distress.  Skin: Skin is warm and dry. No rash noted.   Cardiovascular: Normal heart rate noted  Respiratory: Normal respiratory effort, no problems with respiration noted  Abdomen: Soft, gravid, appropriate for gestational age.  Pain/Pressure: Absent     Pelvic: Cervical exam deferred        Extremities: Normal range of motion.  Edema: None  Mental Status: Normal mood and affect. Normal behavior. Normal judgment and thought content.   Assessment and Plan:  Pregnancy: H3E7967 at [redacted]w[redacted]d 1. Supervision of high risk pregnancy, antepartum (Primary) FHR and BP appropriate today  2. Cervical cerclage suture  present in second trimester Denies any bleeding or pain.  3. History of cervical incompetence in pregnancy, currently pregnant, first trimester  4. History of cesarean delivery Desires repeat cesarean section  5. Unwanted fertility Desires tubal ligation.  Discussed signing papers today but she will sign papers at next visit.  6. [redacted] weeks gestation of pregnancy   Preterm labor symptoms and general obstetric precautions including but not limited to vaginal bleeding, contractions, leaking of fluid and fetal movement were reviewed in detail with the patient. Please refer to After Visit Summary for other counseling recommendations.   No follow-ups on file.  Future Appointments  Date Time Provider Department Center  10/25/2023  7:15 AM [facility] PROVIDER 1 [facility]  10/25/2023  7:30 AM [facility] US5 [facility]  11/01/2023  9:40 AM [facility] LAB [facility]  11/01/2023 10:55 AM Eveline Lynwood MATSU, MD [facility]  11/14/2023 10:35 AM Delores Nidia CROME, FNP [facility]  11/28/2023  3:15 PM Delores Nidia CROME, FNP [facility]  12/12/2023  4:15 PM Delores Nidia CROME, FNP [facility]    Norleen LULLA Rover, MD

## 2023-10-10 NOTE — Telephone Encounter (Signed)
 Called Pt to go over referral that was sent to [facility] on [address], phone# [phone]. Pt did not answer, left VM that they have been  trying to call her to schedule appointment but  no answer, they states referral will be good for 6 mnths. Letter sent from [facility] in [place] addressed to Dr. Ileana.

## 2023-10-25 ENCOUNTER — Ambulatory Visit: Payer: Self-pay

## 2023-10-25 ENCOUNTER — Ambulatory Visit: Payer: Self-pay | Attending: Obstetrics and Gynecology | Admitting: Obstetrics and Gynecology

## 2023-10-25 ENCOUNTER — Other Ambulatory Visit: Payer: Self-pay | Admitting: *Deleted

## 2023-10-25 VITALS — BP 118/58 | HR 79

## 2023-10-25 DIAGNOSIS — O365920 Maternal care for other known or suspected poor fetal growth, second trimester, not applicable or unspecified: Secondary | ICD-10-CM

## 2023-10-25 DIAGNOSIS — O09292 Supervision of pregnancy with other poor reproductive or obstetric history, second trimester: Secondary | ICD-10-CM | POA: Insufficient documentation

## 2023-10-25 DIAGNOSIS — O99212 Obesity complicating pregnancy, second trimester: Secondary | ICD-10-CM

## 2023-10-25 DIAGNOSIS — Z98891 History of uterine scar from previous surgery: Secondary | ICD-10-CM

## 2023-10-25 DIAGNOSIS — O34592 Maternal care for other abnormalities of gravid uterus, second trimester: Secondary | ICD-10-CM | POA: Insufficient documentation

## 2023-10-25 DIAGNOSIS — Q513 Bicornate uterus: Secondary | ICD-10-CM

## 2023-10-25 DIAGNOSIS — Z3490 Encounter for supervision of normal pregnancy, unspecified, unspecified trimester: Secondary | ICD-10-CM

## 2023-10-25 DIAGNOSIS — E669 Obesity, unspecified: Secondary | ICD-10-CM

## 2023-10-25 DIAGNOSIS — O43192 Other malformation of placenta, second trimester: Secondary | ICD-10-CM

## 2023-10-25 DIAGNOSIS — O3402 Maternal care for unspecified congenital malformation of uterus, second trimester: Secondary | ICD-10-CM

## 2023-10-25 DIAGNOSIS — O3432 Maternal care for cervical incompetence, second trimester: Secondary | ICD-10-CM | POA: Insufficient documentation

## 2023-10-25 DIAGNOSIS — O34219 Maternal care for unspecified type scar from previous cesarean delivery: Secondary | ICD-10-CM | POA: Insufficient documentation

## 2023-10-25 DIAGNOSIS — Z3A27 27 weeks gestation of pregnancy: Secondary | ICD-10-CM | POA: Insufficient documentation

## 2023-10-25 DIAGNOSIS — O402XX0 Polyhydramnios, second trimester, not applicable or unspecified: Secondary | ICD-10-CM

## 2023-10-25 DIAGNOSIS — O0990 Supervision of high risk pregnancy, unspecified, unspecified trimester: Secondary | ICD-10-CM

## 2023-10-25 DIAGNOSIS — Z8759 Personal history of other complications of pregnancy, childbirth and the puerperium: Secondary | ICD-10-CM

## 2023-10-25 NOTE — Progress Notes (Signed)
 Maternal-Fetal Medicine Consultation Name: Carolyn Joseph MRN: [id number]  G6 E7967 at 27w 3d gestation. - Fetal growth restriction.  On ultrasound performed 3 weeks ago, the estimated fetal weight was at the 6th percentile. - History of cerclage in this pregnancy at [redacted] weeks gestation. - History of 2 cesarean deliveries. - Marginal cord insertion. 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 - Bicornuate uterus.  Ultrasound On today's ultrasound, estimated fetal weight is at the 11th percentile and the abdominal circumference measurement at the 27 percentile.  Head circumference measurement is at the 1st percentile (normal).  Mild polyhydramnios is seen (maximum vertical pocket 8.7 cm).  Good fetal activity is present.  Placenta is anterior and there is no evidence of previa or placenta accreta spectrum. Umbilical artery Doppler showed normal for diastolic flow. Possibility of a small muscular ventricular septal defect cannot be ruled out.  Fetal growth restriction I reassured the patient of normal fetal growth assessment.  However, we recommend a follow-up growth assessment in 3 weeks to rule out fetal growth restriction.  Polyhydramnios I discussed the possible causes of polyhydramnios including gestational diabetes.  I encouraged her to screen for gestational diabetes.  In the absence of gestational diabetes, it is usually idiopathic (no known cause) and is associated with good pregnancy outcomes.  Previous 2 cesarean deliveries I reassured the patient of normal placental location.  Repeat cesarean deliveries increase the risks of placenta previa and/or placenta accreta spectrum.  Long-term complications include small bowel obstruction.  Patient is planning to have bilateral tubal sterilization with cesarean section.   Recommendations -An appointment was made for her to return in 3 weeks for fetal growth assessment.    Consultation including face-to-face  (more than 50%) counseling 20 minutes.

## 2023-10-29 ENCOUNTER — Other Ambulatory Visit: Payer: Self-pay

## 2023-10-29 DIAGNOSIS — Z3A28 28 weeks gestation of pregnancy: Secondary | ICD-10-CM

## 2023-11-01 ENCOUNTER — Other Ambulatory Visit: Payer: Self-pay

## 2023-11-01 ENCOUNTER — Ambulatory Visit: Payer: Self-pay | Admitting: Obstetrics & Gynecology

## 2023-11-01 VITALS — BP 124/71 | HR 97 | Wt 204.3 lb

## 2023-11-01 DIAGNOSIS — O09293 Supervision of pregnancy with other poor reproductive or obstetric history, third trimester: Secondary | ICD-10-CM

## 2023-11-01 DIAGNOSIS — O09292 Supervision of pregnancy with other poor reproductive or obstetric history, second trimester: Secondary | ICD-10-CM

## 2023-11-01 DIAGNOSIS — O0990 Supervision of high risk pregnancy, unspecified, unspecified trimester: Secondary | ICD-10-CM

## 2023-11-01 DIAGNOSIS — Z3A28 28 weeks gestation of pregnancy: Secondary | ICD-10-CM

## 2023-11-01 DIAGNOSIS — O0993 Supervision of high risk pregnancy, unspecified, third trimester: Secondary | ICD-10-CM

## 2023-11-01 DIAGNOSIS — Q513 Bicornate uterus: Secondary | ICD-10-CM

## 2023-11-01 DIAGNOSIS — O3433 Maternal care for cervical incompetence, third trimester: Secondary | ICD-10-CM

## 2023-11-01 DIAGNOSIS — Z8759 Personal history of other complications of pregnancy, childbirth and the puerperium: Secondary | ICD-10-CM

## 2023-11-01 DIAGNOSIS — O3403 Maternal care for unspecified congenital malformation of uterus, third trimester: Secondary | ICD-10-CM

## 2023-11-01 DIAGNOSIS — Z98891 History of uterine scar from previous surgery: Secondary | ICD-10-CM

## 2023-11-01 NOTE — Progress Notes (Signed)
 BTL signed on 11/01/23.

## 2023-11-01 NOTE — Progress Notes (Signed)
   PRENATAL VISIT NOTE  Subjective:  Carolyn Joseph is a 33 y.o. H3E7967 at [redacted]w[redacted]d being seen today for ongoing prenatal care.  She is currently monitored for the following issues for this high-risk pregnancy and has Bicornate uterus complicating pregnancy; History of cerclage, currently pregnant; History of prior pregnancy with IUGR newborn; History of cesarean delivery; Intrauterine pregnancy; Supervision of high risk pregnancy, antepartum; History of incompetent cervix, currently pregnant in second trimester; Cervical cerclage suture present-placed 07/23/23; and Poor fetal growth affecting management of mother in second trimester on their problem list.  Patient reports no complaints.   . Vag. Bleeding: None.  Movement: Present. Denies leaking of fluid.   The following portions of the patient's history were reviewed and updated as appropriate: allergies, current medications, past family history, past medical history, past social history, past surgical history and problem list.   Objective:    Vitals:   11/01/23 1043  BP: 124/71  Pulse: 97  Weight: 204 lb 4.8 oz (92.7 kg)    Fetal Status:  Fetal Heart Rate (bpm): 141   Movement: Present    General: Alert, oriented and cooperative. Patient is in no acute distress.  Skin: Skin is warm and dry. No rash noted.   Cardiovascular: Normal heart rate noted  Respiratory: Normal respiratory effort, no problems with respiration noted  Abdomen: Soft, gravid, appropriate for gestational age.  Pain/Pressure: Absent     Pelvic: Cervical exam deferred        Extremities: Normal range of motion.  Edema: Trace  Mental Status: Normal mood and affect. Normal behavior. Normal judgment and thought content.   Assessment and Plan:  Pregnancy: H3E7967 at [redacted]w[redacted]d 1. Supervision of high risk pregnancy, antepartum (Primary)   2. Cervical cerclage suture present in third trimester   3. History of prior pregnancy with IUGR newborn   4. History of  incompetent cervix, currently pregnant in second trimester   5. History of cesarean delivery   6. Uterus bicornis affecting pregnancy in third trimester   Preterm labor symptoms and general obstetric precautions including but not limited to vaginal bleeding, contractions, leaking of fluid and fetal movement were reviewed in detail with the patient. Please refer to After Visit Summary for other counseling recommendations.   Return in about 20 days (around 11/21/2023).  Future Appointments  Date Time Provider Department Center  11/14/2023 10:35 AM Delores Nidia CROME, FNP [facility]  11/21/2023  2:00 PM [facility] PROVIDER 1 [facility]  11/21/2023  2:15 PM [facility] US4 [facility]  11/28/2023  4:15 PM Zina Jerilynn LABOR, MD [facility]  12/12/2023  4:15 PM Delores Nidia CROME, FNP [facility]    Lynwood Solomons, MD

## 2023-11-02 LAB — CBC
Hematocrit: 37 % (ref 34.0–46.6)
Hemoglobin: 11.9 g/dL (ref 11.1–15.9)
MCH: 28.1 pg (ref 26.6–33.0)
MCHC: 32.2 g/dL (ref 31.5–35.7)
MCV: 88 fL (ref 79–97)
Platelets: 208 x10E3/uL (ref 150–450)
RBC: 4.23 x10E6/uL (ref 3.77–5.28)
RDW: 14.4 % (ref 11.7–15.4)
WBC: 9.4 x10E3/uL (ref 3.4–10.8)

## 2023-11-02 LAB — HIV ANTIBODY (ROUTINE TESTING W REFLEX): HIV Screen 4th Generation wRfx: NONREACTIVE

## 2023-11-02 LAB — GLUCOSE TOLERANCE, 2 HOURS W/ 1HR
Glucose, 1 hour: 141 mg/dL (ref 70–179)
Glucose, 2 hour: 79 mg/dL (ref 70–152)
Glucose, Fasting: 83 mg/dL (ref 70–91)

## 2023-11-02 LAB — RPR: RPR Ser Ql: NONREACTIVE

## 2023-11-14 ENCOUNTER — Encounter: Payer: Self-pay | Admitting: Obstetrics and Gynecology

## 2023-11-21 ENCOUNTER — Ambulatory Visit: Payer: Self-pay | Attending: Obstetrics and Gynecology | Admitting: Obstetrics

## 2023-11-21 ENCOUNTER — Other Ambulatory Visit: Payer: Self-pay

## 2023-11-21 ENCOUNTER — Ambulatory Visit (HOSPITAL_BASED_OUTPATIENT_CLINIC_OR_DEPARTMENT_OTHER): Payer: Self-pay

## 2023-11-21 VITALS — BP 109/84 | HR 97

## 2023-11-21 DIAGNOSIS — O365930 Maternal care for other known or suspected poor fetal growth, third trimester, not applicable or unspecified: Secondary | ICD-10-CM | POA: Insufficient documentation

## 2023-11-21 DIAGNOSIS — E669 Obesity, unspecified: Secondary | ICD-10-CM

## 2023-11-21 DIAGNOSIS — Z8759 Personal history of other complications of pregnancy, childbirth and the puerperium: Secondary | ICD-10-CM

## 2023-11-21 DIAGNOSIS — O365920 Maternal care for other known or suspected poor fetal growth, second trimester, not applicable or unspecified: Secondary | ICD-10-CM

## 2023-11-21 DIAGNOSIS — Q513 Bicornate uterus: Secondary | ICD-10-CM

## 2023-11-21 DIAGNOSIS — O3433 Maternal care for cervical incompetence, third trimester: Secondary | ICD-10-CM

## 2023-11-21 DIAGNOSIS — O34593 Maternal care for other abnormalities of gravid uterus, third trimester: Secondary | ICD-10-CM

## 2023-11-21 DIAGNOSIS — O321XX0 Maternal care for breech presentation, not applicable or unspecified: Secondary | ICD-10-CM | POA: Insufficient documentation

## 2023-11-21 DIAGNOSIS — O09292 Supervision of pregnancy with other poor reproductive or obstetric history, second trimester: Secondary | ICD-10-CM

## 2023-11-21 DIAGNOSIS — Z3A31 31 weeks gestation of pregnancy: Secondary | ICD-10-CM | POA: Insufficient documentation

## 2023-11-21 DIAGNOSIS — O3403 Maternal care for unspecified congenital malformation of uterus, third trimester: Secondary | ICD-10-CM

## 2023-11-21 DIAGNOSIS — O0990 Supervision of high risk pregnancy, unspecified, unspecified trimester: Secondary | ICD-10-CM

## 2023-11-21 DIAGNOSIS — Z3490 Encounter for supervision of normal pregnancy, unspecified, unspecified trimester: Secondary | ICD-10-CM

## 2023-11-21 DIAGNOSIS — Z98891 History of uterine scar from previous surgery: Secondary | ICD-10-CM

## 2023-11-21 DIAGNOSIS — O34219 Maternal care for unspecified type scar from previous cesarean delivery: Secondary | ICD-10-CM

## 2023-11-21 DIAGNOSIS — O99213 Obesity complicating pregnancy, third trimester: Secondary | ICD-10-CM

## 2023-11-21 NOTE — Progress Notes (Signed)
 MFM Consult Note  Carolyn Joseph is currently at 31 weeks and 2 days.  She has been followed due to a bicornuate uterus with a cerclage in place.    A small for gestational age fetus has been noted during her prior exams.  She had a fetal echocardiogram performed with [facility] that showed a possible small mid muscular VSD.  She has 2 prior cesarean deliveries due to breech presentation.  Her first child weighed only 4-1/2 pounds at term.  She denies any problems since her last exam.  The overall EFW obtained today of 3 pounds 8 ounces measured at the 18th percentile for her gestational age.    There was normal amniotic fluid noted with a total AFI of 20.68 cm.  The fetus remains in the breech presentation.  A follow-up exam was scheduled in 4 weeks to assess the fetal growth.  Fetal kick count instructions were reviewed.  The patient stated that all of her questions were answered today.  A total of 10 minutes was spent counseling and coordinating the care for this patient.  Greater than 50% of the time was spent in direct face-to-face contact.

## 2023-11-28 ENCOUNTER — Encounter: Payer: Self-pay | Admitting: Obstetrics and Gynecology

## 2023-12-11 ENCOUNTER — Encounter (HOSPITAL_COMMUNITY): Payer: Self-pay | Admitting: Obstetrics and Gynecology

## 2023-12-11 ENCOUNTER — Other Ambulatory Visit: Payer: Self-pay

## 2023-12-11 ENCOUNTER — Inpatient Hospital Stay (HOSPITAL_COMMUNITY)
Admission: AD | Admit: 2023-12-11 | Discharge: 2023-12-11 | Disposition: A | Discharge status: Home / Self Care | Payer: Self-pay | Attending: Obstetrics and Gynecology | Admitting: Obstetrics and Gynecology

## 2023-12-11 DIAGNOSIS — O479 False labor, unspecified: Secondary | ICD-10-CM

## 2023-12-11 DIAGNOSIS — R519 Headache, unspecified: Secondary | ICD-10-CM | POA: Insufficient documentation

## 2023-12-11 DIAGNOSIS — Z3A34 34 weeks gestation of pregnancy: Secondary | ICD-10-CM

## 2023-12-11 DIAGNOSIS — G44209 Tension-type headache, unspecified, not intractable: Secondary | ICD-10-CM

## 2023-12-11 DIAGNOSIS — O4703 False labor before 37 completed weeks of gestation, third trimester: Secondary | ICD-10-CM | POA: Insufficient documentation

## 2023-12-11 LAB — URINALYSIS, ROUTINE W REFLEX MICROSCOPIC
Bilirubin Urine: NEGATIVE
Glucose, UA: NEGATIVE mg/dL
Hgb urine dipstick: NEGATIVE
Ketones, ur: NEGATIVE mg/dL
Leukocytes,Ua: NEGATIVE
Nitrite: NEGATIVE
Protein, ur: NEGATIVE mg/dL
Specific Gravity, Urine: 1.02 (ref 1.005–1.030)
pH: 5 (ref 5.0–8.0)

## 2023-12-11 NOTE — MAU Provider Note (Signed)
 History     [id number]  Arrival date and time: 12/11/23 2210    Chief Complaint  Patient presents with   Contractions     HPI Carolyn Joseph is a 33 y.o. at [redacted]w[redacted]d by 7.5w US , who presents for dizziness and palpations that have resolved. She states that earlier in the day she felt like her BP was getting low. She does not have a BP cuff at home and does not check it. She also endorses a headache earlier but it resolved with rest. She currently is asymptomatic. Denies any associated symptoms.     --/--/O POS (04/29 1033)  Past Medical History:  Diagnosis Date   Bicornate uterus    Hip fracture (HCC) 2015   History of cesarean delivery 05/25/2019   LTCS 3/20 at 37 weeks d/t breech and IUGR   History of incompetent cervix, currently pregnant 05/21/2019   Cerclage placed 06/16/19 by Dr Alger   Ovarian cyst    Supervision of high risk pregnancy, antepartum 05/21/2019    Nursing Staff Provider Office Location  [facility] Dating  10 wk US  Language   English Anatomy US   normal Flu Vaccine   Genetic Screen  NIPS: negative  AFP:  negative First Screen:  Quad:   TDaP vaccine    Hgb A1C or  GTT Early  Third trimester Nml 2 hr GTT Rhogam     LAB RESULTS  Feeding Plan  Breast  Blood Type --/--/O POS (03/17 0205)  Contraception  Antibody NEG (03/17 0205) Circumcision  Rubel    Past Surgical History:  Procedure Laterality Date   CERVICAL CERCLAGE N/A 01/20/2018   Procedure: CERCLAGE CERVICAL;  Surgeon: Lorence Ozell CROME, MD;  [facility]: [facility];  Service: Gynecology;  Laterality: N/A;   CERVICAL CERCLAGE N/A 06/16/2019   Procedure: CERCLAGE CERVICAL;  Surgeon: Alger Gong, MD;  Location: [facility] LD ORS;  Service: Gynecology;  Laterality: N/A;   CERVICAL CERCLAGE N/A 07/23/2023   Procedure: CERCLAGE, CERVIX, VAGINAL APPROACH;  Surgeon: Alger Gong, MD;  Location: [facility] LD ORS;  Service: Obstetrics;  Laterality: N/A;   CESAREAN SECTION N/A 06/10/2018   Procedure: CESAREAN  SECTION;  Surgeon: Eveline Lynwood MATSU, MD;  Location: [facility] LD [facility]S;  Service: Obstetrics;  Laterality: N/A;   CESAREAN SECTION  12/08/2019   Procedure: CESAREAN SECTION;  Surgeon: Trudy Rozelle DASEN, MD;  Location: [facility]S;  Service: Obstetrics;;   HIP FRACTURE SURGERY Bilateral 2015    Family History  Problem Relation Age of Onset   Diabetes Mother    Hypertension Mother    Diabetes Maternal Grandmother    Asthma Neg Hx    Cancer Neg Hx    Heart disease Neg Hx    Stroke Neg Hx     Social History   Socioeconomic History   Marital status: Married    Spouse name: Not on file   Number of children: 2   Years of education: Not on file   Highest education level: High school graduate  Occupational History   Not on file  Tobacco Use   Smoking status: Former    Current packs/day: 0.00    Types: Cigarettes    Quit date: 2015    Years since quitting: 10.7   Smokeless tobacco: Never   Tobacco comments:    in high school  Vaping Use   Vaping status: Never Used  Substance and Sexual Activity   Alcohol use: Not Currently   Drug use: Never   Sexual activity: Not Currently  Birth control/protection: Condom  Other Topics Concern   Not on file  Social History Narrative   Not on file   Social Drivers of Health   Financial Resource Strain: Not on file  Food Insecurity: Food Insecurity Present (07/11/2023)   Hunger Vital Sign    Worried About Running Out of Food in the Last Year: Sometimes true    Ran Out of Food in the Last Year: Never true  Transportation Needs: No Transportation Needs (10/25/2022)   PRAPARE - Administrator, Civil Service (Medical): No    Lack of Transportation (Non-Medical): No  Physical Activity: Not on file  Stress: Not on file  Social Connections: Not on file  Intimate Partner Violence: Not on file    No Known Allergies  No current facility-administered medications on file prior to encounter.   Current Outpatient Medications on File Prior to  Encounter  Medication Sig Dispense Refill   Prenatal Vit-Fe Fumarate-FA (PRENATAL MULTIVITAMIN) TABS tablet Take 1 tablet by mouth daily at 12 noon.     ferrous sulfate  325 (65 FE) MG tablet Take 325 mg by mouth daily. (Patient not taking: Reported on 11/01/2023)      Pertinent positives and negative per HPI, all others reviewed and negative  Physical Exam   BP 126/71   Pulse 95   Temp 98.5 F (36.9 C) (Oral)   Resp 18   Ht 5' 5 (1.651 m)   Wt 94.6 kg   LMP 04/08/2023 (Exact Date)   SpO2 99%   BMI 34.70 kg/m   Patient Vitals for the past 24 hrs:  BP Temp Temp src Pulse Resp SpO2 Height Weight  12/11/23 2250 126/71 -- -- 95 -- -- -- --  12/11/23 2232 117/71 98.5 F (36.9 C) Oral 94 18 99 % 5' 5 (1.651 m) 94.6 kg    Physical Exam Vitals and nursing note reviewed.  Constitutional:      Appearance: She is well-developed.  HENT:     Head: Normocephalic and atraumatic.     Mouth/Throat:     Mouth: Mucous membranes are moist.  Eyes:     Extraocular Movements: Extraocular movements intact.  Cardiovascular:     Rate and Rhythm: Normal rate and regular rhythm.  Pulmonary:     Effort: Pulmonary effort is normal.  Abdominal:     Palpations: Abdomen is soft.     Tenderness: There is no abdominal tenderness.  Skin:    Capillary Refill: Capillary refill takes less than 2 seconds.  Neurological:     General: No focal deficit present.     Mental Status: She is alert.     FHT Baseline: 140 bpm Variability: Good {> 6 bpm) Accelerations: Reactive Decelerations: Absent Uterine activity: None   Labs Results for orders placed or performed during the hospital encounter of 12/11/23 (from the past 24 hours)  Urinalysis, Routine w reflex microscopic -Urine, Clean Catch     Status: Abnormal   Collection Time: 12/11/23 10:53 PM  Result Value Ref Range   Color, Urine YELLOW YELLOW   APPearance HAZY (A) CLEAR   Specific Gravity, Urine 1.020 1.005 - 1.030   pH 5.0 5.0 - 8.0    Glucose, UA NEGATIVE NEGATIVE mg/dL   Hgb urine dipstick NEGATIVE NEGATIVE   Bilirubin Urine NEGATIVE NEGATIVE   Ketones, ur NEGATIVE NEGATIVE mg/dL   Protein, ur NEGATIVE NEGATIVE mg/dL   Nitrite NEGATIVE NEGATIVE   Leukocytes,Ua NEGATIVE NEGATIVE   RBC / HPF 0-5 0 - 5 RBC/hpf  WBC, UA 0-5 0 - 5 WBC/hpf   Bacteria, UA RARE (A) NONE SEEN   Squamous Epithelial / HPF 6-10 0 - 5 /HPF   Mucus PRESENT     Imaging No results found.  MAU Course  Procedures  Lab Orders         Urinalysis, Routine w reflex microscopic -Urine, Clean Catch    No orders of the defined types were placed in this encounter.  Imaging Orders  No imaging studies ordered today    MDM Moderate (Level 3-4)  Assessment and Plan   #[redacted] weeks gestation of pregnancy #Headache #Braxton Vinie  #FWB NST: Reactive  Carolyn Joseph is a 33 y.o. at [redacted]w[redacted]d by 7.5w US , who presents for dizziness and palpations that have resolved.  -All symptoms have resolved, patient has no associated symptoms -NST reactive -Patient is normotensive -No contraction on the monitor -Denies VB, LOF. Endorses good FM  -Safe to discharge home -Preterm labor precautions provided.      Syriah Delisi L Antoria Lanza, MD/MHA 12/11/23 11:48 PM  Allergies as of 12/11/2023   No Known Allergies      Medication List     TAKE these medications    ferrous sulfate  325 (65 FE) MG tablet Take 325 mg by mouth daily.   prenatal multivitamin Tabs tablet Take 1 tablet by mouth daily at 12 noon.

## 2023-12-11 NOTE — MAU Note (Signed)
 Carolyn Joseph is a 33 y.o. at [redacted]w[redacted]d here in MAU reporting: believes her blood pressure dropped earlier because her heart was beating fast and she felt SOB - lasted about 20 minutes and went away. Reports pelvic pressure and frequent ctx since 1400. Denies VB or LOF. States baby has moved less than normal today - last movement felt 10 minutes ago.   LMP: NA Pain score: 2 Vitals:   12/11/23 2232  BP: 117/71  Pulse: 94  Resp: 18  Temp: 98.5 F (36.9 C)  SpO2: 99%     FHT: 158  Lab orders placed from triage: UA.

## 2023-12-12 ENCOUNTER — Encounter: Payer: Self-pay | Admitting: Obstetrics and Gynecology

## 2023-12-19 ENCOUNTER — Other Ambulatory Visit: Payer: Self-pay | Admitting: *Deleted

## 2023-12-19 ENCOUNTER — Ambulatory Visit (HOSPITAL_BASED_OUTPATIENT_CLINIC_OR_DEPARTMENT_OTHER): Payer: Self-pay

## 2023-12-19 ENCOUNTER — Ambulatory Visit: Payer: Self-pay | Attending: Obstetrics | Admitting: Obstetrics

## 2023-12-19 ENCOUNTER — Other Ambulatory Visit: Payer: Self-pay | Admitting: Obstetrics

## 2023-12-19 VITALS — BP 116/66 | HR 93

## 2023-12-19 DIAGNOSIS — O365920 Maternal care for other known or suspected poor fetal growth, second trimester, not applicable or unspecified: Secondary | ICD-10-CM

## 2023-12-19 DIAGNOSIS — O34593 Maternal care for other abnormalities of gravid uterus, third trimester: Secondary | ICD-10-CM | POA: Insufficient documentation

## 2023-12-19 DIAGNOSIS — O3403 Maternal care for unspecified congenital malformation of uterus, third trimester: Secondary | ICD-10-CM

## 2023-12-19 DIAGNOSIS — O99213 Obesity complicating pregnancy, third trimester: Secondary | ICD-10-CM

## 2023-12-19 DIAGNOSIS — O34219 Maternal care for unspecified type scar from previous cesarean delivery: Secondary | ICD-10-CM | POA: Insufficient documentation

## 2023-12-19 DIAGNOSIS — E669 Obesity, unspecified: Secondary | ICD-10-CM

## 2023-12-19 DIAGNOSIS — O365930 Maternal care for other known or suspected poor fetal growth, third trimester, not applicable or unspecified: Secondary | ICD-10-CM

## 2023-12-19 DIAGNOSIS — Z3A35 35 weeks gestation of pregnancy: Secondary | ICD-10-CM | POA: Insufficient documentation

## 2023-12-19 DIAGNOSIS — Z8759 Personal history of other complications of pregnancy, childbirth and the puerperium: Secondary | ICD-10-CM

## 2023-12-19 DIAGNOSIS — Z3490 Encounter for supervision of normal pregnancy, unspecified, unspecified trimester: Secondary | ICD-10-CM

## 2023-12-19 DIAGNOSIS — O321XX0 Maternal care for breech presentation, not applicable or unspecified: Secondary | ICD-10-CM

## 2023-12-19 DIAGNOSIS — O365931 Maternal care for other known or suspected poor fetal growth, third trimester, fetus 1: Secondary | ICD-10-CM

## 2023-12-19 DIAGNOSIS — O3433 Maternal care for cervical incompetence, third trimester: Secondary | ICD-10-CM

## 2023-12-19 DIAGNOSIS — O09293 Supervision of pregnancy with other poor reproductive or obstetric history, third trimester: Secondary | ICD-10-CM | POA: Insufficient documentation

## 2023-12-19 DIAGNOSIS — O0990 Supervision of high risk pregnancy, unspecified, unspecified trimester: Secondary | ICD-10-CM

## 2023-12-19 DIAGNOSIS — O43193 Other malformation of placenta, third trimester: Secondary | ICD-10-CM

## 2023-12-19 DIAGNOSIS — Q513 Bicornate uterus: Secondary | ICD-10-CM | POA: Insufficient documentation

## 2023-12-19 NOTE — Progress Notes (Signed)
 MFM Consult Note  Carolyn Joseph is currently at 35 weeks and 2 days.  She has been followed due to a bicornuate uterus with a cerclage in place.  IUGR was noted earlier in her pregnancy.  She has 2 prior cesarean deliveries due to breech presentation.  Her first child weighed only 4-1/2 pounds at term.  She denies any problems since her last exam.  Sonographic findings Single intrauterine pregnancy. Fetal cardiac activity: Observed. Presentation: Breech. Interval fetal anatomy appears normal. Fetal biometry shows the estimated fetal weight of 4 pounds 9 ounces which measures at the 4th percentile, indicating IUGR.  The fetus grew about 1 pound over the past 4 weeks. Amniotic fluid: Within normal limits.  MVP: 5.33 cm. Placenta: Anterior. BPP: 8/8.   Doppler studies of the umbilical arteries performed today showed a normal S/D ratio of 2.34.  There were no signs of absent or reversed end-diastolic flow.  The fetus remains in the breech presentation today.  Due to IUGR, we will continue to follow her with weekly fetal testing and umbilical artery Doppler studies.    Due to IUGR and breech presentation, her repeat C-section should be scheduled at between 37 to 38 weeks.  She will return in 1 week for another BPP and umbilical artery Doppler study.  The patient stated that all of her questions were answered today.  A total of 20 minutes was spent counseling and coordinating the care for this patient.  Greater than 50% of the time was spent in direct face-to-face contact.

## 2023-12-26 ENCOUNTER — Other Ambulatory Visit: Payer: Self-pay

## 2023-12-26 ENCOUNTER — Ambulatory Visit (INDEPENDENT_AMBULATORY_CARE_PROVIDER_SITE_OTHER): Payer: Self-pay | Admitting: Physician Assistant

## 2023-12-26 ENCOUNTER — Encounter: Payer: Self-pay | Admitting: Physician Assistant

## 2023-12-26 VITALS — BP 116/78 | HR 104 | Wt 209.8 lb

## 2023-12-26 DIAGNOSIS — O09293 Supervision of pregnancy with other poor reproductive or obstetric history, third trimester: Secondary | ICD-10-CM

## 2023-12-26 DIAGNOSIS — O3433 Maternal care for cervical incompetence, third trimester: Secondary | ICD-10-CM

## 2023-12-26 DIAGNOSIS — O0990 Supervision of high risk pregnancy, unspecified, unspecified trimester: Secondary | ICD-10-CM

## 2023-12-26 DIAGNOSIS — O0993 Supervision of high risk pregnancy, unspecified, third trimester: Secondary | ICD-10-CM

## 2023-12-26 DIAGNOSIS — O365930 Maternal care for other known or suspected poor fetal growth, third trimester, not applicable or unspecified: Secondary | ICD-10-CM

## 2023-12-26 DIAGNOSIS — Z3A36 36 weeks gestation of pregnancy: Secondary | ICD-10-CM

## 2023-12-26 DIAGNOSIS — O365920 Maternal care for other known or suspected poor fetal growth, second trimester, not applicable or unspecified: Secondary | ICD-10-CM

## 2023-12-26 DIAGNOSIS — Z98891 History of uterine scar from previous surgery: Secondary | ICD-10-CM

## 2023-12-26 DIAGNOSIS — O09292 Supervision of pregnancy with other poor reproductive or obstetric history, second trimester: Secondary | ICD-10-CM

## 2023-12-26 NOTE — Progress Notes (Signed)
 Sent In Basket to [facility] for Patient Contact.

## 2023-12-26 NOTE — Progress Notes (Signed)
   PRENATAL VISIT NOTE  Subjective:  Carolyn Joseph is a 33 y.o. H3E7967 at [redacted]w[redacted]d being seen today for ongoing prenatal care.  She is currently monitored for the following issues for this high-risk pregnancy and has Bicornate uterus complicating pregnancy; History of cerclage, currently pregnant; History of prior pregnancy with IUGR newborn; History of cesarean delivery; Intrauterine pregnancy; Supervision of high risk pregnancy, antepartum; History of incompetent cervix, currently pregnant in second trimester; Cervical cerclage suture present-placed 07/23/23; and Poor fetal growth affecting management of mother in second trimester on their problem list.  Patient reports no complaints.  Contractions: Irritability. Vag. Bleeding: None.  Movement: Present. Denies leaking of fluid.   The following portions of the patient's history were reviewed and updated as appropriate: allergies, current medications, past family history, past medical history, past social history, past surgical history and problem list.   Objective:    Vitals:   12/26/23 1609  BP: 116/78  Pulse: (!) 104  Weight: 209 lb 12.8 oz (95.2 kg)    Fetal Status:  Fetal Heart Rate (bpm): 140 Fundal Height: 34 cm Movement: Present    General: Alert, oriented and cooperative. Patient is in no acute distress.  Skin: Skin is warm and dry. No rash noted.   Cardiovascular: Normal heart rate noted  Respiratory: Normal respiratory effort, no problems with respiration noted  Abdomen: Soft, gravid, appropriate for gestational age.  Pain/Pressure: Absent     Pelvic: Cervical exam deferred        Extremities: Normal range of motion.  Edema: Trace  Mental Status: Normal mood and affect. Normal behavior. Normal judgment and thought content.   Assessment and Plan:  Pregnancy: H3E7967 at [redacted]w[redacted]d  1. Supervision of high risk pregnancy, antepartum (Primary) Patient doing well, feeling regular fetal movement BP, FHR, FH appropriate    2. [redacted] weeks gestation of pregnancy Anticipatory guidance about next visits/weeks of pregnancy given.  Patient declines 36 week swabs this visit due to self-pay  3. Poor fetal growth affecting management of mother in second trimester, single or unspecified fetus 12/19/23 EFW 2077 gm (4.2 %), HC/AC: 1.03, AFI WNL, fetus in breech position Repeat CSD at 37-38 weeks Placed request for 10/14 (38 weeks) Continue weekly antenatal testing, UAD  4. History of incompetent cervix, currently pregnant in second trimester 5. Cervical cerclage suture present in third trimester  6. History of cesarean delivery X2   Preterm labor symptoms and general obstetric precautions including but not limited to vaginal bleeding, contractions, leaking of fluid and fetal movement were reviewed in detail with the patient.  Please refer to After Visit Summary for other counseling recommendations.   Return in about 1 week (around 01/02/2024) for HOB+GBS.  Future Appointments  Date Time Provider Department Center  01/02/2024  1:55 PM Claudene Cella , CNM [facility]  01/10/2024 10:55 AM Emilio Delilah HERO, CNM [facility]  01/17/2024  8:35 AM Ilean Norleen GAILS, MD [facility]  01/23/2024  4:15 PM Eveline Lynwood MATSU, MD [facility]    Jorene FORBES Moats, PA-C

## 2023-12-29 NOTE — Anesthesia Preprocedure Evaluation (Addendum)
 Anesthesia Evaluation  Patient identified by MRN, date of birth, ID band Patient awake    Reviewed: Allergy & Precautions, NPO status , Patient's Chart, lab work & pertinent test results  Airway Mallampati: II  TM Distance: >3 FB Neck ROM: Full    Dental  (+) Dental Advisory Given   Pulmonary former smoker   Pulmonary exam normal        Cardiovascular negative cardio ROS Normal cardiovascular exam     Neuro/Psych negative neurological ROS  negative psych ROS   GI/Hepatic negative GI ROS, Neg liver ROS,,,  Endo/Other  BMI 35  Renal/GU negative Renal ROS  negative genitourinary   Musculoskeletal negative musculoskeletal ROS (+)    Abdominal   Peds  Hematology negative hematology ROS (+)   Anesthesia Other Findings   Reproductive/Obstetrics 2 prior section 2020, 2021 both under spinal  Bicornuate uterus  Cervical incompetency s/p cerclage 07/23/23                              Anesthesia Physical Anesthesia Plan  ASA: 2  Anesthesia Plan: Spinal   Post-op Pain Management: Ofirmev  IV (intra-op)* and Toradol  IV (intra-op)*   Induction:   PONV Risk Score and Plan: 3 and Ondansetron , Dexamethasone , Treatment may vary due to age or medical condition and Scopolamine  patch - Pre-op  Airway Management Planned: Natural Airway  Additional Equipment: None  Intra-op Plan:   Post-operative Plan:   Informed Consent: I have reviewed the patients History and Physical, chart, labs and discussed the procedure including the risks, benefits and alternatives for the proposed anesthesia with the patient or authorized representative who has indicated his/her understanding and acceptance.       Plan Discussed with: CRNA and Anesthesiologist  Anesthesia Plan Comments: (Labs reviewed, platelets acceptable. Discussed risks and benefits of spinal, including spinal/epidural hematoma, infection, failed  block, and PDPH. Patient expressed understanding and wished to proceed. )         Anesthesia Quick Evaluation

## 2023-12-30 NOTE — Patient Instructions (Signed)
 Syrianna Merlos-Portillo  12/30/2023   Your procedure is scheduled on:  01/07/2024  Arrive at 0730 at [employer] [facility] on [address] at [facility]  and [employer]. You are invited to use the FREE valet parking or use the Visitor's parking deck.  Pick up the phone at the desk and dial [phone].  Call this number if you have problems the morning of surgery: [phone]  Remember:   Do not eat food:(After Midnight) Desps de medianoche.  You may drink clear liquids until  ___0530__.  Clear liquids means a liquid you can see thru.  It can have color such as Cola or Kool aid.  Tea is OK and coffee as long as no milk or creamer of any kind.  Take these medicines the morning of surgery with A SIP OF WATER :  none   Do not wear jewelry, make-up or nail polish.  Do not wear lotions, powders, or perfumes. Do not wear deodorant.  Do not shave 48 hours prior to surgery.  Do not bring valuables to the hospital.  [facility] is not   responsible for any belongings or valuables brought to the hospital.  Contacts, dentures or bridgework may not be worn into surgery.  Leave suitcase in the car. After surgery it may be brought to your room.  For patients admitted to the hospital, checkout time is 11:00 AM the day of              discharge.      Please read over the following fact sheets that you were given:     Preparing for Surgery

## 2023-12-31 ENCOUNTER — Ambulatory Visit: Payer: Self-pay

## 2023-12-31 ENCOUNTER — Encounter (HOSPITAL_COMMUNITY): Payer: Self-pay

## 2023-12-31 ENCOUNTER — Ambulatory Visit: Payer: Self-pay | Attending: Maternal & Fetal Medicine | Admitting: Maternal & Fetal Medicine

## 2023-12-31 VITALS — BP 118/79

## 2023-12-31 DIAGNOSIS — O43193 Other malformation of placenta, third trimester: Secondary | ICD-10-CM

## 2023-12-31 DIAGNOSIS — Z3A37 37 weeks gestation of pregnancy: Secondary | ICD-10-CM

## 2023-12-31 DIAGNOSIS — O365930 Maternal care for other known or suspected poor fetal growth, third trimester, not applicable or unspecified: Secondary | ICD-10-CM

## 2023-12-31 DIAGNOSIS — Q513 Bicornate uterus: Secondary | ICD-10-CM | POA: Insufficient documentation

## 2023-12-31 DIAGNOSIS — O365920 Maternal care for other known or suspected poor fetal growth, second trimester, not applicable or unspecified: Secondary | ICD-10-CM

## 2023-12-31 DIAGNOSIS — O0990 Supervision of high risk pregnancy, unspecified, unspecified trimester: Secondary | ICD-10-CM

## 2023-12-31 DIAGNOSIS — O99213 Obesity complicating pregnancy, third trimester: Secondary | ICD-10-CM | POA: Insufficient documentation

## 2023-12-31 DIAGNOSIS — O34219 Maternal care for unspecified type scar from previous cesarean delivery: Secondary | ICD-10-CM | POA: Insufficient documentation

## 2023-12-31 DIAGNOSIS — Z3490 Encounter for supervision of normal pregnancy, unspecified, unspecified trimester: Secondary | ICD-10-CM

## 2023-12-31 DIAGNOSIS — Z8759 Personal history of other complications of pregnancy, childbirth and the puerperium: Secondary | ICD-10-CM

## 2023-12-31 DIAGNOSIS — O09293 Supervision of pregnancy with other poor reproductive or obstetric history, third trimester: Secondary | ICD-10-CM | POA: Insufficient documentation

## 2023-12-31 DIAGNOSIS — O368193 Decreased fetal movements, unspecified trimester, fetus 3: Secondary | ICD-10-CM | POA: Insufficient documentation

## 2023-12-31 DIAGNOSIS — O34593 Maternal care for other abnormalities of gravid uterus, third trimester: Secondary | ICD-10-CM | POA: Insufficient documentation

## 2023-12-31 DIAGNOSIS — O3403 Maternal care for unspecified congenital malformation of uterus, third trimester: Secondary | ICD-10-CM

## 2023-12-31 DIAGNOSIS — O368130 Decreased fetal movements, third trimester, not applicable or unspecified: Secondary | ICD-10-CM

## 2023-12-31 DIAGNOSIS — E669 Obesity, unspecified: Secondary | ICD-10-CM

## 2023-12-31 DIAGNOSIS — O3433 Maternal care for cervical incompetence, third trimester: Secondary | ICD-10-CM | POA: Insufficient documentation

## 2023-12-31 NOTE — Progress Notes (Signed)
 Patient information  Patient Name: Carolyn Joseph  Patient MRN:   [id number]  Referring practice: MFM Referring Provider: [facility])  Problem List   Patient Active Problem List   Diagnosis Date Noted   Poor fetal growth affecting management of mother in second trimester 09/10/2023   Cervical cerclage suture present-placed 07/23/23 08/26/2023   History of incompetent cervix, currently pregnant in second trimester 07/23/2023   Supervision of high risk pregnancy, antepartum 07/04/2023   Intrauterine pregnancy 06/09/2023   History of cesarean delivery 12/08/2019   History of cerclage, currently pregnant 05/21/2019   History of prior pregnancy with IUGR newborn 05/21/2019   Bicornate uterus complicating pregnancy 11/18/2017   Maternal Fetal medicine Consult  Carolyn Joseph is a 33 y.o. H3E7967 at [redacted]w[redacted]d here for ultrasound and consultation. Carolyn Joseph is doing well today with no acute concerns. Today we focused on the following:   FGR: The patient was an add-on today due to decreased perception of fetal movement yesterday but today she reports normal fetal movement.  We were able to reassure the patient that the movement is normal and she visualized the fetal movement on the ultrasound.  Moreover the biophysical profile was 8 out of 8 with normal umbilical artery Dopplers.  She is scheduled for repeat cesarean delivery in 7 days.  She also desires a tubal ligation and she will discuss this with her OB provider.  I discussed the importance of monitoring fetal movement and to return to the hospital or call the clinic with any concerns of decreased fetal movement. The patient had time to ask questions that were answered to her satisfaction.  She verbalized understanding and agrees to proceed with the plan below.  Sonographic findings Single intrauterine pregnancy at 37w 0d. Fetal cardiac activity:  Observed and appears  normal. Presentation: Breech. Interval fetal anatomy appears normal. Amniotic fluid: Within normal limits.  MVP: 5.82 cm. Placenta: Anterior. Umbilical artery dopplers findings: -S/D:2.52 which are normal at this gestational age.  -Absent end-diastolic flow: No.  -Reversed end-diastolic flow:  No. BPP 8/8.   There are limitations of prenatal ultrasound such as the inability to detect certain abnormalities due to poor visualization. Various factors such as fetal position, gestational age and maternal body habitus may increase the difficulty in visualizing the fetal anatomy.    Recommendations - Fetal movement precautions given. - Cesarean delivery with tubal ligation to be done on 10/14 as scheduled. - Cerclage to be removed at the time of CD.  Review of Systems: A review of systems was performed and was negative except per HPI   Vitals and Physical Exam    12/31/2023   11:34 AM 12/26/2023    4:09 PM 12/19/2023    2:03 PM  Vitals with BMI  Weight  209 lbs 13 oz   BMI  34.91   Systolic 118 116 883  Diastolic 79 78 66  Pulse  104 93    Sitting comfortably on the sonogram table Nonlabored breathing Normal rate and rhythm Abdomen is nontender  Past pregnancies OB History  Gravida Para Term Preterm AB Living  6 2 2  0 3 2  SAB IAB Ectopic Multiple Live Births  2 0 0 0 2    # Outcome Date GA Lbr Len/2nd Weight Sex Type Anes PTL Lv  6 Current           5 AB 08/14/22 [redacted]w[redacted]d    SAB     4 Term 12/08/19 [redacted]w[redacted]d  6 lb 2.4 oz (2.79 kg) F CS-LTranv Spinal  LIV  3 Term 06/10/18 [redacted]w[redacted]d  4 lb 9.9 oz (2.095 kg) F CS-LTranv Spinal  LIV     Birth Comments: IUGR, c/s for breach  2 SAB 08/13/15 [redacted]w[redacted]d         1 SAB 01/12/14 [redacted]w[redacted]d      N FD     I spent 30 minutes reviewing the patients chart, including labs and images as well as counseling the patient about her medical conditions. Greater than 50% of the time was spent in direct face-to-face patient counseling.  Delora Smaller  MFM, [facility]    12/31/2023  12:24 PM

## 2024-01-01 ENCOUNTER — Encounter (HOSPITAL_COMMUNITY): Payer: Self-pay

## 2024-01-02 ENCOUNTER — Encounter: Payer: Self-pay | Admitting: Advanced Practice Midwife

## 2024-01-02 DIAGNOSIS — O35BXX0 Maternal care for other (suspected) fetal abnormality and damage, fetal cardiac anomalies, not applicable or unspecified: Secondary | ICD-10-CM | POA: Insufficient documentation

## 2024-01-02 NOTE — Progress Notes (Deleted)
   PRENATAL VISIT NOTE  Subjective:  Carolyn Joseph is a 33 y.o. H3E7967 at [redacted]w[redacted]d being seen today for ongoing prenatal care.  She is currently monitored for the following issues for this high-risk pregnancy and has Bicornate uterus complicating pregnancy; History of cerclage, currently pregnant; History of prior pregnancy with IUGR newborn; History of cesarean delivery; Intrauterine pregnancy; Supervision of high risk pregnancy, antepartum; History of incompetent cervix, currently pregnant in second trimester; Cervical cerclage suture present-placed 07/23/23; Poor fetal growth affecting management of mother in second trimester; and Ventricular septal defect (VSD) of fetus affecting management of pregnancy on their problem list.   Patient reports {sx:14538}.   .  .   . Denies leaking of fluid.   The following portions of the patient's history were reviewed and updated as appropriate: allergies, current medications, past family history, past medical history, past social history, past surgical history and problem list.   Objective:  There were no vitals filed for this visit.  Fetal Status:           General:  Alert, oriented and cooperative. Patient is in no acute distress.  Skin: Skin is warm and dry. No rash noted.   Cardiovascular: Normal heart rate noted  Respiratory: Normal respiratory effort, no problems with respiration noted  Abdomen: Soft, gravid, appropriate for gestational age.        Pelvic: {Blank single:19197::Cervical exam performed in the presence of a chaperone,Cervical exam deferred}        Extremities: Normal range of motion.     Mental Status: Normal mood and affect. Normal behavior. Normal judgment and thought content.   US  12/19/23: Est. FW: 2077 gm 4 lb 9 oz 4.2 %   Assessment and Plan:  Pregnancy: H3E7967 at [redacted]w[redacted]d 1. Uterus bicornis affecting pregnancy in third trimester (Primary)  2. Cervical cerclage suture present in third trimester  3. History of  prior pregnancy with IUGR newborn  4. History of cesarean delivery  5. Supervision of high risk pregnancy, antepartum  6. Poor fetal growth affecting management of mother in second trimester, single or unspecified fetus  7. [redacted] weeks gestation of pregnancy  8. Ventricular septal defect (VSD) of fetus affecting management of pregnancy  Term labor symptoms and general obstetric precautions including but not limited to vaginal bleeding, contractions, leaking of fluid and fetal movement were reviewed in detail with the patient. Please refer to After Visit Summary for other counseling recommendations.   No follow-ups on file.  Future Appointments  Date Time Provider Department Center  01/06/2024  9:00 AM [facility] PAT 1 [facility]-INDC None  01/10/2024 10:55 AM Emilio Delilah HERO, CNM [facility]  01/17/2024  8:35 AM Ilean Norleen GAILS, MD [facility]  01/23/2024  4:15 PM Eveline Lynwood MATSU, MD [facility]    Bryse Blanchette  Claudene, CNM [facility]

## 2024-01-02 NOTE — Patient Instructions (Incomplete)
   Tubal Ligation with C/S or Postpartum   With C/S: $1170  Postpartum: $1664 + anesthesia + facility fees  Patient needs to inform staff of desire for tubal ligation as early as possible in the pregnancy  Pre-payment is required for procedure to be scheduled  Payment plans are available upon request   Patient will need to call [facility] (Anesthesia) at [phone] for estimate Patient will need to contact pre-service center for estimate of facility fees for postpartum only at [phone]  Ligadura de trompas con cesrea o despus del parto  Con cesrea: $1170  Despus del parto: $1664 ms la anestesia + facility fee  La paciente debe informarle al personal de su deseo de hacerse la ligadura de trompas tan pronto  como le sea posible durante el [place]. Se requiere el pago por adelantado para que el procedimiento pueda programarse. Los planes de pago estn disponibles por solicitud.  La paciente debe llamar a [facility] Janann August) al [phone] para pedir la cotizacin. Malva Limes debe llamar a pre-service center al [phone] para pedir la cotizacin.

## 2024-01-06 ENCOUNTER — Encounter (HOSPITAL_COMMUNITY)
Admission: RE | Admit: 2024-01-06 | Discharge: 2024-01-06 | Disposition: A | Discharge status: Home / Self Care | Payer: Self-pay | Source: Ambulatory Visit | Attending: Family Medicine | Admitting: Family Medicine

## 2024-01-06 ENCOUNTER — Other Ambulatory Visit: Payer: Self-pay | Admitting: Family Medicine

## 2024-01-06 DIAGNOSIS — O0990 Supervision of high risk pregnancy, unspecified, unspecified trimester: Secondary | ICD-10-CM

## 2024-01-06 DIAGNOSIS — Z01812 Encounter for preprocedural laboratory examination: Secondary | ICD-10-CM | POA: Insufficient documentation

## 2024-01-06 DIAGNOSIS — O0993 Supervision of high risk pregnancy, unspecified, third trimester: Secondary | ICD-10-CM | POA: Insufficient documentation

## 2024-01-06 HISTORY — DX: Hypotension, unspecified: I95.9

## 2024-01-06 HISTORY — DX: Anemia, unspecified: D64.9

## 2024-01-06 LAB — CBC
HCT: 40.4 % (ref 36.0–46.0)
Hemoglobin: 13.1 g/dL (ref 12.0–15.0)
MCH: 27.8 pg (ref 26.0–34.0)
MCHC: 32.4 g/dL (ref 30.0–36.0)
MCV: 85.6 fL (ref 80.0–100.0)
Platelets: 194 K/uL (ref 150–400)
RBC: 4.72 MIL/uL (ref 3.87–5.11)
RDW: 15.1 % (ref 11.5–15.5)
WBC: 8.8 K/uL (ref 4.0–10.5)
nRBC: 0 % (ref 0.0–0.2)

## 2024-01-06 LAB — TYPE AND SCREEN
ABO/RH(D): O POS
Antibody Screen: NEGATIVE

## 2024-01-06 LAB — RPR: RPR Ser Ql: NONREACTIVE

## 2024-01-07 ENCOUNTER — Inpatient Hospital Stay (HOSPITAL_COMMUNITY)
Admission: RE | Admit: 2024-01-07 | Discharge: 2024-01-09 | DRG: 785 | Disposition: A | Discharge status: Home / Self Care | Payer: MEDICAID | Attending: Family Medicine | Admitting: Family Medicine

## 2024-01-07 ENCOUNTER — Inpatient Hospital Stay (HOSPITAL_COMMUNITY): Payer: MEDICAID | Admitting: Anesthesiology

## 2024-01-07 ENCOUNTER — Encounter (HOSPITAL_COMMUNITY)
Admission: RE | Disposition: A | Discharge status: Home / Self Care | Payer: Self-pay | Source: Home / Self Care | Attending: Family Medicine

## 2024-01-07 ENCOUNTER — Other Ambulatory Visit: Payer: Self-pay

## 2024-01-07 ENCOUNTER — Encounter (HOSPITAL_COMMUNITY): Payer: Self-pay | Admitting: Family Medicine

## 2024-01-07 DIAGNOSIS — O34219 Maternal care for unspecified type scar from previous cesarean delivery: Secondary | ICD-10-CM

## 2024-01-07 DIAGNOSIS — Z833 Family history of diabetes mellitus: Secondary | ICD-10-CM

## 2024-01-07 DIAGNOSIS — Z3A38 38 weeks gestation of pregnancy: Secondary | ICD-10-CM

## 2024-01-07 DIAGNOSIS — Z87891 Personal history of nicotine dependence: Secondary | ICD-10-CM

## 2024-01-07 DIAGNOSIS — O358XX0 Maternal care for other (suspected) fetal abnormality and damage, not applicable or unspecified: Secondary | ICD-10-CM | POA: Diagnosis present

## 2024-01-07 DIAGNOSIS — O35BXX0 Maternal care for other (suspected) fetal abnormality and damage, fetal cardiac anomalies, not applicable or unspecified: Secondary | ICD-10-CM | POA: Diagnosis present

## 2024-01-07 DIAGNOSIS — O321XX0 Maternal care for breech presentation, not applicable or unspecified: Secondary | ICD-10-CM

## 2024-01-07 DIAGNOSIS — Z302 Encounter for sterilization: Secondary | ICD-10-CM

## 2024-01-07 DIAGNOSIS — O34211 Maternal care for low transverse scar from previous cesarean delivery: Principal | ICD-10-CM | POA: Diagnosis present

## 2024-01-07 DIAGNOSIS — Z8759 Personal history of other complications of pregnancy, childbirth and the puerperium: Secondary | ICD-10-CM

## 2024-01-07 DIAGNOSIS — O09299 Supervision of pregnancy with other poor reproductive or obstetric history, unspecified trimester: Secondary | ICD-10-CM

## 2024-01-07 DIAGNOSIS — Z8249 Family history of ischemic heart disease and other diseases of the circulatory system: Secondary | ICD-10-CM

## 2024-01-07 DIAGNOSIS — O3403 Maternal care for unspecified congenital malformation of uterus, third trimester: Secondary | ICD-10-CM

## 2024-01-07 DIAGNOSIS — Z98891 History of uterine scar from previous surgery: Principal | ICD-10-CM

## 2024-01-07 DIAGNOSIS — Q513 Bicornate uterus: Secondary | ICD-10-CM

## 2024-01-07 DIAGNOSIS — O365930 Maternal care for other known or suspected poor fetal growth, third trimester, not applicable or unspecified: Secondary | ICD-10-CM | POA: Diagnosis present

## 2024-01-07 DIAGNOSIS — O3430 Maternal care for cervical incompetence, unspecified trimester: Secondary | ICD-10-CM | POA: Diagnosis present

## 2024-01-07 HISTORY — PX: TUBAL LIGATION: SHX77

## 2024-01-07 HISTORY — PX: REMOVAL, CERCLAGE SUTURE, WITH ANESTHESIA: SHX7632

## 2024-01-07 LAB — CBC
HCT: 38.8 % (ref 36.0–46.0)
Hemoglobin: 13.2 g/dL (ref 12.0–15.0)
MCH: 28.6 pg (ref 26.0–34.0)
MCHC: 34 g/dL (ref 30.0–36.0)
MCV: 84 fL (ref 80.0–100.0)
Platelets: 190 K/uL (ref 150–400)
RBC: 4.62 MIL/uL (ref 3.87–5.11)
RDW: 14.9 % (ref 11.5–15.5)
WBC: 19.6 K/uL — ABNORMAL HIGH (ref 4.0–10.5)
nRBC: 0 % (ref 0.0–0.2)

## 2024-01-07 MED ORDER — ACETAMINOPHEN 160 MG/5ML PO SOLN: 960.0000 mg | Freq: Once | ORAL | Status: DC

## 2024-01-07 MED ORDER — POVIDONE-IODINE 10 % EX SWAB
2.0000 | Freq: Once | CUTANEOUS | Status: AC
  Administered 2024-01-07: 2 via TOPICAL

## 2024-01-07 MED ORDER — MORPHINE SULFATE (PF) 0.5 MG/ML IJ SOLN
INTRAMUSCULAR | Status: AC
  Filled 2024-01-07: qty 10

## 2024-01-07 MED ORDER — OXYTOCIN-SODIUM CHLORIDE 30-0.9 UT/500ML-% IV SOLN
2.5000 [IU]/h | INTRAVENOUS | Status: AC
  Administered 2024-01-07: 2.5 [IU]/h via INTRAVENOUS
  Filled 2024-01-07: qty 500

## 2024-01-07 MED ORDER — FAMOTIDINE 20 MG PO TABS
20.0000 mg | ORAL_TABLET | Freq: Once | ORAL | Status: AC
  Administered 2024-01-07: 20 mg via ORAL

## 2024-01-07 MED ORDER — FAMOTIDINE 20 MG PO TABS
ORAL_TABLET | ORAL | Status: AC
  Filled 2024-01-07: qty 1

## 2024-01-07 MED ORDER — ACETAMINOPHEN 10 MG/ML IV SOLN
INTRAVENOUS | Status: DC | PRN
  Administered 2024-01-07: 1000 mg via INTRAVENOUS

## 2024-01-07 MED ORDER — ACETAMINOPHEN 500 MG PO TABS
1000.0000 mg | ORAL_TABLET | Freq: Four times a day (QID) | ORAL | Status: DC
  Administered 2024-01-07 – 2024-01-09 (×5): 1000 mg via ORAL
  Filled 2024-01-07 (×7): qty 2

## 2024-01-07 MED ORDER — CHLORHEXIDINE GLUCONATE 0.12 % MT SOLN
15.0000 mL | Freq: Once | OROMUCOSAL | Status: AC
Start: 2024-01-07 — End: 2024-01-07
  Administered 2024-01-07: 15 mL via OROMUCOSAL

## 2024-01-07 MED ORDER — KETOROLAC TROMETHAMINE 30 MG/ML IJ SOLN
30.0000 mg | Freq: Four times a day (QID) | INTRAMUSCULAR | Status: AC
  Administered 2024-01-07: 30 mg via INTRAVENOUS
  Filled 2024-01-07 (×3): qty 1

## 2024-01-07 MED ORDER — CEFAZOLIN SODIUM-DEXTROSE 2-4 GM/100ML-% IV SOLN
INTRAVENOUS | Status: AC
Start: 2024-01-07 — End: 2024-01-07
  Filled 2024-01-07: qty 400

## 2024-01-07 MED ORDER — DEXMEDETOMIDINE HCL IN NACL 80 MCG/20ML IV SOLN
INTRAVENOUS | Status: DC | PRN
  Administered 2024-01-07 (×3): 4 ug via INTRAVENOUS
  Administered 2024-01-07: 8 ug via INTRAVENOUS

## 2024-01-07 MED ORDER — OXYTOCIN-SODIUM CHLORIDE 30-0.9 UT/500ML-% IV SOLN
INTRAVENOUS | Status: DC | PRN
  Administered 2024-01-07: 300 mL via INTRAVENOUS

## 2024-01-07 MED ORDER — SIMETHICONE 80 MG PO CHEW
80.0000 mg | CHEWABLE_TABLET | Freq: Three times a day (TID) | ORAL | Status: DC
  Filled 2024-01-07 (×3): qty 1

## 2024-01-07 MED ORDER — WITCH HAZEL-GLYCERIN EX PADS: 1.0000 | MEDICATED_PAD | CUTANEOUS | Status: DC | PRN

## 2024-01-07 MED ORDER — IBUPROFEN 800 MG PO TABS
400.0000 mg | ORAL_TABLET | Freq: Four times a day (QID) | ORAL | Status: DC
  Administered 2024-01-08 – 2024-01-09 (×3): 400 mg via ORAL
  Filled 2024-01-07 (×5): qty 1

## 2024-01-07 MED ORDER — SODIUM CHLORIDE 0.9 % IR SOLN
Status: DC | PRN
  Administered 2024-01-07: 1

## 2024-01-07 MED ORDER — MEASLES, MUMPS & RUBELLA VAC ~~LOC~~ SUSR
0.5000 mL | Freq: Once | SUBCUTANEOUS | Status: DC
Start: 2024-01-08 — End: 2024-01-09

## 2024-01-07 MED ORDER — SOD CITRATE-CITRIC ACID 500-334 MG/5ML PO SOLN: 30.0000 mL | Freq: Once | ORAL | Status: DC

## 2024-01-07 MED ORDER — SCOPOLAMINE 1 MG/3DAYS TD PT72
MEDICATED_PATCH | TRANSDERMAL | Status: AC
Start: 2024-01-07 — End: 2024-01-07
  Filled 2024-01-07: qty 1

## 2024-01-07 MED ORDER — ONDANSETRON HCL 4 MG/2ML IJ SOLN
INTRAMUSCULAR | Status: DC | PRN
  Administered 2024-01-07: 4 mg via INTRAVENOUS

## 2024-01-07 MED ORDER — ORAL CARE MOUTH RINSE: 15.0000 mL | Freq: Once | OROMUCOSAL | Status: AC

## 2024-01-07 MED ORDER — PHENYLEPHRINE HCL (PRESSORS) 10 MG/ML IV SOLN
INTRAVENOUS | Status: DC | PRN
  Administered 2024-01-07 (×2): 80 ug via INTRAVENOUS

## 2024-01-07 MED ORDER — CEFAZOLIN SODIUM-DEXTROSE 2-4 GM/100ML-% IV SOLN
2.0000 g | INTRAVENOUS | Status: AC
  Administered 2024-01-07: 2 g via INTRAVENOUS

## 2024-01-07 MED ORDER — FENTANYL CITRATE (PF) 100 MCG/2ML IJ SOLN
INTRAMUSCULAR | Status: DC | PRN
  Administered 2024-01-07: 35 ug via INTRAVENOUS
  Administered 2024-01-07: 50 ug via INTRAVENOUS

## 2024-01-07 MED ORDER — GABAPENTIN 100 MG PO CAPS
100.0000 mg | ORAL_CAPSULE | Freq: Three times a day (TID) | ORAL | Status: DC
Start: 2024-01-07 — End: 2024-01-09
  Filled 2024-01-07 (×3): qty 1

## 2024-01-07 MED ORDER — COCONUT OIL OIL
1.0000 | TOPICAL_OIL | Status: DC | PRN
  Administered 2024-01-08: 1 via TOPICAL

## 2024-01-07 MED ORDER — ACETAMINOPHEN 500 MG PO TABS: 1000.0000 mg | ORAL_TABLET | Freq: Once | ORAL | Status: DC

## 2024-01-07 MED ORDER — LACTATED RINGERS IV SOLN
INTRAVENOUS | Status: DC
Start: 2024-01-07 — End: 2024-01-07

## 2024-01-07 MED ORDER — ENOXAPARIN SODIUM 60 MG/0.6ML IJ SOSY
50.0000 mg | PREFILLED_SYRINGE | INTRAMUSCULAR | Status: DC
  Administered 2024-01-08: 50 mg via SUBCUTANEOUS
  Filled 2024-01-07: qty 0.6

## 2024-01-07 MED ORDER — ONDANSETRON HCL 4 MG/2ML IJ SOLN
INTRAMUSCULAR | Status: AC
  Filled 2024-01-07: qty 2

## 2024-01-07 MED ORDER — MORPHINE SULFATE (PF) 0.5 MG/ML IJ SOLN
INTRAMUSCULAR | Status: DC | PRN
  Administered 2024-01-07: 150 ug via INTRATHECAL

## 2024-01-07 MED ORDER — FENTANYL CITRATE (PF) 100 MCG/2ML IJ SOLN
INTRAMUSCULAR | Status: DC | PRN
  Administered 2024-01-07: 15 ug via INTRATHECAL

## 2024-01-07 MED ORDER — OXYCODONE HCL 5 MG PO TABS: 5.0000 mg | ORAL_TABLET | ORAL | Status: DC | PRN

## 2024-01-07 MED ORDER — CEFAZOLIN SODIUM-DEXTROSE 2-4 GM/100ML-% IV SOLN
INTRAVENOUS | Status: AC
  Filled 2024-01-07: qty 100

## 2024-01-07 MED ORDER — STERILE WATER FOR IRRIGATION IR SOLN
Status: DC | PRN
  Administered 2024-01-07: 1000 mL

## 2024-01-07 MED ORDER — SOD CITRATE-CITRIC ACID 500-334 MG/5ML PO SOLN
ORAL | Status: AC
  Filled 2024-01-07: qty 30

## 2024-01-07 MED ORDER — PRENATAL MULTIVITAMIN CH
1.0000 | ORAL_TABLET | Freq: Every day | ORAL | Status: DC
  Administered 2024-01-08: 1 via ORAL
  Filled 2024-01-07: qty 1

## 2024-01-07 MED ORDER — TRANEXAMIC ACID-NACL 1000-0.7 MG/100ML-% IV SOLN
INTRAVENOUS | Status: AC
Start: 2024-01-07 — End: 2024-01-07
  Filled 2024-01-07: qty 100

## 2024-01-07 MED ORDER — SODIUM CHLORIDE 0.9 % IV SOLN
INTRAVENOUS | Status: AC
  Filled 2024-01-07 (×2): qty 5

## 2024-01-07 MED ORDER — FENTANYL CITRATE (PF) 100 MCG/2ML IJ SOLN
INTRAMUSCULAR | Status: AC
  Filled 2024-01-07: qty 2

## 2024-01-07 MED ORDER — DIPHENHYDRAMINE HCL 25 MG PO CAPS: 25.0000 mg | ORAL_CAPSULE | Freq: Four times a day (QID) | ORAL | Status: DC | PRN

## 2024-01-07 MED ORDER — LACTATED RINGERS IV SOLN: INTRAVENOUS | Status: DC

## 2024-01-07 MED ORDER — TRANEXAMIC ACID-NACL 1000-0.7 MG/100ML-% IV SOLN
INTRAVENOUS | Status: DC | PRN
  Administered 2024-01-07: 1000 mg via INTRAVENOUS

## 2024-01-07 MED ORDER — SENNOSIDES-DOCUSATE SODIUM 8.6-50 MG PO TABS
2.0000 | ORAL_TABLET | Freq: Every day | ORAL | Status: DC
  Filled 2024-01-07 (×2): qty 2

## 2024-01-07 MED ORDER — SIMETHICONE 80 MG PO CHEW: 80.0000 mg | CHEWABLE_TABLET | ORAL | Status: DC | PRN

## 2024-01-07 MED ORDER — DIBUCAINE (PERIANAL) 1 % EX OINT: 1.0000 | TOPICAL_OINTMENT | CUTANEOUS | Status: DC | PRN

## 2024-01-07 MED ORDER — SCOPOLAMINE 1 MG/3DAYS TD PT72
1.0000 | MEDICATED_PATCH | Freq: Once | TRANSDERMAL | Status: DC
  Administered 2024-01-07: 1 mg via TRANSDERMAL

## 2024-01-07 MED ORDER — DEXMEDETOMIDINE HCL IN NACL 80 MCG/20ML IV SOLN
INTRAVENOUS | Status: AC
  Filled 2024-01-07: qty 20

## 2024-01-07 MED ORDER — PHENYLEPHRINE HCL-NACL 20-0.9 MG/250ML-% IV SOLN
INTRAVENOUS | Status: DC | PRN
  Administered 2024-01-07: 60 ug/min via INTRAVENOUS

## 2024-01-07 MED ORDER — MENTHOL 3 MG MT LOZG: 1.0000 | LOZENGE | OROMUCOSAL | Status: DC | PRN

## 2024-01-07 MED ORDER — DEXAMETHASONE SOD PHOSPHATE PF 10 MG/ML IJ SOLN
INTRAMUSCULAR | Status: DC | PRN
  Administered 2024-01-07: 10 mg via INTRAVENOUS

## 2024-01-07 MED ORDER — CHLORHEXIDINE GLUCONATE 0.12 % MT SOLN
OROMUCOSAL | Status: AC
  Filled 2024-01-07: qty 15

## 2024-01-07 MED ORDER — BUPIVACAINE IN DEXTROSE 0.75-8.25 % IT SOLN
INTRATHECAL | Status: DC | PRN
Start: 2024-01-07 — End: 2024-01-07
  Administered 2024-01-07: 1.6 mL via INTRATHECAL

## 2024-01-07 SURGICAL SUPPLY — 1 items: NDL HYPO 25X5/8 SAFETYGLIDE (NEEDLE) IMPLANT

## 2024-01-07 NOTE — Discharge Summary (Shared)
 Postpartum Discharge Summary  Date of Service updated***     Patient Name: Carolyn Joseph DOB: Jul 07, 1990 MRN: [id number]  Date of admission: 01/07/2024 Delivery date:01/07/2024 Delivering provider: ILEAN NORLEEN GAILS Date of discharge: 01/07/2024  Admitting diagnosis: Maternal care due to low transverse uterine scar from previous cesarean delivery [O34.211] Poor fetal growth affecting management of mother in third trimester, single or unspecified fetus [O36.5930] Encounter for sterilization [Z30.2] Status post cesarean delivery [Z98.891] Intrauterine pregnancy: [redacted]w[redacted]d     Secondary diagnosis:  Principal Problem:   Status post cesarean delivery Active Problems:   Bicornate uterus complicating pregnancy   History of cerclage, currently pregnant   History of prior pregnancy with IUGR newborn   History of cesarean delivery   Cervical cerclage suture present-placed 07/23/23   Poor fetal growth affecting management of mother in third trimester   Ventricular septal defect (VSD) of fetus affecting management of pregnancy   Encounter for sterilization  Additional problems: ***    Discharge diagnosis: {DX.:23714}                                              Post partum procedures:{Postpartum procedures:23558} Augmentation: N/A Complications: None  Hospital course: Sceduled C/S   33 y.o. yo H3E6966 at [redacted]w[redacted]d was admitted to the hospital 01/07/2024 for scheduled cesarean section with the following indication:Elective Repeat, Malpresentation, and Prior Uterine Surgery.Delivery details are as follows:  Membrane Rupture Time/Date: 11:17 AM,01/07/2024  Delivery Method:C-Section, Low Transverse Operative Delivery:N/A Details of operation can be found in separate operative note.    Patient had a postpartum course complicated by***.  She is ambulating, tolerating a regular diet, passing flatus, and urinating well. Patient is discharged home in stable condition on  01/07/24         Newborn Data: Birth date:01/07/2024 Birth time:11:20 AM Gender:Female Living status:Living Apgars:9 ,9  Weight:2520 g    Magnesium Sulfate received: {Mag received:30440022} BMZ received: No Rhophylac:N/A MMR:N/A T-DaP:{Tdap:23962} Flu: {Qol:76036} RSV Vaccine received: No Transfusion:{Transfusion received:30440034}  Immunizations received: There is no immunization history for the selected administration types on file for this patient.  Physical exam  Vitals:   01/07/24 0754  Pulse: 100  Resp: 18  Temp: 98.4 F (36.9 C)  SpO2: 97%  Weight: 94.8 kg  Height: 5' 5 (1.651 m)   General: {Exam; general:21111117} Lochia: {Desc; appropriate/inappropriate:30686::appropriate} Uterine Fundus: {Desc; firm/soft:30687} Incision: {Exam; incision:21111123} DVT Evaluation: {Exam; icu:7888877} Labs: Lab Results  Component Value Date   WBC 8.8 01/06/2024   HGB 13.1 01/06/2024   HCT 40.4 01/06/2024   MCV 85.6 01/06/2024   PLT 194 01/06/2024      Latest Ref Rng & Units 12/09/2019    5:54 AM  CMP  Creatinine 0.44 - 1.00 mg/dL 9.39    Edinburgh Score:    12/11/2019    7:43 AM  Edinburgh Postnatal Depression Scale Screening Tool  I have been able to laugh and see the funny side of things. 0  I have looked forward with enjoyment to things. 0  I have blamed myself unnecessarily when things went wrong. 1  I have been anxious or worried for no good reason. 0  I have felt scared or panicky for no good reason. 1  Things have been getting on top of me. 1  I have been so unhappy that I have had difficulty sleeping. 1  I  have felt sad or miserable. 0  I have been so unhappy that I have been crying. 0  The thought of harming myself has occurred to me. 0  Edinburgh Postnatal Depression Scale Total 4      Data saved with a previous flowsheet row definition   No data recorded  After visit meds:  Allergies as of 01/07/2024   Not on File   Med Rec must be completed prior to  using this [facility]***        Discharge home in stable condition Infant Feeding: {Baby feeding:23562} Infant Disposition:{CHL IP OB HOME WITH FNUYZM:76418} Discharge instruction: per After Visit Summary and Postpartum booklet. Activity: Advance as tolerated. Pelvic rest for 6 weeks.  Diet: {OB ipzu:78888878} Future Appointments: Future Appointments  Date Time Provider Department Center  01/10/2024 10:55 AM Emilio Delilah CHRISTELLA EDDY [facility]  01/17/2024  8:35 AM Ilean Norleen GAILS, MD [facility]  01/23/2024  4:15 PM Eveline Lynwood MATSU, MD [facility]   Follow up Visit: Message sent 01/07/2024   Please schedule this patient for a In person postpartum visit in 6 weeks with the following provider: Any provider. Additional Postpartum F/U:Incision check 1 week  High risk pregnancy complicated by: FGR, malpresentation,  Delivery mode:  C-Section, Low Transverse Anticipated Birth Control:  BTL done intra-op   01/07/2024 Barabara Maier, DO

## 2024-01-07 NOTE — Anesthesia Procedure Notes (Signed)
 Spinal  Patient location during procedure: OR Start time: 01/07/2024 10:20 AM End time: 01/07/2024 10:25 AM Reason for block: surgical anesthesia Staffing Performed: anesthesiologist  Anesthesiologist: Lucious Debby BRAVO, MD Performed by: Lucious Debby BRAVO, MD Authorized by: Lucious Debby BRAVO, MD   Preanesthetic Checklist Completed: patient identified, IV checked, risks and benefits discussed, surgical consent, monitors and equipment checked, pre-op evaluation and timeout performed Spinal Block Patient position: sitting Prep: DuraPrep Patient monitoring: heart rate, cardiac monitor, continuous pulse ox and blood pressure Approach: midline Location: L2-3 Injection technique: single-shot Needle Needle type: Pencan  Needle gauge: 24 G Assessment Events: second provider Additional Notes Consent was obtained prior to the procedure with all questions answered and concerns addressed. Risks including, but not limited to, bleeding, infection, nerve damage, paralysis, failed block, inadequate analgesia, allergic reaction, high spinal, itching, and headache were discussed and the patient wished to proceed. Functioning IV was confirmed and monitors were applied. Sterile prep and drape, including hand hygiene, mask, and sterile gloves were used. The patient was positioned and the spine was prepped. The skin was anesthetized with lidocaine . After failed attempt by SRNA, Dr. Lucious successful on first attempt. Free flow of clear CSF was obtained prior to injecting local anesthetic into the CSF. The spinal needle aspirated freely following injection. The needle was carefully withdrawn. The patient tolerated the procedure well.   Debby Lucious, MD

## 2024-01-07 NOTE — Op Note (Signed)
 Cesarean Section Operative Note   Patient: Carolyn Joseph  Date of Procedure: 01/07/2024  Procedure: Repeat Low Transverse Cesarean and Bilateral Tubal Ligation via Pomeroy method   Indications: malpresentation: breech   and previous uterine incision: low transverse  Pre-operative Diagnosis: previous cesarean.   Post-operative Diagnosis: Same  TOLAC Candidate: No  Surgeon: Surgeons and Role:    * Cresenzo, Norleen GAILS, MD - Primary    * Danny Geralds, [place] - Fellow    * Eveline Lynwood MATSU, MD  Assistants: An experienced assistant was required given the standard of surgical care given the complexity of the case.  This assistant was needed for exposure, dissection, suctioning, retraction, instrument exchange, assisting with delivery with administration of fundal pressure, and for overall help during the procedure.   Anesthesia: spinal  Anesthesiologist: Lucious Debby BRAVO, MD   Antibiotics: Cefazolin    QBL:  Total IV Fluids: 2000 ml  Urine Output: 900 cc OF clear urine  Specimens: b/l Fallopian tubes, placenta   Complications: no complications   Indications: Carolyn Joseph is a 33 y.o. [phone] with an IUP [redacted]w[redacted]d presenting for scheduled cesarean secondary to the indications listed above. Clinical course notable for FGR 4% with VSD and breech presentation.  The risks of cesarean section were discussed with the patient including but were not limited to: bleeding which may require transfusion or reoperation; infection which may require antibiotics; injury to bowel, bladder, ureters or other surrounding organs; injury to the fetus; need for additional procedures including hysterectomy in the event of a life-threatening hemorrhage; placental abnormalities wth subsequent pregnancies, incisional problems, thromboembolic phenomenon and other postoperative/anesthesia complications.  Patient also desires permanent sterilization.  Other reversible forms of contraception  were discussed with patient; she declines all other modalities. Risks of procedure discussed with patient including but not limited to: risk of regret, permanence of method, bleeding, infection, injury to surrounding organs and need for additional procedures.  Failure risk of about 1% with increased risk of ectopic gestation if pregnancy occurs was also discussed with patient.  Also discussed possibility of post-tubal pain syndrome. The patient concurred with the proposed plan, giving informed written consent for the procedures.  Patient has been NPO since last night she will remain NPO for procedure. Anesthesia and OR aware.  Preoperative prophylactic antibiotics and SCDs ordered on call to the OR.   Findings: Viable infant in breech presentation, nuchal x2. Apgars 9, 9, . Weight 2520 g. Clear amniotic fluid. Normal placenta, three vessel cord. bicornaute uterus, Normal bilateral fallopian tubes, Normal bilateral ovaries. Moderate adhesive disease,  .  Procedure Details: A Time Out was held and the above information confirmed. The patient received intravenous antibiotics and had sequential compression devices applied to her lower extremities preoperatively. The patient was taken back to the operative suite where spinal anesthesia was administered. After induction of anesthesia, the patient was draped and prepped in the usual sterile manner and placed in a dorsal supine position with a leftward tilt. A low transverse skin incision was made with scalpel and carried down through the subcutaneous tissue to the fascia. Fascial incision was made and extended transversely. The fascia was separated from the underlying rectus tissue superiorly and inferiorly. The rectus muscles were separated in the midline bluntly and the peritoneum was entered bluntly. An Alexis retractor was placed to aid in visualization of the uterus. A bladder flap was not developed. A low transverse uterine incision was made. The infant was  successfully delivered from breech presentation, the  umbilical cord was clamped after 30sec. Cord ph was sent, and cord blood was obtained for evaluation. The placenta was removed Intact after cord was avulsed and appeared normal. The uterine incision was closed with a single layer running unlocked suture of 0-Monocryl. Overall, excellent hemostasis was noted.  Attention was then turned to the fallopian tubes. A Kelly clamp was placed across the left fallopian tube taking care to incorporate the fimbriae. A second clamp was then placed below the first. The fallopian tube was then removed with Metzenbaum scissors. The pedicle was then ligated with 2-0 Monocryl suture beneath the second clamp, after which the second clamp was removed with excellent hemostasis noted. Then a second ligature of 2-0 Monocryl suture was placed between the first suture and the remaining clamp. The remaining clamp was then removed and again excellent hemostasis was observed. The same procedure was then carried out on the right fallopian tube with excellent hemostasis noted. .  The abdomen and pelvis were cleared of all clot and debris and the Thersia was removed. Hemostasis was confirmed on all surfaces.  The peritoneum was was not reapproximated. The fascia was then closed using 0 Vicryl in a running fashion. The subcutaneous layer was reapproximated with 2-0 plain gut suture. The skin was closed with a 4-0 vicryl subcuticular stitch. The patient tolerated the procedure well. Sponge, lap, instrument and needle counts were correct x 2. She was taken to the recovery room in stable condition.  Disposition: PACU - hemodynamically stable.   Barabara Maier, DO FMOB Fellow, Faculty Practice [facility], Center for [employer]

## 2024-01-07 NOTE — Transfer of Care (Signed)
 Immediate Anesthesia Transfer of Care Note  Patient: Carolyn Joseph  Procedure(s) Performed: CESAREAN DELIVERY (Bilateral: Abdomen) REMOVAL, CERCLAGE SUTURE, WITH ANESTHESIA LIGATION, FALLOPIAN TUBE, BILATERAL (Bilateral)  Patient Location: PACU  Anesthesia Type:Spinal  Level of Consciousness: awake, alert , and oriented  Airway & Oxygen Therapy: Patient Spontanous Breathing  Post-op Assessment: Report given to RN and Post -op Vital signs reviewed and stable  Post vital signs: Reviewed and stable  Last Vitals:  Vitals Value Taken Time  BP 116/65 01/07/24 12:47  Temp    Pulse 83 01/07/24 12:50  Resp 20 01/07/24 12:50  SpO2 97 % 01/07/24 12:50  Vitals shown include unfiled device data.  Last Pain: There were no vitals filed for this visit.    Patients Stated Pain Goal: 5 (01/07/24 0800)  Complications: No notable events documented.

## 2024-01-07 NOTE — H&P (Signed)
 Obstetric Preoperative History and Physical  Carolyn Joseph is a 33 y.o. [id number] with IUP at [redacted]w[redacted]d presenting for scheduled cesarean section.  Reports good fetal movement, no bleeding, no contractions, no leaking of fluid.  No acute preoperative concerns.    Cesarean Section Indication: malpresentation: breech and previous uterine incision kerr x2  Prenatal Course Source of Care: [facility]  with onset of care at 11 weeks Pregnancy complications or risks: Patient Active Problem List   Diagnosis Date Noted   Status post cesarean delivery 01/07/2024   Encounter for sterilization 01/07/2024   Ventricular septal defect (VSD) of fetus affecting management of pregnancy 01/02/2024   Poor fetal growth affecting management of mother in third trimester 09/10/2023   Cervical cerclage suture present-placed 07/23/23 08/26/2023   History of incompetent cervix, currently pregnant in second trimester 07/23/2023   Supervision of high risk pregnancy, antepartum 07/04/2023   Intrauterine pregnancy 06/09/2023   History of cesarean delivery 12/08/2019   History of cerclage, currently pregnant 05/21/2019   History of prior pregnancy with IUGR newborn 05/21/2019   Bicornate uterus complicating pregnancy 11/18/2017   She plans to breastfeed, plans to bottle feed She desires bilateral tubal ligation for postpartum contraception.   Prenatal labs and studies: ABO, Rh: --/--/O POS (10/13 0920) Antibody: NEG (10/13 0920) Rubella: 2.24 (04/16 1530) RPR: NON REACTIVE (10/13 0913)  HBsAg: Negative (04/16 1530)  HIV: Non Reactive (08/08 1012)  GBS:  2hr Glucola: WNL Genetic screening normal Anatomy US  abnormal with fetal VSD, IUGR, bicornuate uterus  Prenatal Transfer Tool  Maternal Diabetes: No Genetic Screening: Normal Maternal Ultrasounds/Referrals: Fetal Heart Anomalies Fetal Ultrasounds or other Referrals:  Fetal echo [facility] report in media tab, 10/23/2023 Maternal Substance Abuse:  No Significant  Maternal Medications:  None Significant Maternal Lab Results: Other:  GBS [place]  Past Medical History:  Diagnosis Date   Anemia    Bicornate uterus    Hip fracture (HCC) 2015   History of cesarean delivery 05/25/2019   LTCS 3/20 at 37 weeks d/t breech and IUGR   History of incompetent cervix, currently pregnant 05/21/2019   Cerclage placed 06/16/19 by Dr Alger   Hypotension    Ovarian cyst    Supervision of high risk pregnancy, antepartum 05/21/2019    Nursing Staff Provider Office Location  [facility] Dating  10 wk US  Language   English Anatomy US   normal Flu Vaccine   Genetic Screen  NIPS: negative  AFP:  negative First Screen:  Quad:   TDaP vaccine    Hgb A1C or  GTT Early  Third trimester Nml 2 hr GTT Rhogam     LAB RESULTS  Feeding Plan  Breast  Blood Type --/--/O POS (03/17 0205)  Contraception  Antibody NEG (03/17 0205) Circumcision  Rubel    Past Surgical History:  Procedure Laterality Date   CERVICAL CERCLAGE N/A 01/20/2018   Procedure: CERCLAGE CERVICAL;  Surgeon: Lorence Ozell CROME, MD;  [facility]: [facility];  Service: Gynecology;  Laterality: N/A;   CERVICAL CERCLAGE N/A 06/16/2019   Procedure: CERCLAGE CERVICAL;  Surgeon: Alger Gong, MD;  Location: [facility];  Service: Gynecology;  Laterality: N/A;   CERVICAL CERCLAGE N/A 07/23/2023   Procedure: CERCLAGE, CERVIX, VAGINAL APPROACH;  Surgeon: Alger Gong, MD;  Location: [facility];  Service: Obstetrics;  Laterality: N/A;   CESAREAN SECTION N/A 06/10/2018   Procedure: CESAREAN SECTION;  Surgeon: Eveline Lynwood MATSU, MD;  Location: [facility];  Service: Obstetrics;  Laterality: N/A;   CESAREAN SECTION  12/08/2019   Procedure: CESAREAN SECTION;  Surgeon: Trudy Rozelle DASEN, MD;  Location: [facility];  Service: Obstetrics;;   HIP FRACTURE SURGERY Bilateral 2015    OB History  Gravida Para Term Preterm AB Living  6 2 2  0 3 2  SAB IAB Ectopic Multiple Live Births  2 0 0 0 2    # Outcome Date GA Lbr Len/2nd Weight Sex Type  Anes PTL Lv  6 Current           5 AB 08/14/22 [redacted]w[redacted]d    SAB     4 Term 12/08/19 [redacted]w[redacted]d  2790 g F CS-LTranv Spinal  LIV  3 Term 06/10/18 [redacted]w[redacted]d  2095 g F CS-LTranv Spinal  LIV     Birth Comments: IUGR, c/s for breach  2 SAB 08/13/15 [redacted]w[redacted]d         1 SAB 01/12/14 [redacted]w[redacted]d      N FD    Social History   Socioeconomic History   Marital status: Married    Spouse name: Not on file   Number of children: 2   Years of education: Not on file   Highest education level: High school graduate  Occupational History   Not on file  Tobacco Use   Smoking status: Former    Current packs/day: 0.00    Types: Cigarettes    Quit date: 2015    Years since quitting: 10.7   Smokeless tobacco: Never   Tobacco comments:    in high school  Vaping Use   Vaping status: Never Used  Substance and Sexual Activity   Alcohol use: Not Currently   Drug use: Never   Sexual activity: Not Currently    Birth control/protection: Condom  Other Topics Concern   Not on file  Social History Narrative   Not on file   Social Drivers of Health   Financial Resource Strain: Not on file  Food Insecurity: No Food Insecurity (01/07/2024)   Hunger Vital Sign    Worried About Running Out of Food in the Last Year: Never true    Ran Out of Food in the Last Year: Never true  Transportation Needs: No Transportation Needs (01/07/2024)   PRAPARE - Administrator, Civil Service (Medical): No    Lack of Transportation (Non-Medical): No  Physical Activity: Not on file  Stress: Not on file  Social Connections: Not on file    Family History  Problem Relation Age of Onset   Heart disease Mother    Diabetes Mother    Hypertension Mother    Diabetes Maternal Grandmother    Asthma Neg Hx    Cancer Neg Hx    Stroke Neg Hx     Medications Prior to Admission  Medication Sig Dispense Refill Last Dose/Taking   Prenatal Vit-Fe Fumarate-FA (PRENATAL MULTIVITAMIN) TABS tablet Take 1 tablet by mouth daily at 12 noon.    01/06/2024    Not on File  Review of Systems: Pertinent items noted in HPI and remainder of comprehensive ROS otherwise negative.  Physical Exam: Pulse 100   Temp 98.4 F (36.9 C)   Resp 18   Ht 5' 5 (1.651 m)   Wt 94.8 kg   LMP 04/08/2023 (Exact Date)   SpO2 97%   BMI 34.78 kg/m  FHR by Doppler: 130 bpm CONSTITUTIONAL: Well-developed, well-nourished female in no acute distress.  SKIN: Skin is warm and dry. No rash noted. Not diaphoretic. No erythema. No pallor. CARDIOVASCULAR: Normal heart rate noted RESPIRATORY:  Effort and external breath sounds normal ABDOMEN: Soft, nontender, nondistended, gravid. Well-healed Pfannenstiel incision. PELVIC: Deferred  Pertinent Labs/Studies:   Results for orders placed or performed during the hospital encounter of 01/06/24 (from the past 72 hours)  CBC     Status: None   Collection Time: 01/06/24  9:13 AM  Result Value Ref Range   WBC 8.8 4.0 - 10.5 K/uL   RBC 4.72 3.87 - 5.11 MIL/uL   Hemoglobin 13.1 12.0 - 15.0 g/dL   HCT 59.5 63.9 - 53.9 %   MCV 85.6 80.0 - 100.0 fL   MCH 27.8 26.0 - 34.0 pg   MCHC 32.4 30.0 - 36.0 g/dL   RDW 84.8 88.4 - 84.4 %   Platelets 194 150 - 400 K/uL   nRBC 0.0 0.0 - 0.2 %    Comment: Performed at [facility], [address] [place], [place] [address]  RPR     Status: None   Collection Time: 01/06/24  9:13 AM  Result Value Ref Range   RPR Ser Ql NON REACTIVE NON REACTIVE    Comment: Performed at [facility], [address], [place], [place] [address]  Type and screen     Status: None   Collection Time: 01/06/24  9:20 AM  Result Value Ref Range   ABO/RH(D) O POS    Antibody Screen NEG    Sample Expiration      01/09/2024,2359 Performed at [facility], [address] [place], [place] [address]     Assessment and Plan: Carolyn Joseph is a 33 y.o. [id number] at [redacted]w[redacted]d being admitted for scheduled cesarean section.   The risks of cesarean section were discussed with  the patient including but were not limited to: bleeding which may require transfusion or reoperation; infection which may require antibiotics; injury to bowel, bladder, ureters or other surrounding organs; injury to the fetus; need for additional procedures including hysterectomy in the event of a life-threatening hemorrhage; placental abnormalities wth subsequent pregnancies, incisional problems, thromboembolic phenomenon and other postoperative/anesthesia complications.  Patient also desires permanent sterilization.  Other reversible forms of contraception were discussed with patient; she declines all other modalities. Risks of procedure discussed with patient including but not limited to: risk of regret, permanence of method, bleeding, infection, injury to surrounding organs and need for additional procedures.  Failure risk of about 1% with increased risk of ectopic gestation if pregnancy occurs was also discussed with patient.  Also discussed possibility of post-tubal pain syndrome. The patient concurred with the proposed plan, giving informed written consent for the procedures.  Patient has been NPO since last night she will remain NPO for procedure. Anesthesia and OR aware.  Preoperative prophylactic antibiotics and SCDs ordered on call to the OR.      Barabara Maier, DO FMOB Fellow, Faculty Practice [place], Center for [facility]  Attestation of Attending Supervision of Obstetric Fellow: Evaluation and management procedures were performed by the Obstetric Fellow under my supervision and collaboration.  I have reviewed the Obstetric Fellow's note and chart, and I agree with the management and plan. I have also made any necessary editorial changes.   Catina Nuss V Gennaro Lizotte, MD Attending Physician, [facility] for [facility], [facility] 01/07/2024 10:10 AM

## 2024-01-08 ENCOUNTER — Encounter (HOSPITAL_COMMUNITY): Payer: Self-pay | Admitting: Family Medicine

## 2024-01-08 LAB — CBC
HCT: 37.1 % (ref 36.0–46.0)
Hemoglobin: 12.2 g/dL (ref 12.0–15.0)
MCH: 27.6 pg (ref 26.0–34.0)
MCHC: 32.9 g/dL (ref 30.0–36.0)
MCV: 83.9 fL (ref 80.0–100.0)
Platelets: 192 K/uL (ref 150–400)
RBC: 4.42 MIL/uL (ref 3.87–5.11)
RDW: 15.1 % (ref 11.5–15.5)
WBC: 14.8 K/uL — ABNORMAL HIGH (ref 4.0–10.5)
nRBC: 0 % (ref 0.0–0.2)

## 2024-01-08 MED ORDER — IBUPROFEN 800 MG PO TABS
ORAL_TABLET | ORAL | Status: AC
  Filled 2024-01-08: qty 1

## 2024-01-08 MED ORDER — ACETAMINOPHEN 500 MG PO TABS
ORAL_TABLET | ORAL | Status: AC
Start: 2024-01-08 — End: 2024-01-08
  Filled 2024-01-08: qty 2

## 2024-01-08 NOTE — Anesthesia Postprocedure Evaluation (Signed)
 Anesthesia Post Note  Patient: Carolyn Joseph  Procedure(s) Performed: CESAREAN DELIVERY (Bilateral: Abdomen) REMOVAL, CERCLAGE SUTURE, WITH ANESTHESIA LIGATION, FALLOPIAN TUBE, BILATERAL (Bilateral)     Patient location during evaluation: PACU Anesthesia Type: Spinal Level of consciousness: awake and alert Pain management: pain level controlled Vital Signs Assessment: post-procedure vital signs reviewed and stable Respiratory status: spontaneous breathing and respiratory function stable Cardiovascular status: blood pressure returned to baseline and stable Postop Assessment: spinal receding and no apparent nausea or vomiting Anesthetic complications: no   No notable events documented.  Last Vitals:  Vitals:   01/08/24 0100 01/08/24 0459  BP: 112/74 115/75  Pulse: 78 79  Resp: 16 16  Temp: 37.1 C 36.6 C  SpO2: 97% 98%    Last Pain:  Vitals:   01/08/24 0745  TempSrc:   PainSc: 3                  Debby FORBES Like

## 2024-01-08 NOTE — Progress Notes (Signed)
 POSTPARTUM PROGRESS NOTE POD #1  Subjective:  Carolyn Joseph is a 33 y.o. [id number] s/p POD #1 rLTCS at [redacted]w[redacted]d. No acute events overnight. She reports she is doing well. She denies any problems with ambulating, voiding or po intake. Denies nausea or vomiting. She has not yet passed flatus. Pain is moderately controlled.  Lochia is appropriate. She says her abdomen is getting hard and is concerned about bleeding from the incision site.  Objective: Blood pressure 119/70, pulse 77, temperature 98 F (36.7 C), temperature source Oral, resp. rate 16, height 5' 5 (1.651 m), weight 94.8 kg, last menstrual period 04/08/2023, SpO2 98%, unknown if currently breastfeeding.  Physical Exam:  General: alert, cooperative and no distress Heart: regular rate Resp: nonlabored Uterine Fundus: firm, appropriately tender Extremities: no edema noted bilaterally Skin: warm, dry; incision intact w/ honeycomb dressing in place and evidence of moderate bleeding  Recent Labs    01/07/24 1436 01/08/24 0556  HGB 13.2 12.2  HCT 38.8 37.1    Assessment/Plan: Carolyn Joseph is a 33 y.o. H3E6966 s/p POD #1 rLTCS at [redacted]w[redacted]d.  POD#1  - Doing well; pain moderately controlled.  - Routine postpartum care - Lovenox  for VTE prophylaxis Feeding: breast and bottle feeding Contraception: BTL done 01/07/2024   Dispo: Plan for discharge POD#2.  LOS: 1 day   Burnard Rummer, PA-S   ATTENDING ATTESTATION  I have seen and examined this patient and agree with the above documentation in the PA student's note except as below.  I have edited above note for accuracy and clarity  Donnice CHRISTELLA Carolus, MD/[facility] Center for [employer] (Faculty Practice) 01/08/2024, 4:30 PM

## 2024-01-08 NOTE — Lactation Note (Signed)
 This note was copied from a baby's chart. Lactation Consultation Note  Patient Name: Carolyn Joseph Unijb'd Date: 01/08/2024 Age:31 hours Reason for consult: Initial assessment;Early term 37-38.6wks;Infant < 6lbs.See MOB: MR- C/S delivery.   P3, ETI female infant, Per MOB, infant breastfeed for 20 minutes prior to [facility] entering the room, MOB gave infant 3 mls of pumped EBM, MOB was set up with DEBP and infant was given 10 mls of 22 kcal formula, MOB will limit breastfeeding to 15 minutes and then afterwards supplement infant with any EBM first and then formula. MOB will limit infant's total feeding to 30 minutes or less. MOB will follow the LBW/ LPTI feeding guidelines and feed infant every 3 hours. MOB is experienced with breastfeeding see maternal data below.   MOB knows that her EBM is safe for 4 hours whereas formula RTF once open is only safe for 1 hour.   Day 1 feeding plan: LBW/LPTI feeding guidelines 1- Breastfeed first every feeding every 3 hours and limit total feedings to 30 minutes or less, breastfeeding and bottle feeding. 2- MOB will offer any EBM first and then formula after each feeding, Day 1 MOB will offer (12-14 mls) per feeding. 3- MOB will continue to pump every 3 hours for 15 minutes on initial setting.   Maternal Data Does the patient have breastfeeding experience prior to this delivery?: Yes How long did the patient breastfeed?: Per MOB, she breastfeed her 1st child for one year and 2nd child for 18 months.  Feeding Mother's Current Feeding Choice: Breast Milk and Formula  LATCH Score                    Lactation Tools Discussed/Used Tools: Pump;Flanges Flange Size: 21 Breast pump type: Double-Electric Breast Pump Pump Education: Setup, frequency, and cleaning;Milk Storage Reason for Pumping: Infant is ETI infant and 5 lbs 8 ounces Pumping frequency: MOB will continue to use the DEBP every 3 hours for 15 minutes on inital  setting. Pumped volume: 3 mL  Interventions Interventions: Breast feeding basics reviewed;Skin to skin;DEBP;Hand pump;Pace feeding;LC Services brochure;LPT handout/interventions;CDC milk storage guidelines;CDC Guidelines for Breast Pump Cleaning;Guidelines for Milk Supply and Pumping Schedule Handout  Discharge    Consult Status Consult Status: Follow-up Date: 01/08/24 Follow-up type: In-patient    Grayce LULLA Batter 01/08/2024, 12:22 AM

## 2024-01-08 NOTE — Lactation Note (Addendum)
 This note was copied from a baby's chart. Lactation Consultation Note  Patient Name: Carolyn Joseph Unijb'd Date: 01/08/2024 Age:31 hours Reason for consult: Follow-up assessment;Early term 37-38.6wks;Infant < 6lbs  P3. Mom stated she is putting baby to the breast some then giving formula. Mainly giving formula because she doesn't thing she has anything since when she pumps she doesn't collect anything. She has pumped some but not every 3 hrs. Because she doesn't get anything when she pumps. Encouraged to pump for extra stimulation even though she isn't getting anything. It's normal not to get anything yet. Mom stated ok. Reminded mom to limit BF to 10 min. To conserve energy. Mom stated OK.  Mom had her other 2 daughters in rm and was a lot of activity at this time. Baby was sleeping so mom was spending time w/her daughters. Encouraged mom to call for latch assistance if needed. Maternal Data    Feeding Nipple Type: Nfant Slow Flow (purple)  LATCH Score       Type of Nipple: Everted at rest and after stimulation            Lactation Tools Discussed/Used    Interventions Interventions: Breast feeding basics reviewed;Education  Discharge    Consult Status Consult Status: Follow-up Date: 01/09/24 Follow-up type: In-patient    Anikin Prosser G 01/08/2024, 9:00 PM

## 2024-01-09 ENCOUNTER — Encounter: Payer: Self-pay | Admitting: Lactation Services

## 2024-01-09 ENCOUNTER — Other Ambulatory Visit (HOSPITAL_COMMUNITY): Payer: Self-pay

## 2024-01-09 LAB — SURGICAL PATHOLOGY

## 2024-01-09 MED ORDER — SENNOSIDES-DOCUSATE SODIUM 8.6-50 MG PO TABS
2.0000 | ORAL_TABLET | Freq: Every evening | ORAL | 0 refills | Status: AC | PRN
  Filled 2024-01-09: qty 30, 15d supply, fill #0

## 2024-01-09 MED ORDER — ACETAMINOPHEN 500 MG PO TABS
1000.0000 mg | ORAL_TABLET | Freq: Four times a day (QID) | ORAL | 0 refills | Status: AC | PRN
  Filled 2024-01-09: qty 30, 4d supply, fill #0

## 2024-01-09 MED ORDER — OXYCODONE HCL 5 MG PO TABS
5.0000 mg | ORAL_TABLET | Freq: Four times a day (QID) | ORAL | 0 refills | Status: AC | PRN
  Filled 2024-01-09: qty 20, 5d supply, fill #0

## 2024-01-09 MED ORDER — IBUPROFEN 400 MG PO TABS
400.0000 mg | ORAL_TABLET | Freq: Four times a day (QID) | ORAL | 0 refills | Status: AC | PRN
  Filled 2024-01-09: qty 30, 8d supply, fill #0

## 2024-01-09 NOTE — Discharge Instructions (Signed)

## 2024-01-09 NOTE — Plan of Care (Signed)
  Problem: Education: Goal: Knowledge of General Education information will improve Description: Including pain rating scale, medication(s)/side effects and non-pharmacologic comfort measures Outcome: Progressing   Problem: Health Behavior/Discharge Planning: Goal: Ability to manage health-related needs will improve Outcome: Progressing   Problem: Clinical Measurements: Goal: Ability to maintain clinical measurements within normal limits will improve Outcome: Progressing Goal: Will remain free from infection Outcome: Progressing Goal: Diagnostic test results will improve Outcome: Progressing Goal: Respiratory complications will improve Outcome: Progressing Goal: Cardiovascular complication will be avoided Outcome: Progressing   Problem: Activity: Goal: Risk for activity intolerance will decrease Outcome: Progressing   Problem: Nutrition: Goal: Adequate nutrition will be maintained Outcome: Progressing   Problem: Coping: Goal: Level of anxiety will decrease Outcome: Progressing   Problem: Elimination: Goal: Will not experience complications related to bowel motility Outcome: Progressing Goal: Will not experience complications related to urinary retention Outcome: Progressing   Problem: Pain Managment: Goal: General experience of comfort will improve and/or be controlled Outcome: Progressing   Problem: Safety: Goal: Ability to remain free from injury will improve Outcome: Progressing   Problem: Skin Integrity: Goal: Risk for impaired skin integrity will decrease Outcome: Progressing   Problem: Education: Goal: Knowledge of condition will improve Outcome: Progressing Goal: Individualized Educational Video(s) Outcome: Progressing Goal: Individualized Newborn Educational Video(s) Outcome: Progressing   Problem: Activity: Goal: Will verbalize the importance of balancing activity with adequate rest periods Outcome: Progressing Goal: Ability to tolerate increased  activity will improve Outcome: Progressing   Problem: Coping: Goal: Ability to identify and utilize available resources and services will improve Outcome: Progressing   Problem: Life Cycle: Goal: Chance of risk for complications during the postpartum period will decrease Outcome: Progressing   Problem: Role Relationship: Goal: Ability to demonstrate positive interaction with newborn will improve Outcome: Progressing   Problem: Skin Integrity: Goal: Demonstration of wound healing without infection will improve Outcome: Progressing   Problem: Education: Goal: Knowledge of the prescribed therapeutic regimen will improve Outcome: Progressing Goal: Understanding of sexual limitations or changes related to disease process or condition will improve Outcome: Progressing Goal: Individualized Educational Video(s) Outcome: Progressing   Problem: Self-Concept: Goal: Communication of feelings regarding changes in body function or appearance will improve Outcome: Progressing   Problem: Skin Integrity: Goal: Demonstration of wound healing without infection will improve Outcome: Progressing

## 2024-01-09 NOTE — Plan of Care (Signed)
 Problem: Education: Goal: Knowledge of General Education information will improve Description: Including pain rating scale, medication(s)/side effects and non-pharmacologic comfort measures 01/09/2024 1004 by Madison Rosina LABOR, LPN Outcome: Adequate for Discharge 01/09/2024 9157 by Madison Rosina LABOR, LPN Outcome: Progressing   Problem: Health Behavior/Discharge Planning: Goal: Ability to manage health-related needs will improve 01/09/2024 1004 by Madison Rosina LABOR, LPN Outcome: Adequate for Discharge 01/09/2024 0842 by Madison Rosina LABOR, LPN Outcome: Progressing   Problem: Clinical Measurements: Goal: Ability to maintain clinical measurements within normal limits will improve 01/09/2024 1004 by Madison Rosina LABOR, LPN Outcome: Adequate for Discharge 01/09/2024 0842 by Madison Rosina LABOR, LPN Outcome: Progressing Goal: Will remain free from infection 01/09/2024 1004 by Madison Rosina LABOR, LPN Outcome: Adequate for Discharge 01/09/2024 0842 by Madison Rosina LABOR, LPN Outcome: Progressing Goal: Diagnostic test results will improve 01/09/2024 1004 by Madison Rosina LABOR, LPN Outcome: Adequate for Discharge 01/09/2024 9157 by Madison Rosina LABOR, LPN Outcome: Progressing Goal: Respiratory complications will improve 01/09/2024 1004 by Madison Rosina LABOR, LPN Outcome: Adequate for Discharge 01/09/2024 9157 by Madison Rosina LABOR, LPN Outcome: Progressing Goal: Cardiovascular complication will be avoided 01/09/2024 1004 by Madison Rosina LABOR, LPN Outcome: Adequate for Discharge 01/09/2024 9157 by Madison Rosina LABOR, LPN Outcome: Progressing   Problem: Activity: Goal: Risk for activity intolerance will decrease 01/09/2024 1004 by Madison Rosina LABOR, LPN Outcome: Adequate for Discharge 01/09/2024 9157 by Madison Rosina LABOR, LPN Outcome: Progressing   Problem: Nutrition: Goal: Adequate nutrition will be maintained 01/09/2024 1004 by Madison Rosina LABOR, LPN Outcome: Adequate for Discharge 01/09/2024 0842 by Madison Rosina LABOR, LPN Outcome:  Progressing   Problem: Coping: Goal: Level of anxiety will decrease 01/09/2024 1004 by Madison Rosina LABOR, LPN Outcome: Adequate for Discharge 01/09/2024 0842 by Madison Rosina LABOR, LPN Outcome: Progressing   Problem: Elimination: Goal: Will not experience complications related to bowel motility 01/09/2024 1004 by Madison Rosina LABOR, LPN Outcome: Adequate for Discharge 01/09/2024 0842 by Madison Rosina LABOR, LPN Outcome: Progressing Goal: Will not experience complications related to urinary retention 01/09/2024 1004 by Madison Rosina LABOR, LPN Outcome: Adequate for Discharge 01/09/2024 9157 by Madison Rosina LABOR, LPN Outcome: Progressing   Problem: Pain Managment: Goal: General experience of comfort will improve and/or be controlled 01/09/2024 1004 by Madison Rosina LABOR, LPN Outcome: Adequate for Discharge 01/09/2024 9157 by Madison Rosina LABOR, LPN Outcome: Progressing   Problem: Safety: Goal: Ability to remain free from injury will improve 01/09/2024 1004 by Madison Rosina LABOR, LPN Outcome: Adequate for Discharge 01/09/2024 0842 by Madison Rosina LABOR, LPN Outcome: Progressing   Problem: Skin Integrity: Goal: Risk for impaired skin integrity will decrease 01/09/2024 1004 by Madison Rosina LABOR, LPN Outcome: Adequate for Discharge 01/09/2024 9157 by Madison Rosina LABOR, LPN Outcome: Progressing   Problem: Education: Goal: Knowledge of condition will improve 01/09/2024 1004 by Madison Rosina LABOR, LPN Outcome: Adequate for Discharge 01/09/2024 9157 by Madison Rosina LABOR, LPN Outcome: Progressing Goal: Individualized Educational Video(s) 01/09/2024 1004 by Madison Rosina LABOR, LPN Outcome: Adequate for Discharge 01/09/2024 0842 by Madison Rosina LABOR, LPN Outcome: Progressing Goal: Individualized Newborn Educational Video(s) 01/09/2024 1004 by Madison Rosina LABOR, LPN Outcome: Adequate for Discharge 01/09/2024 0842 by Madison Rosina LABOR, LPN Outcome: Progressing   Problem: Activity: Goal: Will verbalize the importance of balancing  activity with adequate rest periods 01/09/2024 1004 by Madison Rosina LABOR, LPN Outcome: Adequate for Discharge 01/09/2024 9157 by Madison Rosina LABOR, LPN Outcome: Progressing Goal: Ability to tolerate increased activity will improve 01/09/2024 1004 by Madison Rosina LABOR, LPN  Outcome: Adequate for Discharge 01/09/2024 0842 by Madison Rosina LABOR, LPN Outcome: Progressing   Problem: Coping: Goal: Ability to identify and utilize available resources and services will improve 01/09/2024 1004 by Madison Rosina LABOR, LPN Outcome: Adequate for Discharge 01/09/2024 9157 by Madison Rosina LABOR, LPN Outcome: Progressing   Problem: Life Cycle: Goal: Chance of risk for complications during the postpartum period will decrease 01/09/2024 1004 by Madison Rosina LABOR, LPN Outcome: Adequate for Discharge 01/09/2024 0842 by Madison Rosina LABOR, LPN Outcome: Progressing   Problem: Role Relationship: Goal: Ability to demonstrate positive interaction with newborn will improve 01/09/2024 1004 by Madison Rosina LABOR, LPN Outcome: Adequate for Discharge 01/09/2024 0842 by Madison Rosina LABOR, LPN Outcome: Progressing   Problem: Skin Integrity: Goal: Demonstration of wound healing without infection will improve 01/09/2024 1004 by Madison Rosina LABOR, LPN Outcome: Adequate for Discharge 01/09/2024 9157 by Madison Rosina LABOR, LPN Outcome: Progressing   Problem: Education: Goal: Knowledge of the prescribed therapeutic regimen will improve 01/09/2024 1004 by Madison Rosina LABOR, LPN Outcome: Adequate for Discharge 01/09/2024 9157 by Madison Rosina LABOR, LPN Outcome: Progressing Goal: Understanding of sexual limitations or changes related to disease process or condition will improve 01/09/2024 1004 by Madison Rosina LABOR, LPN Outcome: Adequate for Discharge 01/09/2024 9157 by Madison Rosina LABOR, LPN Outcome: Progressing Goal: Individualized Educational Video(s) 01/09/2024 1004 by Madison Rosina LABOR, LPN Outcome: Adequate for Discharge 01/09/2024 0842 by Madison Rosina LABOR,  LPN Outcome: Progressing   Problem: Self-Concept: Goal: Communication of feelings regarding changes in body function or appearance will improve 01/09/2024 1004 by Madison Rosina LABOR, LPN Outcome: Adequate for Discharge 01/09/2024 0842 by Madison Rosina LABOR, LPN Outcome: Progressing   Problem: Skin Integrity: Goal: Demonstration of wound healing without infection will improve 01/09/2024 1004 by Madison Rosina LABOR, LPN Outcome: Adequate for Discharge 01/09/2024 0842 by Madison Rosina LABOR, LPN Outcome: Progressing

## 2024-01-09 NOTE — Lactation Note (Signed)
 This note was copied from a baby's chart. Lactation Consultation Note  Patient Name: Carolyn Joseph Unijb'd Date: 01/09/2024 Age:31 hours Reason for consult: Follow-up assessment;Early term 37-38.6wks;Infant < 6lbs  P3, Baby < 6 lbs. Mother pumping one breast when LC entered room. She pumped 16 ml and has chosen for this session to only pump one breast.  She has been pumping 30 ml with both breasts.  Mother is breastfeeding, pumping and formula feeding. [facility] referral sent for a DEBP. Reviewed engorgement care and monitoring voids/stools. Encouraged mother to pump q 3hours and wake baby for feeding q 3 hours.  Suggest calling for help as needed.   Maternal Data Does the patient have breastfeeding experience prior to this delivery?: Yes  Feeding Mother's Current Feeding Choice: Breast Milk and Formula Lactation Tools Discussed/Used Tools: Pump;Flanges Flange Size: 21 Pumping frequency: q 3 hours for 15 min Pumped volume: 30 mL  Interventions Interventions: DEBP;Education  Discharge Discharge Education: Engorgement and breast care;Warning signs for feeding baby Pump: Manual WIC Program: Yes  Consult Status Consult Status: Complete Date: 01/09/24    Shannon Dines [facility] 01/09/2024, 1:35 PM

## 2024-01-09 NOTE — Patient Instructions (Signed)
 If interested in an outpatient lactation consult in office or virtually please reach out to us  at [facility] (First Floor) [address] [place]  Please feel free to out with any lactation related questions or concerns [phone]  to leave a message for our lactation voicemail box.  Lactation support groups:  [facility], Tuesdays 10:00 am -12:00 pm at [address] on the second floor in the conference room, lactating parents and lap babies welcome.  [email or web address]  [email or web address]      Vermell CINDERELLA Pelt, [facility]

## 2024-01-10 ENCOUNTER — Encounter: Payer: Self-pay | Admitting: Certified Nurse Midwife

## 2024-01-15 ENCOUNTER — Telehealth (HOSPITAL_COMMUNITY): Payer: Self-pay | Admitting: *Deleted

## 2024-01-15 ENCOUNTER — Ambulatory Visit: Payer: Self-pay

## 2024-01-15 NOTE — Telephone Encounter (Signed)
 01/15/2024  Name: Carolyn Joseph MRN: [id number] DOB: 1990/03/28  Reason for Call:  Transition of Care Hospital Discharge Call  Contact Status: Patient Contact Status: Message  Language assistant needed:          Follow-Up Questions:    Van Postnatal Depression Scale:  In the Past 7 Days:    PHQ2-9 Depression Scale:     Discharge Follow-up:    Post-discharge interventions: NA  Steva Banter, RN 01/15/2024 [phone]

## 2024-01-17 ENCOUNTER — Encounter: Payer: Self-pay | Admitting: Family Medicine

## 2024-01-23 ENCOUNTER — Encounter: Payer: Self-pay | Admitting: Obstetrics & Gynecology

## 2024-02-25 ENCOUNTER — Ambulatory Visit: Payer: Self-pay | Admitting: Obstetrics & Gynecology
